# Patient Record
Sex: Female | Born: 1965 | Race: Black or African American | Hispanic: No | Smoking: Former smoker
Health system: Southern US, Community
[De-identification: ages and names within clinical notes are randomized; demographics above are authoritative.]

## PROBLEM LIST (undated history)

## (undated) DIAGNOSIS — E785 Hyperlipidemia, unspecified: Secondary | ICD-10-CM

## (undated) DIAGNOSIS — J40 Bronchitis, not specified as acute or chronic: Secondary | ICD-10-CM

## (undated) DIAGNOSIS — I10 Essential (primary) hypertension: Secondary | ICD-10-CM

## (undated) DIAGNOSIS — I639 Cerebral infarction, unspecified: Secondary | ICD-10-CM

## (undated) DIAGNOSIS — J45909 Unspecified asthma, uncomplicated: Secondary | ICD-10-CM

## (undated) HISTORY — PX: TUBAL LIGATION: SHX77

## (undated) HISTORY — PX: CHOLECYSTECTOMY: SHX55

---

## 2007-02-26 ENCOUNTER — Ambulatory Visit: Payer: Self-pay | Admitting: Obstetrics and Gynecology

## 2008-03-18 ENCOUNTER — Ambulatory Visit: Payer: Self-pay | Admitting: Family Medicine

## 2009-04-15 ENCOUNTER — Ambulatory Visit: Payer: Self-pay | Admitting: Family

## 2010-11-21 ENCOUNTER — Emergency Department: Payer: Self-pay | Admitting: Emergency Medicine

## 2010-12-11 ENCOUNTER — Emergency Department: Payer: Self-pay | Admitting: Emergency Medicine

## 2011-01-07 ENCOUNTER — Emergency Department: Payer: Self-pay | Admitting: *Deleted

## 2011-01-22 ENCOUNTER — Emergency Department: Payer: Self-pay | Admitting: Emergency Medicine

## 2012-01-06 ENCOUNTER — Emergency Department: Payer: Self-pay | Admitting: Emergency Medicine

## 2012-01-11 ENCOUNTER — Emergency Department: Payer: Self-pay | Admitting: Emergency Medicine

## 2012-03-26 ENCOUNTER — Emergency Department: Payer: Self-pay | Admitting: Emergency Medicine

## 2012-06-20 ENCOUNTER — Emergency Department: Payer: Self-pay | Admitting: Emergency Medicine

## 2012-06-20 LAB — CBC
MCHC: 34.5 g/dL (ref 32.0–36.0)
RBC: 3.78 10*6/uL — ABNORMAL LOW (ref 3.80–5.20)
RDW: 12.1 % (ref 11.5–14.5)
WBC: 8 10*3/uL (ref 3.6–11.0)

## 2012-06-20 LAB — COMPREHENSIVE METABOLIC PANEL
Albumin: 3.6 g/dL (ref 3.4–5.0)
Anion Gap: 6 — ABNORMAL LOW (ref 7–16)
BUN: 8 mg/dL (ref 7–18)
Bilirubin,Total: 0.3 mg/dL (ref 0.2–1.0)
Chloride: 106 mmol/L (ref 98–107)
Co2: 26 mmol/L (ref 21–32)
Creatinine: 0.85 mg/dL (ref 0.60–1.30)
EGFR (Non-African Amer.): >60
Potassium: 3.2 mmol/L — ABNORMAL LOW (ref 3.5–5.1)
SGPT (ALT): 10 U/L — ABNORMAL LOW (ref 12–78)
Total Protein: 7.1 g/dL (ref 6.4–8.2)

## 2012-06-20 LAB — URINALYSIS, COMPLETE
Bacteria: NONE SEEN
Bilirubin,UR: NEGATIVE
Ketone: NEGATIVE
Nitrite: NEGATIVE
Ph: 6 (ref 4.5–8.0)
RBC,UR: 5 /HPF (ref 0–5)
WBC UR: 1 /HPF (ref 0–5)

## 2012-06-20 LAB — CK TOTAL AND CKMB (NOT AT ARMC)
CK, Total: 124 U/L (ref 21–215)
CK-MB: 1.1 ng/mL (ref 0.5–3.6)

## 2012-06-20 LAB — TROPONIN I: Troponin-I: <0.02 ng/mL

## 2012-06-21 LAB — TSH: Thyroid Stimulating Horm: 1.26 u[IU]/mL

## 2012-09-18 ENCOUNTER — Emergency Department: Payer: Self-pay | Admitting: Emergency Medicine

## 2012-09-18 LAB — COMPREHENSIVE METABOLIC PANEL
Albumin: 4 g/dL (ref 3.4–5.0)
BUN: 9 mg/dL (ref 7–18)
Bilirubin,Total: 0.4 mg/dL (ref 0.2–1.0)
Co2: 33 mmol/L — ABNORMAL HIGH (ref 21–32)
EGFR (African American): >60
EGFR (Non-African Amer.): >60
Osmolality: 277 (ref 275–301)
Potassium: 2.7 mmol/L — ABNORMAL LOW (ref 3.5–5.1)
SGOT(AST): 25 U/L (ref 15–37)
Total Protein: 8 g/dL (ref 6.4–8.2)

## 2012-09-18 LAB — URINALYSIS, COMPLETE
Bilirubin,UR: NEGATIVE
Leukocyte Esterase: NEGATIVE
Nitrite: NEGATIVE
Ph: 7 (ref 4.5–8.0)
Protein: NEGATIVE
RBC,UR: 10 /HPF (ref 0–5)
Squamous Epithelial: 7
WBC UR: 4 /HPF (ref 0–5)

## 2012-09-18 LAB — CBC
HCT: 41.1 % (ref 35.0–47.0)
HGB: 14.3 g/dL (ref 12.0–16.0)
MCHC: 34.9 g/dL (ref 32.0–36.0)
MCV: 96 fL (ref 80–100)
Platelet: 248 10*3/uL (ref 150–440)
RBC: 4.29 10*6/uL (ref 3.80–5.20)

## 2013-04-20 ENCOUNTER — Emergency Department: Payer: Self-pay | Admitting: Internal Medicine

## 2013-04-20 LAB — RAPID INFLUENZA A&B ANTIGENS (ARMC ONLY)

## 2013-09-29 ENCOUNTER — Emergency Department: Payer: Self-pay | Admitting: Emergency Medicine

## 2013-10-11 ENCOUNTER — Emergency Department: Payer: Self-pay | Admitting: Emergency Medicine

## 2014-03-19 ENCOUNTER — Emergency Department: Payer: Self-pay | Admitting: Emergency Medicine

## 2014-03-19 LAB — CBC WITH DIFFERENTIAL/PLATELET
BASOS PCT: 0.5 %
Basophil #: 0 10*3/uL (ref 0.0–0.1)
EOS ABS: 0.1 10*3/uL (ref 0.0–0.7)
EOS PCT: 0.8 %
HCT: 37.8 % (ref 35.0–47.0)
HGB: 12.7 g/dL (ref 12.0–16.0)
Lymphocyte #: 2.7 10*3/uL (ref 1.0–3.6)
Lymphocyte %: 36.7 %
MCH: 33.3 pg (ref 26.0–34.0)
MCHC: 33.6 g/dL (ref 32.0–36.0)
MCV: 99 fL (ref 80–100)
MONO ABS: 0.5 x10 3/mm (ref 0.2–0.9)
Monocyte %: 7 %
NEUTROS ABS: 4.1 10*3/uL (ref 1.4–6.5)
NEUTROS PCT: 55 %
Platelet: 191 10*3/uL (ref 150–440)
RBC: 3.81 10*6/uL (ref 3.80–5.20)
RDW: 12.3 % (ref 11.5–14.5)
WBC: 7.4 10*3/uL (ref 3.6–11.0)

## 2014-03-19 LAB — URINALYSIS, COMPLETE
BILIRUBIN, UR: NEGATIVE
Bacteria: NONE SEEN
Glucose,UR: NEGATIVE mg/dL (ref 0–75)
Ketone: NEGATIVE
LEUKOCYTE ESTERASE: NEGATIVE
Nitrite: NEGATIVE
PROTEIN: NEGATIVE
Ph: 6 (ref 4.5–8.0)
RBC,UR: 15 /HPF (ref 0–5)
Specific Gravity: 1.02 (ref 1.003–1.030)
WBC UR: 1 /HPF (ref 0–5)

## 2014-03-19 LAB — BASIC METABOLIC PANEL
Anion Gap: 3 — ABNORMAL LOW (ref 7–16)
BUN: 10 mg/dL (ref 7–18)
CHLORIDE: 109 mmol/L — AB (ref 98–107)
CREATININE: 0.8 mg/dL (ref 0.60–1.30)
Calcium, Total: 8.7 mg/dL (ref 8.5–10.1)
Co2: 29 mmol/L (ref 21–32)
EGFR (African American): >60
EGFR (Non-African Amer.): >60
Glucose: 89 mg/dL (ref 65–99)
Osmolality: 280 (ref 275–301)
POTASSIUM: 3.8 mmol/L (ref 3.5–5.1)
Sodium: 141 mmol/L (ref 136–145)

## 2014-07-11 ENCOUNTER — Encounter: Payer: Self-pay | Admitting: Emergency Medicine

## 2014-07-11 ENCOUNTER — Emergency Department: Payer: No Typology Code available for payment source

## 2014-07-11 ENCOUNTER — Emergency Department
Admission: EM | Admit: 2014-07-11 | Discharge: 2014-07-11 | Disposition: A | Discharge status: Home / Self Care | Payer: No Typology Code available for payment source | Attending: Emergency Medicine | Admitting: Emergency Medicine

## 2014-07-11 DIAGNOSIS — Z79899 Other long term (current) drug therapy: Secondary | ICD-10-CM | POA: Diagnosis not present

## 2014-07-11 DIAGNOSIS — M25562 Pain in left knee: Secondary | ICD-10-CM

## 2014-07-11 DIAGNOSIS — Z87891 Personal history of nicotine dependence: Secondary | ICD-10-CM | POA: Insufficient documentation

## 2014-07-11 DIAGNOSIS — Z791 Long term (current) use of non-steroidal anti-inflammatories (NSAID): Secondary | ICD-10-CM | POA: Insufficient documentation

## 2014-07-11 MED ORDER — OXYCODONE-ACETAMINOPHEN 5-325 MG PO TABS: 1.0000 | ORAL_TABLET | Freq: Three times a day (TID) | ORAL | Status: DC | PRN

## 2014-07-11 MED ORDER — OXYCODONE-ACETAMINOPHEN 5-325 MG PO TABS
1.0000 | ORAL_TABLET | Freq: Once | ORAL | Status: AC
  Administered 2014-07-11: 1 via ORAL

## 2014-07-11 MED ORDER — IBUPROFEN 800 MG PO TABS: 800.0000 mg | ORAL_TABLET | Freq: Three times a day (TID) | ORAL | Status: DC | PRN

## 2014-07-11 MED ORDER — OXYCODONE-ACETAMINOPHEN 5-325 MG PO TABS
ORAL_TABLET | ORAL | Status: AC
  Administered 2014-07-11: 1 via ORAL
  Filled 2014-07-11: qty 1
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
## 2014-07-11 NOTE — Discharge Instructions (Signed)
Take medication as prescribed. Keep knee in an immobilizer and use crutches. Apply ice and elevate. Rest.  Follow-up orthopedic next week. See above to call to schedule.  Gen. the ER for new or worsening concerns.  Knee Pain The knee is the complex joint between your thigh and your lower leg. It is made up of bones, tendons, ligaments, and cartilage. The bones that make up the knee are:  The femur in the thigh.  The tibia and fibula in the lower leg.  The patella or kneecap riding in the groove on the lower femur. CAUSES  Knee pain is a common complaint with many causes. A few of these causes are:  Injury, such as:  A ruptured ligament or tendon injury.  Torn cartilage.  Medical conditions, such as:  Gout  Arthritis  Infections  Overuse, over training, or overdoing a physical activity. Knee pain can be minor or severe. Knee pain can accompany debilitating injury. Minor knee problems often respond well to self-care measures or get well on their own. More serious injuries may need medical intervention or even surgery. SYMPTOMS The knee is complex. Symptoms of knee problems can vary widely. Some of the problems are:  Pain with movement and weight bearing.  Swelling and tenderness.  Buckling of the knee.  Inability to straighten or extend your knee.  Your knee locks and you cannot straighten it.  Warmth and redness with pain and fever.  Deformity or dislocation of the kneecap. DIAGNOSIS  Determining what is wrong may be very straight forward such as when there is an injury. It can also be challenging because of the complexity of the knee. Tests to make a diagnosis may include:  Your caregiver taking a history and doing a physical exam.  Routine X-rays can be used to rule out other problems. X-rays will not reveal a cartilage tear. Some injuries of the knee can be diagnosed by:  Arthroscopy a surgical technique by which a small video camera is inserted through tiny  incisions on the sides of the knee. This procedure is used to examine and repair internal knee joint problems. Tiny instruments can be used during arthroscopy to repair the torn knee cartilage (meniscus).  Arthrography is a radiology technique. A contrast liquid is directly injected into the knee joint. Internal structures of the knee joint then become visible on X-ray film.  An MRI scan is a non X-ray radiology procedure in which magnetic fields and a computer produce two- or three-dimensional images of the inside of the knee. Cartilage tears are often visible using an MRI scanner. MRI scans have largely replaced arthrography in diagnosing cartilage tears of the knee.  Blood work.  Examination of the fluid that helps to lubricate the knee joint (synovial fluid). This is done by taking a sample out using a needle and a syringe. TREATMENT The treatment of knee problems depends on the cause. Some of these treatments are:  Depending on the injury, proper casting, splinting, surgery, or physical therapy care will be needed.  Give yourself adequate recovery time. Do not overuse your joints. If you begin to get sore during workout routines, back off. Slow down or do fewer repetitions.  For repetitive activities such as cycling or running, maintain your strength and nutrition.  Alternate muscle groups. For example, if you are a weight lifter, work the upper body on one day and the lower body the next.  Either tight or weak muscles do not give the proper support for your knee. Tight  or weak muscles do not absorb the stress placed on the knee joint. Keep the muscles surrounding the knee strong.  Take care of mechanical problems.  If you have flat feet, orthotics or special shoes may help. See your caregiver if you need help.  Arch supports, sometimes with wedges on the inner or outer aspect of the heel, can help. These can shift pressure away from the side of the knee most bothered by  osteoarthritis.  A brace called an "unloader" brace also may be used to help ease the pressure on the most arthritic side of the knee.  If your caregiver has prescribed crutches, braces, wraps or ice, use as directed. The acronym for this is PRICE. This means protection, rest, ice, compression, and elevation.  Nonsteroidal anti-inflammatory drugs (NSAIDs), can help relieve pain. But if taken immediately after an injury, they may actually increase swelling. Take NSAIDs with food in your stomach. Stop them if you develop stomach problems. Do not take these if you have a history of ulcers, stomach pain, or bleeding from the bowel. Do not take without your caregiver's approval if you have problems with fluid retention, heart failure, or kidney problems.  For ongoing knee problems, physical therapy may be helpful.  Glucosamine and chondroitin are over-the-counter dietary supplements. Both may help relieve the pain of osteoarthritis in the knee. These medicines are different from the usual anti-inflammatory drugs. Glucosamine may decrease the rate of cartilage destruction.  Injections of a corticosteroid drug into your knee joint may help reduce the symptoms of an arthritis flare-up. They may provide pain relief that lasts a few months. You may have to wait a few months between injections. The injections do have a small increased risk of infection, water retention, and elevated blood sugar levels.  Hyaluronic acid injected into damaged joints may ease pain and provide lubrication. These injections may work by reducing inflammation. A series of shots may give relief for as long as 6 months.  Topical painkillers. Applying certain ointments to your skin may help relieve the pain and stiffness of osteoarthritis. Ask your pharmacist for suggestions. Many over the-counter products are approved for temporary relief of arthritis pain.  In some countries, doctors often prescribe topical NSAIDs for relief of  chronic conditions such as arthritis and tendinitis. A review of treatment with NSAID creams found that they worked as well as oral medications but without the serious side effects. PREVENTION  Maintain a healthy weight. Extra pounds put more strain on your joints.  Get strong, stay limber. Weak muscles are a common cause of knee injuries. Stretching is important. Include flexibility exercises in your workouts.  Be smart about exercise. If you have osteoarthritis, chronic knee pain or recurring injuries, you may need to change the way you exercise. This does not mean you have to stop being active. If your knees ache after jogging or playing basketball, consider switching to swimming, water aerobics, or other low-impact activities, at least for a few days a week. Sometimes limiting high-impact activities will provide relief.  Make sure your shoes fit well. Choose footwear that is right for your sport.  Protect your knees. Use the proper gear for knee-sensitive activities. Use kneepads when playing volleyball or laying carpet. Buckle your seat belt every time you drive. Most shattered kneecaps occur in car accidents.  Rest when you are tired. SEEK MEDICAL CARE IF:  You have knee pain that is continual and does not seem to be getting better.  SEEK IMMEDIATE MEDICAL CARE IF:  Your knee joint feels hot to the touch and you have a high fever. MAKE SURE YOU:   Understand these instructions.  Will watch your condition.  Will get help right away if you are not doing well or get worse. Document Released: 12/12/2006 Document Revised: 05/09/2011 Document Reviewed: 12/12/2006 [facility] Patient Information 2015 [place], [place]. This information is not intended to replace advice given to you by your health care provider. Make sure you discuss any questions you have with your health care provider.

## 2014-07-11 NOTE — ED Notes (Signed)
C/o left knee pain, denies any injury, sates she has pain off and on to left knee but worse today

## 2015-04-21 ENCOUNTER — Emergency Department
Admission: EM | Admit: 2015-04-21 | Discharge: 2015-04-21 | Disposition: A | Discharge status: Home / Self Care | Payer: No Typology Code available for payment source | Attending: Emergency Medicine | Admitting: Emergency Medicine

## 2015-04-21 ENCOUNTER — Encounter: Payer: Self-pay | Admitting: Emergency Medicine

## 2015-04-21 DIAGNOSIS — Z87891 Personal history of nicotine dependence: Secondary | ICD-10-CM | POA: Insufficient documentation

## 2015-04-21 DIAGNOSIS — R112 Nausea with vomiting, unspecified: Secondary | ICD-10-CM

## 2015-04-21 DIAGNOSIS — R197 Diarrhea, unspecified: Secondary | ICD-10-CM

## 2015-04-21 DIAGNOSIS — B349 Viral infection, unspecified: Secondary | ICD-10-CM | POA: Insufficient documentation

## 2015-04-21 DIAGNOSIS — Z79899 Other long term (current) drug therapy: Secondary | ICD-10-CM | POA: Insufficient documentation

## 2015-04-21 LAB — COMPREHENSIVE METABOLIC PANEL
ALBUMIN: 4 g/dL (ref 3.5–5.0)
ALT: 9 U/L — AB (ref 14–54)
AST: 20 U/L (ref 15–41)
Alkaline Phosphatase: 97 U/L (ref 38–126)
Anion gap: 5 (ref 5–15)
BUN: 8 mg/dL (ref 6–20)
CO2: 28 mmol/L (ref 22–32)
CREATININE: 0.92 mg/dL (ref 0.44–1.00)
Calcium: 9.1 mg/dL (ref 8.9–10.3)
Chloride: 105 mmol/L (ref 101–111)
GFR calc Af Amer: >60 mL/min (ref >60)
Glucose, Bld: 108 mg/dL — ABNORMAL HIGH (ref 65–99)
Potassium: 3.1 mmol/L — ABNORMAL LOW (ref 3.5–5.1)
Sodium: 138 mmol/L (ref 135–145)
Total Bilirubin: 0.5 mg/dL (ref 0.3–1.2)
Total Protein: 7.3 g/dL (ref 6.5–8.1)

## 2015-04-21 LAB — CBC
HEMATOCRIT: 36.9 % (ref 35.0–47.0)
Hemoglobin: 13.1 g/dL (ref 12.0–16.0)
MCH: 33.8 pg (ref 26.0–34.0)
MCHC: 35.5 g/dL (ref 32.0–36.0)
MCV: 95.3 fL (ref 80.0–100.0)
PLATELETS: 196 10*3/uL (ref 150–440)
RBC: 3.87 MIL/uL (ref 3.80–5.20)
RDW: 12 % (ref 11.5–14.5)
WBC: 4.8 10*3/uL (ref 3.6–11.0)

## 2015-04-21 LAB — URINALYSIS COMPLETE WITH MICROSCOPIC (ARMC ONLY)
Bilirubin Urine: NEGATIVE
GLUCOSE, UA: NEGATIVE mg/dL
Ketones, ur: NEGATIVE mg/dL
LEUKOCYTES UA: NEGATIVE
Nitrite: NEGATIVE
Protein, ur: NEGATIVE mg/dL
Specific Gravity, Urine: 1.017 (ref 1.005–1.030)
pH: 7 (ref 5.0–8.0)

## 2015-04-21 LAB — LIPASE, BLOOD: LIPASE: 24 U/L (ref 11–51)

## 2015-04-21 MED ORDER — POTASSIUM CHLORIDE CRYS ER 20 MEQ PO TBCR
40.0000 meq | EXTENDED_RELEASE_TABLET | Freq: Once | ORAL | Status: AC
  Administered 2015-04-21: 40 meq via ORAL
  Filled 2015-04-21: qty 2

## 2015-04-21 MED ORDER — ONDANSETRON HCL 4 MG PO TABS: ORAL_TABLET | ORAL | Status: DC

## 2015-04-21 MED ORDER — POTASSIUM CHLORIDE CRYS ER 20 MEQ PO TBCR: 20.0000 meq | EXTENDED_RELEASE_TABLET | Freq: Every day | ORAL | Status: DC

## 2015-04-21 NOTE — Discharge Instructions (Signed)
You have been seen in the Emergency Department (ED) today for a likely viral illness.  Please drink plenty of clear fluids (water, Gatorade, chicken broth, etc).  You may use Tylenol and/or Motrin according to label instructions.  You can alternate between the two without any side effects.   Take anti-nausea medication as needed.    Please follow up with your doctor as listed above.  Call your doctor or return to the Emergency Department (ED) if you are unable to tolerate fluids due to vomiting, have worsening trouble breathing, become extremely tired or difficult to awaken, or if you develop any other symptoms that concern you.   Viral Infections A viral infection can be caused by different types of viruses.Most viral infections are not serious and resolve on their own. However, some infections may cause severe symptoms and may lead to further complications. SYMPTOMS Viruses can frequently cause:  Minor sore throat.  Aches and pains.  Headaches.  Runny nose.  Different types of rashes.  Watery eyes.  Tiredness.  Cough.  Loss of appetite.  Gastrointestinal infections, resulting in nausea, vomiting, and diarrhea. These symptoms do not respond to antibiotics because the infection is not caused by bacteria. However, you might catch a bacterial infection following the viral infection. This is sometimes called a "superinfection." Symptoms of such a bacterial infection may include:  Worsening sore throat with pus and difficulty swallowing.  Swollen neck glands.  Chills and a high or persistent fever.  Severe headache.  Tenderness over the sinuses.  Persistent overall ill feeling (malaise), muscle aches, and tiredness (fatigue).  Persistent cough.  Yellow, green, or brown mucus production with coughing. HOME CARE INSTRUCTIONS   Only take over-the-counter or prescription medicines for pain, discomfort, diarrhea, or fever as directed by your caregiver.  Drink enough water  and fluids to keep your urine clear or pale yellow. Sports drinks can provide valuable electrolytes, sugars, and hydration.  Get plenty of rest and maintain proper nutrition. Soups and broths with crackers or rice are fine. SEEK IMMEDIATE MEDICAL CARE IF:   You have severe headaches, shortness of breath, chest pain, neck pain, or an unusual rash.  You have uncontrolled vomiting, diarrhea, or you are unable to keep down fluids.  You or your child has an oral temperature above 102 F (38.9 C), not controlled by medicine.  Your baby is older than 3 months with a rectal temperature of 102 F (38.9 C) or higher.  Your baby is 36 months old or younger with a rectal temperature of 100.4 F (38 C) or higher. MAKE SURE YOU:   Understand these instructions.  Will watch your condition.  Will get help right away if you are not doing well or get worse.   This information is not intended to replace advice given to you by your health care provider. Make sure you discuss any questions you have with your health care provider.   Document Released: 11/24/2004 Document Revised: 05/09/2011 Document Reviewed: 07/23/2014 Elsevier Interactive Patient Education 2016 Else[employer] Inc[employer]  Nausea and Vomiting Nausea is a sick feeling that often comes before throwing up (vomiting). Vomiting is a reflex where stomach contents come out of your mouth. Vomiting can cause severe loss of body fluids (dehydration). Children and elderly adults can become dehydrated quickly, especially if they also have diarrhea. Nausea and vomiting are symptoms of a condition or disease. It is important to find the cause of your symptoms. CAUSES   Direct irritation of the stomach lining. This irritation can  result from increased acid production (gastroesophageal reflux disease), infection, food poisoning, taking certain medicines (such as nonsteroidal anti-inflammatory drugs), alcohol use, or tobacco use.  Signals from the brain.These  signals could be caused by a headache, heat exposure, an inner ear disturbance, increased pressure in the brain from injury, infection, a tumor, or a concussion, pain, emotional stimulus, or metabolic problems.  An obstruction in the gastrointestinal tract (bowel obstruction).  Illnesses such as diabetes, hepatitis, gallbladder problems, appendicitis, kidney problems, cancer, sepsis, atypical symptoms of a heart attack, or eating disorders.  Medical treatments such as chemotherapy and radiation.  Receiving medicine that makes you sleep (general anesthetic) during surgery. DIAGNOSIS Your caregiver may ask for tests to be done if the problems do not improve after a few days. Tests may also be done if symptoms are severe or if the reason for the nausea and vomiting is not clear. Tests may include:  Urine tests.  Blood tests.  Stool tests.  Cultures (to look for evidence of infection).  X-rays or other imaging studies. Test results can help your caregiver make decisions about treatment or the need for additional tests. TREATMENT You need to stay well hydrated. Drink frequently but in small amounts.You may wish to drink water, sports drinks, clear broth, or eat frozen ice pops or gelatin dessert to help stay hydrated.When you eat, eating slowly may help prevent nausea.There are also some antinausea medicines that may help prevent nausea. HOME CARE INSTRUCTIONS   Take all medicine as directed by your caregiver.  If you do not have an appetite, do not force yourself to eat. However, you must continue to drink fluids.  If you have an appetite, eat a normal diet unless your caregiver tells you differently.  Eat a variety of complex carbohydrates (rice, wheat, potatoes, bread), lean meats, yogurt, fruits, and vegetables.  Avoid high-fat foods because they are more difficult to digest.  Drink enough water and fluids to keep your urine clear or pale yellow.  If you are dehydrated, ask  your caregiver for specific rehydration instructions. Signs of dehydration may include:  Severe thirst.  Dry lips and mouth.  Dizziness.  Dark urine.  Decreasing urine frequency and amount.  Confusion.  Rapid breathing or pulse. SEEK IMMEDIATE MEDICAL CARE IF:   You have blood or brown flecks (like coffee grounds) in your vomit.  You have black or bloody stools.  You have a severe headache or stiff neck.  You are confused.  You have severe abdominal pain.  You have chest pain or trouble breathing.  You do not urinate at least once every 8 hours.  You develop cold or clammy skin.  You continue to vomit for longer than 24 to 48 hours.  You have a fever. MAKE SURE YOU:   Understand these instructions.  Will watch your condition.  Will get help right away if you are not doing well or get worse.   This information is not intended to replace advice given to you by your health care provider. Make sure you discuss any questions you have with your health care provider.   Document Released: 02/14/2005 Document Revised: 05/09/2011 Document Reviewed: 07/14/2010 Elsevier Interactive Patient Education 2016 Elsevier Inc.  Diarrhea Diarrhea is frequent loose and watery bowel movements. It can cause you to feel weak and dehydrated. Dehydration can cause you to become tired and thirsty, have a dry mouth, and have decreased urination that often is dark yellow. Diarrhea is a sign of another problem, most often an infection  that will not last long. In most cases, diarrhea typically lasts 2-3 days. However, it can last longer if it is a sign of something more serious. It is important to treat your diarrhea as directed by your caregiver to lessen or prevent future episodes of diarrhea. CAUSES  Some common causes include:  Gastrointestinal infections caused by viruses, bacteria, or parasites.  Food poisoning or food allergies.  Certain medicines, such as antibiotics, chemotherapy,  and laxatives.  Artificial sweeteners and fructose.  Digestive disorders. HOME CARE INSTRUCTIONS  Ensure adequate fluid intake (hydration): Have 1 cup (8 oz) of fluid for each diarrhea episode. Avoid fluids that contain simple sugars or sports drinks, fruit juices, whole milk products, and sodas. Your urine should be clear or pale yellow if you are drinking enough fluids. Hydrate with an oral rehydration solution that you can purchase at pharmacies, retail stores, and online. You can prepare an oral rehydration solution at home by mixing the following ingredients together:   - tsp table salt.   tsp baking soda.   tsp salt substitute containing potassium chloride.  1  tablespoons sugar.  1 L (34 oz) of water.  Certain foods and beverages may increase the speed at which food moves through the gastrointestinal (GI) tract. These foods and beverages should be avoided and include:  Caffeinated and alcoholic beverages.  High-fiber foods, such as raw fruits and vegetables, nuts, seeds, and whole grain breads and cereals.  Foods and beverages sweetened with sugar alcohols, such as xylitol, sorbitol, and mannitol.  Some foods may be well tolerated and may help thicken stool including:  Starchy foods, such as rice, toast, pasta, low-sugar cereal, oatmeal, grits, baked potatoes, crackers, and bagels.  Bananas.  Applesauce.  Add probiotic-rich foods to help increase healthy bacteria in the GI tract, such as yogurt and fermented milk products.  Wash your hands well after each diarrhea episode.  Only take over-the-counter or prescription medicines as directed by your caregiver.  Take a warm bath to relieve any burning or pain from frequent diarrhea episodes. SEEK IMMEDIATE MEDICAL CARE IF:   You are unable to keep fluids down.  You have persistent vomiting.  You have blood in your stool, or your stools are black and tarry.  You do not urinate in 6-8 hours, or there is only a small  amount of very dark urine.  You have abdominal pain that increases or localizes.  You have weakness, dizziness, confusion, or light-headedness.  You have a severe headache.  Your diarrhea gets worse or does not get better.  You have a fever or persistent symptoms for more than 2-3 days.  You have a fever and your symptoms suddenly get worse. MAKE SURE YOU:   Understand these instructions.  Will watch your condition.  Will get help right away if you are not doing well or get worse.   This information is not intended to replace advice given to you by your health care provider. Make sure you discuss any questions you have with your health care provider.   Document Released: 02/04/2002 Document Revised: 03/07/2014 Document Reviewed: 10/23/2011 Elsevier Interactive Patient Education [employer]

## 2015-04-21 NOTE — ED Notes (Signed)
Pt pt presents to ED with frequent vomiting and diarrhea X4 and generalized body aches. Pt reports onset of symptoms was around 1500 this afternoon.

## 2015-04-21 NOTE — ED Provider Notes (Signed)
[facility] Emergency Department Provider Note  ____________________________________________  Time seen: Approximately 6:47 AM  I have reviewed the triage vital signs and the nursing notes.   HISTORY  Chief Complaint Emesis    HPI Patricia [name] is a 50 y.o. female with no significant past medical history who presents with less than 24 hours of gradual onset of generalized body aches, subjective fever, and multiple episodes of vomiting and diarrhea (4).  She also states that she feels some nasal congestion and has a mild nonproductive cough, but that is not unusual for her because she is a former smoker and frequently gets bronchitis.  She has had a mild global headache with no sensitivity to light and no visual disturbances.  She has no neck pain or stiffness.  She denies chest pain, abdominal pain, dysuria.  Nothing makes her symptoms better and nothing makes them worse.Multiple people around her have been ill recently with similar symptoms.  She does not ever get the influenza vaccination per her own report.   History reviewed. No pertinent past medical history.  There are no active problems to display for this patient.   Past Surgical History  Procedure Laterality Date  . Cholecystectomy    . Tubal ligation      Current Outpatient Rx  Name  Route  Sig  Dispense  Refill  . hydrochlorothiazide (HYDRODIURIL) 12.5 MG tablet   Oral   Take 12.5 mg by mouth daily.          Marland Kitchen ibuprofen (ADVIL,MOTRIN) 800 MG tablet   Oral   Take 1 tablet (800 mg total) by mouth every 8 (eight) hours as needed for mild pain or moderate pain.   15 tablet   0   . ondansetron (ZOFRAN) 4 MG tablet      Take 1-2 tabs by mouth every 8 hours as needed for nausea/vomiting   30 tablet   0   . oxyCODONE-acetaminophen (ROXICET) 5-325 MG per tablet   Oral   Take 1 tablet by mouth every 8 (eight) hours as needed for moderate pain or severe pain (Do not drive or operate heavy  machinery while taking as can cause drowsiness.).   9 tablet   0   . potassium chloride SA (KLOR-CON M20) 20 MEQ tablet   Oral   Take 1 tablet (20 mEq total) by mouth daily.   7 tablet   0     Allergies Review of patient's allergies indicates no known allergies.  No family history on file.  Social History Social History  Substance Use Topics  . Smoking status: Former Games developer  . Smokeless tobacco: None  . Alcohol Use: Yes    Review of Systems Constitutional: Subjective fever/chills with general myalgias and malaise Eyes: No visual changes. ENT: No sore throat.  Mild congestion Cardiovascular: Denies chest pain. Respiratory: Denies shortness of breath.  Occasional mild nonproductive cough Gastrointestinal: No abdominal pain.  4 episodes of both vomiting and diarrhea since yesterday afternoon.  No constipation. Genitourinary: Negative for dysuria. Musculoskeletal: Negative for back pain. Skin: Negative for rash. Neurological: Negative for headaches, focal weakness or numbness.  General malaise and fatigue  10-point ROS otherwise negative.  ____________________________________________   PHYSICAL EXAM:  VITAL SIGNS: ED Triage Vitals  Enc Vitals Group     BP 04/21/15 0056 108/77 mmHg     Pulse Rate 04/21/15 0056 98     Resp 04/21/15 0056 20     Temp 04/21/15 0056 98.5 F (36.9 C)  Temp Source 04/21/15 0056 Oral     SpO2 04/21/15 0056 98 %     Weight 04/21/15 0056 176 lb (79.833 kg)     Height 04/21/15 0056  (1.651 m)     Head Cir --      Peak Flow --      Pain Score 04/21/15 0057 10     Pain Loc --      Pain Edu? --      Excl. in GC? --     Constitutional: Alert and oriented.  Appears uncomfortable but nontoxic and in no acute distress Eyes: Conjunctivae are normal. PERRL. EOMI. Head: Atraumatic. Nose: Mild congestion/rhinnorhea. Mouth/Throat: Mucous membranes are moist.  Oropharynx non-erythematous. Neck: No stridor.  No meningeal  signs Cardiovascular: Normal rate, regular rhythm. Grossly normal heart sounds.  Good peripheral circulation. Respiratory: Normal respiratory effort.  No retractions. Lungs CTAB. Gastrointestinal: Soft and nontender. No distention. No abdominal bruits. No CVA tenderness. Musculoskeletal: No lower extremity tenderness nor edema.  No joint effusions. Neurologic:  Normal speech and language. No gross focal neurologic deficits are appreciated.  Skin:  Skin is warm, dry and intact. No rash noted.   ____________________________________________   LABS (all labs ordered are listed, but only abnormal results are displayed)  Labs Reviewed  COMPREHENSIVE METABOLIC PANEL - Abnormal; Notable for the following:    Potassium 3.1 (*)    Glucose, Bld 108 (*)    ALT 9 (*)    All other components within normal limits  URINALYSIS COMPLETEWITH MICROSCOPIC ([facility] ONLY) - Abnormal; Notable for the following:    Color, Urine AMBER (*)    APPearance CLEAR (*)    Hgb urine dipstick 2+ (*)    Bacteria, UA RARE (*)    Squamous Epithelial / LPF 0-5 (*)    All other components within normal limits  LIPASE, BLOOD  CBC   ____________________________________________  EKG  None ____________________________________________  RADIOLOGY   No results found.  ____________________________________________   PROCEDURES  Procedure(s) performed: None  Critical Care performed: No ____________________________________________   INITIAL IMPRESSION / ASSESSMENT AND PLAN / ED COURSE  Pertinent labs & imaging results that were available during my care of the patient were reviewed by me and considered in my medical decision making (see chart for details).  Her labs are reassuring except for a low potassium which is likely due to GI losses.  I believe that she has a viral symptom, most likely a respiratory virus that is also causing gastroenteritis.  She has not had any episodes of vomiting or diarrhea during  approximately 6 hours in the emergency department.  She is able to tolerate by mouth intake.  I repleted her potassium with 40 mEq of potassium chloride and gave her prescription for a few more doses in case her symptoms continue.  I stressed the importance of conservative outpatient management with plenty of IV fluids, over-the-counter analgesia and antipyretics as needed, and close outpatient follow-up.   I gave my usual and customary return precautions.   She understands and agrees with the plan.  ____________________________________________  FINAL CLINICAL IMPRESSION(S) / ED DIAGNOSES  Final diagnoses:  Viral syndrome  Nausea and vomiting, vomiting of unspecified type  Diarrhea, unspecified type      NEW MEDICATIONS STARTED DURING THIS VISIT:  Discharge Medication List as of 04/21/2015  7:12 AM    START taking these medications   Details  ondansetron (ZOFRAN) 4 MG tablet Take 1-2 tabs by mouth every 8 hours as needed for  nausea/vomiting, Print    potassium chloride SA (KLOR-CON M20) 20 MEQ tablet Take 1 tablet (20 mEq total) by mouth daily., Starting 04/21/2015, Until Discontinued, Print         Loleta Rose, MD 04/21/15 1013

## 2015-04-23 ENCOUNTER — Encounter: Payer: Self-pay | Admitting: Emergency Medicine

## 2015-04-23 ENCOUNTER — Emergency Department
Admission: EM | Admit: 2015-04-23 | Discharge: 2015-04-23 | Disposition: A | Discharge status: Home / Self Care | Payer: Self-pay | Attending: Emergency Medicine | Admitting: Emergency Medicine

## 2015-04-23 ENCOUNTER — Emergency Department: Payer: Self-pay

## 2015-04-23 DIAGNOSIS — E876 Hypokalemia: Secondary | ICD-10-CM | POA: Insufficient documentation

## 2015-04-23 DIAGNOSIS — I10 Essential (primary) hypertension: Secondary | ICD-10-CM | POA: Insufficient documentation

## 2015-04-23 DIAGNOSIS — J45909 Unspecified asthma, uncomplicated: Secondary | ICD-10-CM | POA: Insufficient documentation

## 2015-04-23 DIAGNOSIS — J069 Acute upper respiratory infection, unspecified: Secondary | ICD-10-CM | POA: Insufficient documentation

## 2015-04-23 DIAGNOSIS — F17200 Nicotine dependence, unspecified, uncomplicated: Secondary | ICD-10-CM | POA: Insufficient documentation

## 2015-04-23 DIAGNOSIS — Z79899 Other long term (current) drug therapy: Secondary | ICD-10-CM | POA: Insufficient documentation

## 2015-04-23 HISTORY — DX: Essential (primary) hypertension: I10

## 2015-04-23 HISTORY — DX: Unspecified asthma, uncomplicated: J45.909

## 2015-04-23 LAB — BASIC METABOLIC PANEL
ANION GAP: 9 (ref 5–15)
BUN: 14 mg/dL (ref 6–20)
CO2: 26 mmol/L (ref 22–32)
Calcium: 9 mg/dL (ref 8.9–10.3)
Chloride: 101 mmol/L (ref 101–111)
Creatinine, Ser: 0.88 mg/dL (ref 0.44–1.00)
GFR calc Af Amer: >60 mL/min (ref >60)
GLUCOSE: 116 mg/dL — AB (ref 65–99)
POTASSIUM: 2.9 mmol/L — AB (ref 3.5–5.1)
Sodium: 136 mmol/L (ref 135–145)

## 2015-04-23 LAB — CBC WITH DIFFERENTIAL/PLATELET
BASOS ABS: 0 10*3/uL (ref 0–0.1)
Basophils Relative: 0 %
Eosinophils Absolute: 0 10*3/uL (ref 0–0.7)
Eosinophils Relative: 0 %
HEMATOCRIT: 37.9 % (ref 35.0–47.0)
HEMOGLOBIN: 13.4 g/dL (ref 12.0–16.0)
LYMPHS PCT: 29 %
Lymphs Abs: 2.3 10*3/uL (ref 1.0–3.6)
MCH: 34 pg (ref 26.0–34.0)
MCHC: 35.5 g/dL (ref 32.0–36.0)
MCV: 96 fL (ref 80.0–100.0)
Monocytes Absolute: 0.7 10*3/uL (ref 0.2–0.9)
Monocytes Relative: 9 %
NEUTROS ABS: 4.8 10*3/uL (ref 1.4–6.5)
NEUTROS PCT: 62 %
Platelets: 187 10*3/uL (ref 150–440)
RBC: 3.95 MIL/uL (ref 3.80–5.20)
RDW: 12.4 % (ref 11.5–14.5)
WBC: 7.9 10*3/uL (ref 3.6–11.0)

## 2015-04-23 LAB — URINALYSIS COMPLETE WITH MICROSCOPIC (ARMC ONLY)
BILIRUBIN URINE: NEGATIVE
Bacteria, UA: NONE SEEN
Glucose, UA: NEGATIVE mg/dL
KETONES UR: NEGATIVE mg/dL
Leukocytes, UA: NEGATIVE
Nitrite: NEGATIVE
Protein, ur: NEGATIVE mg/dL
Specific Gravity, Urine: 1.012 (ref 1.005–1.030)
pH: 6 (ref 5.0–8.0)

## 2015-04-23 LAB — RAPID INFLUENZA A&B ANTIGENS: Influenza A (ARMC): NOT DETECTED

## 2015-04-23 LAB — RAPID INFLUENZA A&B ANTIGENS (ARMC ONLY): INFLUENZA B (ARMC): NOT DETECTED

## 2015-04-23 LAB — POCT RAPID STREP A: STREPTOCOCCUS, GROUP A SCREEN (DIRECT): NEGATIVE

## 2015-04-23 MED ORDER — IBUPROFEN 800 MG PO TABS: 800.0000 mg | ORAL_TABLET | Freq: Three times a day (TID) | ORAL | Status: DC | PRN

## 2015-04-23 MED ORDER — GUAIFENESIN-CODEINE 100-10 MG/5ML PO SOLN: 10.0000 mL | ORAL | Status: DC | PRN

## 2015-04-23 MED ORDER — OXYCODONE-ACETAMINOPHEN 5-325 MG PO TABS
2.0000 | ORAL_TABLET | Freq: Once | ORAL | Status: AC
  Administered 2015-04-23: 2 via ORAL
  Filled 2015-04-23: qty 2

## 2015-04-23 MED ORDER — POTASSIUM CHLORIDE CRYS ER 20 MEQ PO TBCR
40.0000 meq | EXTENDED_RELEASE_TABLET | Freq: Once | ORAL | Status: AC
Start: 2015-04-23 — End: 2015-04-23
  Administered 2015-04-23: 40 meq via ORAL
  Filled 2015-04-23: qty 2

## 2015-04-23 MED ORDER — AZITHROMYCIN 250 MG PO TABS: ORAL_TABLET | ORAL | Status: DC

## 2015-04-23 NOTE — ED Provider Notes (Signed)
[facility] Emergency Department Provider Note  ____________________________________________  Time seen: Approximately 9:22 PM  I have reviewed the triage vital signs and the nursing notes.   HISTORY  Chief Complaint Sore Throat; Headache; and Generalized Body Aches    HPI Patricia [name] is a 50 y.o. female was seen here less than 48 hours ago with complaints of chills nausea vomiting nasal congestion and nonproductive cough. She was diagnosed with hypokalemia and given the potassium bolus with a prescription for potassium pills. Still complains of severe sore throat, headache, body aches. She reports she did not take flu shot. Unsure of any fever at home but denies any chills.Has not taken anything at home.    Past Medical History  Diagnosis Date  . Asthma   . Hypertension     There are no active problems to display for this patient.   Past Surgical History  Procedure Laterality Date  . Cholecystectomy    . Tubal ligation      Current Outpatient Rx  Name  Route  Sig  Dispense  Refill  . azithromycin (ZITHROMAX Z-PAK) 250 MG tablet      Take 2 tablets (500 mg) on  Day 1,  followed by 1 tablet (250 mg) once daily on Days 2 through 5.   6 each   0   . guaiFENesin-codeine 100-10 MG/5ML syrup   Oral   Take 10 mLs by mouth every 4 (four) hours as needed for cough.   180 mL   0   . hydrochlorothiazide (HYDRODIURIL) 12.5 MG tablet   Oral   Take 12.5 mg by mouth daily.          Marland Kitchen ibuprofen (ADVIL,MOTRIN) 800 MG tablet   Oral   Take 1 tablet (800 mg total) by mouth every 8 (eight) hours as needed.   30 tablet   0   . ondansetron (ZOFRAN) 4 MG tablet      Take 1-2 tabs by mouth every 8 hours as needed for nausea/vomiting   30 tablet   0   . potassium chloride SA (KLOR-CON M20) 20 MEQ tablet   Oral   Take 1 tablet (20 mEq total) by mouth daily.   7 tablet   0     Allergies Review of patient's allergies indicates no known  allergies.  No family history on file.  Social History Social History  Substance Use Topics  . Smoking status: Current Every Day Smoker  . Smokeless tobacco: None  . Alcohol Use: Yes     Comment: occassionaly    Review of Systems Constitutional: Positive fever chills Eyes: No visual changes. ENT: Positive sore throat. Cardiovascular: Denies chest pain. Respiratory: Denies shortness of breath. Positive for cough. Gastrointestinal: No abdominal pain.  No nausea, no vomiting.  No diarrhea.  No constipation. Genitourinary: Negative for dysuria. Musculoskeletal: Positive for generalized fatigue and muscle aches.. Skin: Negative for rash. Neurological: Negative for headaches, focal weakness or numbness.  10-point ROS otherwise negative.  ____________________________________________   PHYSICAL EXAM:  VITAL SIGNS: ED Triage Vitals  Enc Vitals Group     BP 04/23/15 2051 121/82 mmHg     Pulse Rate 04/23/15 2051 104     Resp 04/23/15 2051 18     Temp 04/23/15 2051 98.4 F (36.9 C)     Temp Source 04/23/15 2051 Oral     SpO2 04/23/15 2051 96 %     Weight 04/23/15 2051 170 lb (77.111 kg)     Height 04/23/15  2051  (1.676 m)     Head Cir --      Peak Flow --      Pain Score 04/23/15 2104 10     Pain Loc --      Pain Edu? --      Excl. in GC? --     Constitutional: Alert and oriented. Well appearing and in no acute distress. Patient tearful on exam. Head: Atraumatic. Nose: No congestion/rhinnorhea. Mouth/Throat: Mucous membranes are moist.  Oropharynx non-erythematous. Neck: No stridor.   Cardiovascular: Normal rate, regular rhythm. Grossly normal heart sounds.  Good peripheral circulation. Respiratory: Normal respiratory effort.  No retractions. Lungs CTAB. Gastrointestinal: Soft and nontender. No distention. No abdominal bruits. No CVA tenderness. Musculoskeletal: No lower extremity tenderness nor edema.  No joint effusions. Neurologic:  Normal speech and language.  No gross focal neurologic deficits are appreciated. No gait instability. Skin:  Skin is warm, dry and intact. No rash noted. Psychiatric: Mood and affect are normal. Speech and behavior are normal.  ____________________________________________   LABS (all labs ordered are listed, but only abnormal results are displayed)  Labs Reviewed  BASIC METABOLIC PANEL - Abnormal; Notable for the following:    Potassium 2.9 (*)    Glucose, Bld 116 (*)    All other components within normal limits  URINALYSIS COMPLETEWITH MICROSCOPIC ([facility] ONLY) - Abnormal; Notable for the following:    Color, Urine YELLOW (*)    APPearance CLEAR (*)    Hgb urine dipstick 2+ (*)    Squamous Epithelial / LPF 0-5 (*)    All other components within normal limits  RAPID INFLUENZA A&B ANTIGENS ([facility] ONLY)  CULTURE, GROUP A STREP ([facility])  CBC WITH DIFFERENTIAL/PLATELET  POCT RAPID STREP A   ____________________________________________   RADIOLOGY  Negative ____________________________________________   PROCEDURES  Procedure(s) performed: None  Critical Care performed: No  ____________________________________________   INITIAL IMPRESSION / ASSESSMENT AND PLAN / ED COURSE  Pertinent labs & imaging results that were available during my care of the patient were reviewed by me and considered in my medical decision making (see chart for details).  Acute upper respiratory infection. Hypokalemia. 60 mg by mouth given while in the ED and encouraged patient to get her prescription filled for 48 hours ago. Rx given for Zithromax, Robitussin-AC, Motrin 800 mg 3 times a day, patient to follow up PCP or return to ER with any worsening symptomology. Discussed all clinical findings with the patient voices no other emergency medical complaints and is satisfied at this time. ____________________________________________   FINAL CLINICAL IMPRESSION(S) / ED DIAGNOSES  Final diagnoses:  URI, acute     This chart was  dictated using voice recognition software/Dragon. Despite best efforts to proofread, errors can occur which can change the meaning. Any change was purely unintentional.   Evangeline Dakin, PA-C 04/23/15 2311  Phineas Semen, MD 04/24/15 [phone]

## 2015-04-23 NOTE — ED Notes (Signed)
Pt is not driving ,   She will be riding her sig. Other who is present at bedside.

## 2015-04-23 NOTE — ED Notes (Signed)
Patient with sore throat, headache and body aches that started on Monday. Patient was seen here on Monday for the same symptoms.

## 2015-04-23 NOTE — Discharge Instructions (Signed)
Upper Respiratory Infection, Adult Most upper respiratory infections (URIs) are a viral infection of the air passages leading to the lungs. A URI affects the nose, throat, and upper air passages. The most common type of URI is nasopharyngitis and is typically referred to as "the common cold." URIs run their course and usually go away on their own. Most of the time, a URI does not require medical attention, but sometimes a bacterial infection in the upper airways can follow a viral infection. This is called a secondary infection. Sinus and middle ear infections are common types of secondary upper respiratory infections. Bacterial pneumonia can also complicate a URI. A URI can worsen asthma and chronic obstructive pulmonary disease (COPD). Sometimes, these complications can require emergency medical care and may be life threatening.  CAUSES Almost all URIs are caused by viruses. A virus is a type of germ and can spread from one person to another.  RISKS FACTORS You may be at risk for a URI if:   You smoke.   You have chronic heart or lung disease.  You have a weakened defense (immune) system.   You are very young or very old.   You have nasal allergies or asthma.  You work in crowded or poorly ventilated areas.  You work in health care facilities or schools. SIGNS AND SYMPTOMS  Symptoms typically develop 2-3 days after you come in contact with a cold virus. Most viral URIs last 7-10 days. However, viral URIs from the influenza virus (flu virus) can last 14-18 days and are typically more severe. Symptoms may include:   Runny or stuffy (congested) nose.   Sneezing.   Cough.   Sore throat.   Headache.   Fatigue.   Fever.   Loss of appetite.   Pain in your forehead, behind your eyes, and over your cheekbones (sinus pain).  Muscle aches.  DIAGNOSIS  Your health care provider may diagnose a URI by:  Physical exam.  Tests to check that your symptoms are not due to  another condition such as:  Strep throat.  Sinusitis.  Pneumonia.  Asthma. TREATMENT  A URI goes away on its own with time. It cannot be cured with medicines, but medicines may be prescribed or recommended to relieve symptoms. Medicines may help:  Reduce your fever.  Reduce your cough.  Relieve nasal congestion. HOME CARE INSTRUCTIONS   Take medicines only as directed by your health care provider.   Gargle warm saltwater or take cough drops to comfort your throat as directed by your health care provider.  Use a warm mist humidifier or inhale steam from a shower to increase air moisture. This may make it easier to breathe.  Drink enough fluid to keep your urine clear or pale yellow.   Eat soups and other clear broths and maintain good nutrition.   Rest as needed.   Return to work when your temperature has returned to normal or as your health care provider advises. You may need to stay home longer to avoid infecting others. You can also use a face mask and careful hand washing to prevent spread of the virus.  Increase the usage of your inhaler if you have asthma.   Do not use any tobacco products, including cigarettes, chewing tobacco, or electronic cigarettes. If you need help quitting, ask your health care provider. PREVENTION  The best way to protect yourself from getting a cold is to practice good hygiene.   Avoid oral or hand contact with people with cold   symptoms.   Wash your hands often if contact occurs.  There is no clear evidence that vitamin C, vitamin E, echinacea, or exercise reduces the chance of developing a cold. However, it is always recommended to get plenty of rest, exercise, and practice good nutrition.  SEEK MEDICAL CARE IF:   You are getting worse rather than better.   Your symptoms are not controlled by medicine.   You have chills.  You have worsening shortness of breath.  You have brown or red mucus.  You have yellow or brown nasal  discharge.  You have pain in your face, especially when you bend forward.  You have a fever.  You have swollen neck glands.  You have pain while swallowing.  You have white areas in the back of your throat. SEEK IMMEDIATE MEDICAL CARE IF:   You have severe or persistent:  Headache.  Ear pain.  Sinus pain.  Chest pain.  You have chronic lung disease and any of the following:  Wheezing.  Prolonged cough.  Coughing up blood.  A change in your usual mucus.  You have a stiff neck.  You have changes in your:  Vision.  Hearing.  Thinking.  Mood. MAKE SURE YOU:   Understand these instructions.  Will watch your condition.  Will get help right away if you are not doing well or get worse.   This information is not intended to replace advice given to you by your health care provider. Make sure you discuss any questions you have with your health care provider.   Document Released: 08/10/2000 Document Revised: 07/01/2014 Document Reviewed: 05/22/2013 Elsevier Interactive Patient Education 2016 [employer]  

## 2015-04-25 LAB — CULTURE, GROUP A STREP (THRC)

## 2015-06-03 ENCOUNTER — Emergency Department: Payer: No Typology Code available for payment source

## 2015-06-03 ENCOUNTER — Encounter: Payer: Self-pay | Admitting: Emergency Medicine

## 2015-06-03 ENCOUNTER — Emergency Department
Admission: EM | Admit: 2015-06-03 | Discharge: 2015-06-03 | Disposition: A | Discharge status: Home / Self Care | Payer: No Typology Code available for payment source | Attending: Emergency Medicine | Admitting: Emergency Medicine

## 2015-06-03 DIAGNOSIS — M79604 Pain in right leg: Secondary | ICD-10-CM | POA: Insufficient documentation

## 2015-06-03 DIAGNOSIS — E876 Hypokalemia: Secondary | ICD-10-CM | POA: Insufficient documentation

## 2015-06-03 DIAGNOSIS — Z79899 Other long term (current) drug therapy: Secondary | ICD-10-CM | POA: Insufficient documentation

## 2015-06-03 DIAGNOSIS — F17200 Nicotine dependence, unspecified, uncomplicated: Secondary | ICD-10-CM | POA: Insufficient documentation

## 2015-06-03 DIAGNOSIS — M79605 Pain in left leg: Secondary | ICD-10-CM | POA: Insufficient documentation

## 2015-06-03 DIAGNOSIS — M791 Myalgia, unspecified site: Secondary | ICD-10-CM

## 2015-06-03 DIAGNOSIS — I10 Essential (primary) hypertension: Secondary | ICD-10-CM | POA: Insufficient documentation

## 2015-06-03 DIAGNOSIS — F1290 Cannabis use, unspecified, uncomplicated: Secondary | ICD-10-CM | POA: Insufficient documentation

## 2015-06-03 DIAGNOSIS — J45909 Unspecified asthma, uncomplicated: Secondary | ICD-10-CM | POA: Insufficient documentation

## 2015-06-03 LAB — COMPREHENSIVE METABOLIC PANEL
ALT: 8 U/L — ABNORMAL LOW (ref 14–54)
ANION GAP: 4 — AB (ref 5–15)
AST: 18 U/L (ref 15–41)
Albumin: 3.7 g/dL (ref 3.5–5.0)
Alkaline Phosphatase: 80 U/L (ref 38–126)
BILIRUBIN TOTAL: 0.3 mg/dL (ref 0.3–1.2)
BUN: 14 mg/dL (ref 6–20)
CO2: 27 mmol/L (ref 22–32)
Calcium: 8.7 mg/dL — ABNORMAL LOW (ref 8.9–10.3)
Chloride: 109 mmol/L (ref 101–111)
Creatinine, Ser: 0.83 mg/dL (ref 0.44–1.00)
Glucose, Bld: 89 mg/dL (ref 65–99)
POTASSIUM: 3.3 mmol/L — AB (ref 3.5–5.1)
Sodium: 140 mmol/L (ref 135–145)
TOTAL PROTEIN: 6.6 g/dL (ref 6.5–8.1)

## 2015-06-03 LAB — CBC WITH DIFFERENTIAL/PLATELET
Basophils Absolute: 0 10*3/uL (ref 0–0.1)
Basophils Relative: 1 %
EOS PCT: 1 %
Eosinophils Absolute: 0.1 10*3/uL (ref 0–0.7)
HEMATOCRIT: 34.3 % — AB (ref 35.0–47.0)
Hemoglobin: 12.1 g/dL (ref 12.0–16.0)
LYMPHS PCT: 41 %
Lymphs Abs: 3.4 10*3/uL (ref 1.0–3.6)
MCH: 33.9 pg (ref 26.0–34.0)
MCHC: 35.1 g/dL (ref 32.0–36.0)
MCV: 96.5 fL (ref 80.0–100.0)
MONO ABS: 0.6 10*3/uL (ref 0.2–0.9)
MONOS PCT: 7 %
NEUTROS ABS: 4.2 10*3/uL (ref 1.4–6.5)
Neutrophils Relative %: 50 %
Platelets: 217 10*3/uL (ref 150–440)
RBC: 3.56 MIL/uL — ABNORMAL LOW (ref 3.80–5.20)
RDW: 12.9 % (ref 11.5–14.5)
WBC: 8.3 10*3/uL (ref 3.6–11.0)

## 2015-06-03 LAB — CK: CK TOTAL: 142 U/L (ref 38–234)

## 2015-06-03 MED ORDER — POTASSIUM CHLORIDE CRYS ER 20 MEQ PO TBCR
40.0000 meq | EXTENDED_RELEASE_TABLET | Freq: Once | ORAL | Status: AC
  Administered 2015-06-03: 40 meq via ORAL
  Filled 2015-06-03: qty 2

## 2015-06-03 MED ORDER — TRAMADOL HCL 50 MG PO TABS: 50.0000 mg | ORAL_TABLET | Freq: Four times a day (QID) | ORAL | Status: DC | PRN

## 2015-06-03 MED ORDER — IBUPROFEN 800 MG PO TABS: 800.0000 mg | ORAL_TABLET | Freq: Three times a day (TID) | ORAL | Status: DC | PRN

## 2015-06-03 MED ORDER — KETOROLAC TROMETHAMINE 30 MG/ML IJ SOLN
30.0000 mg | INTRAMUSCULAR | Status: AC
  Administered 2015-06-03: 30 mg via INTRAVENOUS
  Filled 2015-06-03: qty 1

## 2015-06-03 NOTE — ED Notes (Signed)
Pt resting in bed in no acute distress, resp even and unlabored 

## 2015-06-03 NOTE — ED Provider Notes (Signed)
[facility] Emergency Department Provider Note  ____________________________________________  Time seen: Approximately 7:05 AM  I have reviewed the triage vital signs and the nursing notes.   HISTORY  Chief Complaint Leg Swelling    HPI Patricia [name] is a 50 y.o. female complaining of right leg swelling and pain for approximately 1 month. Patient states that she has not been seeing any physicians to have it evaluated. Patient has history of hypertension and asthma. Patient denies any recent travel, numbness or tingling to the leg. Patient does not know of any particular injuries. Patient states on a scale of 0-10 pain is about a 7. Patient states the pain increases with walking and movement of the leg. The pain is primarily in her posterior calf.   Past Medical History  Diagnosis Date  . Asthma   . Hypertension     There are no active problems to display for this patient.   Past Surgical History  Procedure Laterality Date  . Cholecystectomy    . Tubal ligation      Current Outpatient Rx  Name  Route  Sig  Dispense  Refill  . hydrochlorothiazide (HYDRODIURIL) 12.5 MG tablet   Oral   Take 12.5 mg by mouth daily.          Marland Kitchen ibuprofen (ADVIL,MOTRIN) 800 MG tablet   Oral   Take 1 tablet (800 mg total) by mouth every 8 (eight) hours as needed.   30 tablet   0   . ondansetron (ZOFRAN) 4 MG tablet      Take 1-2 tabs by mouth every 8 hours as needed for nausea/vomiting   30 tablet   0   . ibuprofen (ADVIL,MOTRIN) 800 MG tablet   Oral   Take 1 tablet (800 mg total) by mouth every 8 (eight) hours as needed.   30 tablet   0   . traMADol (ULTRAM) 50 MG tablet   Oral   Take 1 tablet (50 mg total) by mouth every 6 (six) hours as needed.   20 tablet   0     Allergies Review of patient's allergies indicates no known allergies.  No family history on file.  Social History Social History  Substance Use Topics  . Smoking status: Current  Every Day Smoker  . Smokeless tobacco: None  . Alcohol Use: Yes     Comment: occassionaly    Review of Systems Constitutional: No fever/chills Eyes: No visual changes. ENT: No sore throat. Cardiovascular: Denies chest pain. Respiratory: Denies shortness of breath. Gastrointestinal: No abdominal pain.  No nausea, no vomiting.  No diarrhea.  No constipation. Genitourinary: Negative for dysuria. Musculoskeletal: Negative for back pain. Positive for pain in her right posterior calf that increases with walking and movement times one month. Skin: Negative for rash. Neurological: Negative for headaches, focal weakness or numbness.  10-point ROS otherwise negative.  ____________________________________________   PHYSICAL EXAM:  VITAL SIGNS: ED Triage Vitals  Enc Vitals Group     BP 06/03/15 0700 102/74 mmHg     Pulse Rate 06/03/15 0700 65     Resp --      Temp --      Temp src --      SpO2 06/03/15 0700 99 %     Weight --      Height --      Head Cir --      Peak Flow --      Pain Score 06/03/15 0154 8  Pain Loc --      Pain Edu? --      Excl. in GC? --     Constitutional: Alert and oriented. Well appearing and in Mild acute distress from the pain. Eyes: Conjunctivae are normal. PERRL. EOMI. Head: Atraumatic. Nose: No congestion/rhinnorhea. Mouth/Throat: Mucous membranes are moist.  Oropharynx non-erythematous. Neck: No stridor.  Cardiovascular: Normal rate, regular rhythm. Grossly normal heart sounds.  Good peripheral circulation. Respiratory: Normal respiratory effort.  No retractions. Lungs CTAB. Gastrointestinal: Soft and nontender. No distention. No abdominal bruits. No CVA tenderness. Musculoskeletal: Positive for lower extremity tenderness in the right posterior calf with limited range of motion secondary to pain. No definite Homans sign. no edema.  No joint effusions. Neurologic:  Normal speech and language. No gross focal neurologic deficits are appreciated.  No gait instability. Skin:  Skin is warm, dry and intact. No rash noted. Psychiatric: Mood and affect are normal. Speech and behavior are normal.  ____________________________________________   LABS (all labs ordered are listed, but only abnormal results are displayed)  Labs Reviewed  CBC WITH DIFFERENTIAL/PLATELET - Abnormal; Notable for the following:    RBC 3.56 (*)    HCT 34.3 (*)    All other components within normal limits  COMPREHENSIVE METABOLIC PANEL - Abnormal; Notable for the following:    Potassium 3.3 (*)    Calcium 8.7 (*)    ALT 8 (*)    Anion gap 4 (*)    All other components within normal limits  CK   ____________________________________________  EKG none ____________________________________________  RADIOLOGY [place] Venous Img Lower Bilateral  06/03/2015  CLINICAL DATA:  Bilateral lower extremity pain and swelling EXAM: BILATERAL LOWER EXTREMITY VENOUS DOPPLER ULTRASOUND TECHNIQUE: Gray-scale sonography with graded compression, as well as color Doppler and duplex ultrasound were performed to evaluate the lower extremity deep venous systems from the level of the common femoral vein and including the common femoral, femoral, profunda femoral, popliteal and calf veins including the posterior tibial, peroneal and gastrocnemius veins when visible. The superficial great saphenous vein was also interrogated. Spectral Doppler was utilized to evaluate flow at rest and with distal augmentation maneuvers in the common femoral, femoral and popliteal veins. COMPARISON:  None. FINDINGS: RIGHT LOWER EXTREMITY Common Femoral Vein: No evidence of thrombus. Normal compressibility, respiratory phasicity and response to augmentation. Saphenofemoral Junction: No evidence of thrombus. Normal compressibility and flow on color Doppler imaging. Profunda Femoral Vein: No evidence of thrombus. Normal compressibility and flow on color Doppler imaging. Femoral Vein: No evidence of thrombus. Normal  compressibility, respiratory phasicity and response to augmentation. Popliteal Vein: No evidence of thrombus. Normal compressibility, respiratory phasicity and response to augmentation. Calf Veins: Not well seen. Superficial Great Saphenous Vein: No evidence of thrombus. Normal compressibility and flow on color Doppler imaging. Venous Reflux:  None. Other Findings:  None. LEFT LOWER EXTREMITY Common Femoral Vein: No evidence of thrombus. Normal compressibility, respiratory phasicity and response to augmentation. Saphenofemoral Junction: No evidence of thrombus. Normal compressibility and flow on color Doppler imaging. Profunda Femoral Vein: No evidence of thrombus. Normal compressibility and flow on color Doppler imaging. Femoral Vein: No evidence of thrombus. Normal compressibility, respiratory phasicity and response to augmentation. Popliteal Vein: No evidence of thrombus. Normal compressibility, respiratory phasicity and response to augmentation. Calf Veins: Not well seen. Superficial Great Saphenous Vein: No evidence of thrombus. Normal compressibility and flow on color Doppler imaging. Venous Reflux:  None. Other Findings:  None. IMPRESSION: No evidence of deep venous thrombosis. Electronically Signed  By: Ellery Plunk M.D.   On: 06/03/2015 03:14    ____________________________________________   PROCEDURES  Procedure(s) performed: none  Critical Care performed: No  ____________________________________________   INITIAL IMPRESSION / ASSESSMENT AND PLAN / ED COURSE  Pertinent labs & imaging results that were available during my care of the patient were reviewed by me and considered in my medical decision making (see chart for details).  9:43 AM Patient had bilateral lower extremity Dopplers which were both negative. Patient will get routine labs including electrolytes and CK. Patient will be placed on tramadol and ibuprofen and was encouraged to follow up with her primary care M.D. for  further workup and evaluation. Patient was told to return immediately if condition worsens.  9:43 AM Patient's labs were normal except for potassium was 3.4. Patient was given 40 mEq of potassium, and she will be discharged home with tramadol and ibuprofen to take for pain and to follow-up with her primary care M.D. for any further workup is needed. __________________________________________ __   FINAL CLINICAL IMPRESSION(S) / ED DIAGNOSES  Final diagnoses:  Bilateral leg pain  Myalgia  Hypokalemia      Leona Carry, MD 06/03/15 [phone]

## 2015-06-03 NOTE — ED Notes (Addendum)
Pt to triage via w/c with no distress noted; pt reports swelling/pain to lower legs bilat; st pain especially behind both knees; denies any accomp symptoms

## 2015-07-18 ENCOUNTER — Encounter: Payer: Self-pay | Admitting: Emergency Medicine

## 2015-07-18 ENCOUNTER — Emergency Department: Payer: Self-pay

## 2015-07-18 ENCOUNTER — Emergency Department
Admission: EM | Admit: 2015-07-18 | Discharge: 2015-07-18 | Disposition: A | Discharge status: Home / Self Care | Payer: Self-pay | Attending: Emergency Medicine | Admitting: Emergency Medicine

## 2015-07-18 DIAGNOSIS — I10 Essential (primary) hypertension: Secondary | ICD-10-CM | POA: Insufficient documentation

## 2015-07-18 DIAGNOSIS — R197 Diarrhea, unspecified: Secondary | ICD-10-CM | POA: Insufficient documentation

## 2015-07-18 DIAGNOSIS — R319 Hematuria, unspecified: Secondary | ICD-10-CM | POA: Insufficient documentation

## 2015-07-18 DIAGNOSIS — Z79899 Other long term (current) drug therapy: Secondary | ICD-10-CM | POA: Insufficient documentation

## 2015-07-18 DIAGNOSIS — J45909 Unspecified asthma, uncomplicated: Secondary | ICD-10-CM | POA: Insufficient documentation

## 2015-07-18 DIAGNOSIS — R109 Unspecified abdominal pain: Secondary | ICD-10-CM

## 2015-07-18 DIAGNOSIS — F1299 Cannabis use, unspecified with unspecified cannabis-induced disorder: Secondary | ICD-10-CM | POA: Insufficient documentation

## 2015-07-18 DIAGNOSIS — F17200 Nicotine dependence, unspecified, uncomplicated: Secondary | ICD-10-CM | POA: Insufficient documentation

## 2015-07-18 HISTORY — DX: Bronchitis, not specified as acute or chronic: J40

## 2015-07-18 LAB — URINALYSIS COMPLETE WITH MICROSCOPIC (ARMC ONLY)
BILIRUBIN URINE: NEGATIVE
Bacteria, UA: NONE SEEN
GLUCOSE, UA: NEGATIVE mg/dL
KETONES UR: NEGATIVE mg/dL
Leukocytes, UA: NEGATIVE
Nitrite: NEGATIVE
PH: 6 (ref 5.0–8.0)
Protein, ur: NEGATIVE mg/dL
Specific Gravity, Urine: 1.02 (ref 1.005–1.030)

## 2015-07-18 LAB — COMPREHENSIVE METABOLIC PANEL
ALBUMIN: 4.6 g/dL (ref 3.5–5.0)
ALK PHOS: 92 U/L (ref 38–126)
ALT: 9 U/L — ABNORMAL LOW (ref 14–54)
AST: 26 U/L (ref 15–41)
Anion gap: 7 (ref 5–15)
BUN: 16 mg/dL (ref 6–20)
CALCIUM: 9.2 mg/dL (ref 8.9–10.3)
CO2: 27 mmol/L (ref 22–32)
CREATININE: 0.9 mg/dL (ref 0.44–1.00)
Chloride: 104 mmol/L (ref 101–111)
GFR calc non Af Amer: >60 mL/min (ref >60)
Glucose, Bld: 118 mg/dL — ABNORMAL HIGH (ref 65–99)
Potassium: 2.7 mmol/L — CL (ref 3.5–5.1)
SODIUM: 138 mmol/L (ref 135–145)
Total Bilirubin: 0.6 mg/dL (ref 0.3–1.2)
Total Protein: 7.8 g/dL (ref 6.5–8.1)

## 2015-07-18 LAB — CBC
HCT: 37.3 % (ref 35.0–47.0)
Hemoglobin: 13.2 g/dL (ref 12.0–16.0)
MCH: 34 pg (ref 26.0–34.0)
MCHC: 35.4 g/dL (ref 32.0–36.0)
MCV: 96 fL (ref 80.0–100.0)
PLATELETS: 237 10*3/uL (ref 150–440)
RBC: 3.88 MIL/uL (ref 3.80–5.20)
RDW: 12.6 % (ref 11.5–14.5)
WBC: 13.5 10*3/uL — ABNORMAL HIGH (ref 3.6–11.0)

## 2015-07-18 LAB — POCT PREGNANCY, URINE: Preg Test, Ur: NEGATIVE

## 2015-07-18 LAB — LIPASE, BLOOD: LIPASE: 26 U/L (ref 11–51)

## 2015-07-18 MED ORDER — IOPAMIDOL (ISOVUE-300) INJECTION 61%
100.0000 mL | Freq: Once | INTRAVENOUS | Status: AC | PRN
  Administered 2015-07-18: 100 mL via INTRAVENOUS

## 2015-07-18 MED ORDER — DIATRIZOATE MEGLUMINE & SODIUM 66-10 % PO SOLN
15.0000 mL | Freq: Once | ORAL | Status: AC
  Administered 2015-07-18: 15 mL via ORAL

## 2015-07-18 MED ORDER — ONDANSETRON 4 MG PO TBDP: 4.0000 mg | ORAL_TABLET | Freq: Three times a day (TID) | ORAL | Status: DC | PRN

## 2015-07-18 MED ORDER — SODIUM CHLORIDE 0.9 % IV BOLUS (SEPSIS)
1000.0000 mL | Freq: Once | INTRAVENOUS | Status: AC
  Administered 2015-07-18: 1000 mL via INTRAVENOUS

## 2015-07-18 MED ORDER — POTASSIUM CHLORIDE IN NACL 20-0.9 MEQ/L-% IV SOLN
Freq: Once | INTRAVENOUS | Status: AC
  Administered 2015-07-18: 08:00:00 via INTRAVENOUS
  Filled 2015-07-18: qty 1000

## 2015-07-18 MED ORDER — ONDANSETRON 4 MG PO TBDP
4.0000 mg | ORAL_TABLET | Freq: Once | ORAL | Status: AC | PRN
  Administered 2015-07-18: 4 mg via ORAL
  Filled 2015-07-18: qty 1

## 2015-07-18 MED ORDER — POTASSIUM CHLORIDE 20 MEQ PO PACK
40.0000 meq | PACK | Freq: Once | ORAL | Status: AC
  Administered 2015-07-18: 40 meq via ORAL
  Filled 2015-07-18: qty 2

## 2015-07-18 NOTE — ED Notes (Signed)
Patient with complaint of generalized abd pain and diarrhea that started on Wednesday. Patient reports that she started vomiting tonight. Patient reports that she has had 2 episodes of vomiting.

## 2015-07-18 NOTE — ED Notes (Signed)
EDP notified of critical potassium level.  

## 2015-07-18 NOTE — ED Notes (Signed)
Charge nurse notified of potassium 2.7

## 2015-07-18 NOTE — ED Provider Notes (Signed)
[facility] Emergency Department Provider Note   ____________________________________________  Time seen: Approximately 7:28 AM  I have reviewed the triage vital signs and the nursing notes.   HISTORY  Chief Complaint Emesis; Diarrhea; and Abdominal Pain    HPI Patricia [name] is a 50 y.o. female patient reports diarrhea since Wednesday and then nausea vomiting and upper abdominal pain which was severe and crampy in nature starting yesterday. She was at work today and couldn't do any work In the bathroom to have significant emergency room. Patient had Zofran while awaiting and now resting sitting in the stretcher feels better. Patient that she might have eaten something bad.  Past Medical History  Diagnosis Date  . Asthma   . Hypertension   . Bronchitis     There are no active problems to display for this patient.   Past Surgical History  Procedure Laterality Date  . Cholecystectomy    . Tubal ligation      Current Outpatient Rx  Name  Route  Sig  Dispense  Refill  . hydrochlorothiazide (HYDRODIURIL) 12.5 MG tablet   Oral   Take 12.5 mg by mouth daily.          Marland Kitchen. ibuprofen (ADVIL,MOTRIN) 800 MG tablet   Oral   Take 1 tablet (800 mg total) by mouth every 8 (eight) hours as needed.   30 tablet   0   . ibuprofen (ADVIL,MOTRIN) 800 MG tablet   Oral   Take 1 tablet (800 mg total) by mouth every 8 (eight) hours as needed.   30 tablet   0   . ondansetron (ZOFRAN ODT) 4 MG disintegrating tablet   Oral   Take 1 tablet (4 mg total) by mouth every 8 (eight) hours as needed for nausea or vomiting.   5 tablet   0   . ondansetron (ZOFRAN) 4 MG tablet      Take 1-2 tabs by mouth every 8 hours as needed for nausea/vomiting   30 tablet   0   . traMADol (ULTRAM) 50 MG tablet   Oral   Take 1 tablet (50 mg total) by mouth every 6 (six) hours as needed.   20 tablet   0     Allergies Review of patient's allergies indicates no known  allergies.  No family history on file.  Social History Social History  Substance Use Topics  . Smoking status: Current Every Day Smoker  . Smokeless tobacco: None  . Alcohol Use: Yes     Comment: occassionaly    Review of Systems Constitutional: No fever/chills Eyes: No visual changes. ENT: No sore throat. Cardiovascular: Denies chest pain. Respiratory: Denies shortness of breath. Gastrointestinal: See history of present illness Genitourinary: Negative for dysuria. Musculoskeletal: Patient reports low back started aching today. Skin: Negative for rash. Neurological: Negative for headaches, focal weakness or numbness.  10-point ROS otherwise negative.  ____________________________________________   PHYSICAL EXAM:  VITAL SIGNS: ED Triage Vitals  Enc Vitals Group     BP 07/18/15 0343 125/86 mmHg     Pulse Rate 07/18/15 0343 87     Resp 07/18/15 0343 18     Temp 07/18/15 0343 97.7 F (36.5 C)     Temp src --      SpO2 07/18/15 0343 97 %     Weight 07/18/15 0343 165 lb (74.844 kg)     Height 07/18/15 0343 5\' 5"  (1.651 m)     Head Cir --  Peak Flow --      Pain Score 07/18/15 0344 5     Pain Loc --      Pain Edu? --      Excl. in GC? --     Constitutional: Alert and oriented. Well appearing and in no acute distress. Eyes: Conjunctivae are normal. PERRL. EOMI. Head: Atraumatic. Nose: No congestion/rhinnorhea. Mouth/Throat: Mucous membranes are moist.  Oropharynx non-erythematous. Neck: No stridor. Cardiovascular: Normal rate, regular rhythm. Grossly normal heart sounds.  Good peripheral circulation. Respiratory: Normal respiratory effort.  No retractions. Lungs CTAB. Gastrointestinal: Soft and nontenderExcept very mildly in the epigastric area.. No distention. No abdominal bruits. No CVA tenderness. Musculoskeletal: No lower extremity tenderness nor edema.  No joint effusions. Neurologic:  Normal speech and language. No gross focal neurologic deficits are  appreciated. No gait instability. Skin:  Skin is warm, dry and intact. No rash noted. Psychiatric: Mood and affect are normal. Speech and behavior are normal.  ____________________________________________   LABS (all labs ordered are listed, but only abnormal results are displayed)  Labs Reviewed  COMPREHENSIVE METABOLIC PANEL - Abnormal; Notable for the following:    Potassium 2.7 (*)    Glucose, Bld 118 (*)    ALT 9 (*)    All other components within normal limits  CBC - Abnormal; Notable for the following:    WBC 13.5 (*)    All other components within normal limits  URINALYSIS COMPLETEWITH MICROSCOPIC ([facility] ONLY) - Abnormal; Notable for the following:    Color, Urine YELLOW (*)    APPearance CLEAR (*)    Hgb urine dipstick 1+ (*)    Squamous Epithelial / LPF 0-5 (*)    All other components within normal limits  LIPASE, BLOOD  POCT PREGNANCY, URINE   ____________________________________________  EKG   ____________________________________________  RADIOLOGY   ____________________________________________   PROCEDURES   ____________________________________________   INITIAL IMPRESSION / ASSESSMENT AND PLAN / ED COURSE  Pertinent labs & imaging results that were available during my care of the patient were reviewed by me and considered in my medical decision making (see chart for details).   ____________________________________________   FINAL CLINICAL IMPRESSION(S) / ED DIAGNOSES  Final diagnoses:  Hematuria  Abdominal pain, unspecified abdominal location  Diarrhea, unspecified type      NEW MEDICATIONS STARTED DURING THIS VISIT:  Discharge Medication List as of 07/18/2015 10:29 AM    START taking these medications   Details  ondansetron (ZOFRAN ODT) 4 MG disintegrating tablet Take 1 tablet (4 mg total) by mouth every 8 (eight) hours as needed for nausea or vomiting., Starting 07/18/2015, Until Discontinued, Print         Note:  This  document was prepared using Dragon voice recognition software and may include unintentional dictation errors.    Arnaldo Natal, MD 07/18/15 [phone]

## 2015-10-27 ENCOUNTER — Emergency Department
Admission: EM | Admit: 2015-10-27 | Discharge: 2015-10-27 | Disposition: A | Discharge status: Home / Self Care | Payer: Self-pay | Attending: Emergency Medicine | Admitting: Emergency Medicine

## 2015-10-27 ENCOUNTER — Encounter: Payer: Self-pay | Admitting: Emergency Medicine

## 2015-10-27 ENCOUNTER — Emergency Department: Payer: Self-pay

## 2015-10-27 DIAGNOSIS — I10 Essential (primary) hypertension: Secondary | ICD-10-CM | POA: Insufficient documentation

## 2015-10-27 DIAGNOSIS — F17200 Nicotine dependence, unspecified, uncomplicated: Secondary | ICD-10-CM | POA: Insufficient documentation

## 2015-10-27 DIAGNOSIS — G43809 Other migraine, not intractable, without status migrainosus: Secondary | ICD-10-CM | POA: Insufficient documentation

## 2015-10-27 DIAGNOSIS — E876 Hypokalemia: Secondary | ICD-10-CM | POA: Insufficient documentation

## 2015-10-27 DIAGNOSIS — J45909 Unspecified asthma, uncomplicated: Secondary | ICD-10-CM | POA: Insufficient documentation

## 2015-10-27 LAB — BASIC METABOLIC PANEL
ANION GAP: 7 (ref 5–15)
BUN: 6 mg/dL (ref 6–20)
CALCIUM: 8.8 mg/dL — AB (ref 8.9–10.3)
CO2: 26 mmol/L (ref 22–32)
Chloride: 106 mmol/L (ref 101–111)
Creatinine, Ser: 0.76 mg/dL (ref 0.44–1.00)
GLUCOSE: 105 mg/dL — AB (ref 65–99)
POTASSIUM: 3 mmol/L — AB (ref 3.5–5.1)
SODIUM: 139 mmol/L (ref 135–145)

## 2015-10-27 LAB — CBC WITH DIFFERENTIAL/PLATELET
BASOS ABS: 0 10*3/uL (ref 0–0.1)
BASOS PCT: 1 %
EOS ABS: 0 10*3/uL (ref 0–0.7)
EOS PCT: 0 %
HCT: 36.3 % (ref 35.0–47.0)
Hemoglobin: 13.1 g/dL (ref 12.0–16.0)
Lymphocytes Relative: 23 %
Lymphs Abs: 1 10*3/uL (ref 1.0–3.6)
MCH: 34.9 pg — ABNORMAL HIGH (ref 26.0–34.0)
MCHC: 36 g/dL (ref 32.0–36.0)
MCV: 96.9 fL (ref 80.0–100.0)
MONO ABS: 0.5 10*3/uL (ref 0.2–0.9)
Monocytes Relative: 12 %
Neutro Abs: 2.8 10*3/uL (ref 1.4–6.5)
Neutrophils Relative %: 64 %
PLATELETS: 151 10*3/uL (ref 150–440)
RBC: 3.75 MIL/uL — AB (ref 3.80–5.20)
RDW: 12.1 % (ref 11.5–14.5)
WBC: 4.4 10*3/uL (ref 3.6–11.0)

## 2015-10-27 LAB — TROPONIN I

## 2015-10-27 MED ORDER — SODIUM CHLORIDE 0.9 % IV BOLUS (SEPSIS)
1000.0000 mL | Freq: Once | INTRAVENOUS | Status: AC
  Administered 2015-10-27: 1000 mL via INTRAVENOUS

## 2015-10-27 MED ORDER — DIPHENHYDRAMINE HCL 50 MG/ML IJ SOLN
12.5000 mg | Freq: Once | INTRAMUSCULAR | Status: AC
  Administered 2015-10-27: 12.5 mg via INTRAVENOUS
  Filled 2015-10-27: qty 1

## 2015-10-27 MED ORDER — POTASSIUM CHLORIDE CRYS ER 20 MEQ PO TBCR
40.0000 meq | EXTENDED_RELEASE_TABLET | Freq: Once | ORAL | Status: AC
  Administered 2015-10-27: 40 meq via ORAL
  Filled 2015-10-27: qty 2

## 2015-10-27 MED ORDER — PROCHLORPERAZINE MALEATE 10 MG PO TABS: 10.0000 mg | ORAL_TABLET | Freq: Four times a day (QID) | ORAL | 0 refills | Status: DC | PRN

## 2015-10-27 MED ORDER — PROCHLORPERAZINE EDISYLATE 5 MG/ML IJ SOLN
5.0000 mg | Freq: Once | INTRAMUSCULAR | Status: AC
  Administered 2015-10-27: 5 mg via INTRAVENOUS
  Filled 2015-10-27: qty 2

## 2015-10-27 MED ORDER — KETOROLAC TROMETHAMINE 30 MG/ML IJ SOLN
30.0000 mg | Freq: Once | INTRAMUSCULAR | Status: AC
  Administered 2015-10-27: 30 mg via INTRAVENOUS
  Filled 2015-10-27: qty 1

## 2015-10-27 NOTE — ED Triage Notes (Signed)
Pt to triage via w/c with no distress noted; Pt reports frontal HA accomp by nausea x few days; st hx HTN but not taking any meds

## 2015-10-27 NOTE — Discharge Instructions (Signed)
1. You may take Compazine (#20) as needed for headaches. 2. Return to the ER for worsening symptoms, persistent vomiting, difficulty breathing or other concerns.

## 2015-10-27 NOTE — ED Provider Notes (Signed)
[facility] Emergency Department Provider Note   ____________________________________________   First MD Initiated Contact with Patient 10/27/15 [phone]     (approximate)  I have reviewed the triage vital signs and the nursing notes.   HISTORY  Chief Complaint Headache   HPI Patricia [name] is a 50 y.o. female who presents to the ED from home with a chief complaint of headache. Reports frequent headaches but no formal diagnosis of migraines. Has been having cough and congestion consistent with bronchitis this week. Reports a 2-3 day history of frontal and right-sided headache which was gradual onset, now accompanied with nausea and vomiting. Denies associated fever, chills, neck pain, vision changes, chest pain, shortness of breath, abdominal pain, diarrhea. Denies recent travel or trauma. Nothing makes her symptoms better or worse.   Past Medical History:  Diagnosis Date  . Asthma   . Bronchitis   . Hypertension     There are no active problems to display for this patient.   Past Surgical History:  Procedure Laterality Date  . CHOLECYSTECTOMY    . TUBAL LIGATION      Prior to Admission medications   Not on File    Allergies Review of patient's allergies indicates no known allergies.  Family history Sister with migraines None for cerebral aneurysm  Social History Social History  Substance Use Topics  . Smoking status: Current Every Day Smoker  . Smokeless tobacco: Never Used  . Alcohol use Yes     Comment: occassionaly    Review of Systems  Constitutional: No fever/chills. Eyes: No visual changes. ENT: No sore throat. Cardiovascular: Denies chest pain. Respiratory: Denies shortness of breath. Gastrointestinal: No abdominal pain.  Positive for nausea and vomiting.  No diarrhea.  No constipation. Genitourinary: Negative for dysuria. Musculoskeletal: Negative for back pain. Skin: Negative for rash. Neurological: Positive for  headache. Negative for focal weakness or numbness.  10-point ROS otherwise negative.  ____________________________________________   PHYSICAL EXAM:  VITAL SIGNS: ED Triage Vitals  Enc Vitals Group     BP 10/27/15 0036 131/87     Pulse Rate 10/27/15 0036 95     Resp 10/27/15 0036 18     Temp 10/27/15 0036 98.4 F (36.9 C)     Temp Source 10/27/15 0036 Oral     SpO2 10/27/15 0036 99 %     Weight 10/27/15 0034 167 lb (75.8 kg)     Height 10/27/15 0034 5\' 5"  (1.651 m)     Head Circumference --      Peak Flow --      Pain Score 10/27/15 0035 10     Pain Loc --      Pain Edu? --      Excl. in GC? --     Constitutional: Alert and oriented. Well appearing and in no acute distress. Eyes: Conjunctivae are normal. PERRL. EOMI. Head: Atraumatic. Nose: No congestion/rhinnorhea. Mouth/Throat: Mucous membranes are moist.  Oropharynx non-erythematous. Neck: No stridor.  Supple neck without meningismus. Cardiovascular: Normal rate, regular rhythm. Grossly normal heart sounds.  Good peripheral circulation. Respiratory: Normal respiratory effort.  No retractions. Lungs CTAB. Gastrointestinal: Soft and nontender. No distention. No abdominal bruits. No CVA tenderness. Musculoskeletal: No lower extremity tenderness nor edema.  No joint effusions. Neurologic:  Normal speech and language. No gross focal neurologic deficits are appreciated. No gait instability. Skin:  Skin is warm, dry and intact. No rash noted. Psychiatric: Mood and affect are normal. Speech and behavior are normal.  ____________________________________________  LABS (all labs ordered are listed, but only abnormal results are displayed)  Labs Reviewed  CBC WITH DIFFERENTIAL/PLATELET - Abnormal; Notable for the following:       Result Value   RBC 3.75 (*)    MCH 34.9 (*)    All other components within normal limits  BASIC METABOLIC PANEL - Abnormal; Notable for the following:    Potassium 3.0 (*)    Glucose, Bld 105  (*)    Calcium 8.8 (*)    All other components within normal limits  TROPONIN I   ____________________________________________  EKG  ED ECG REPORT I, Kellyann Ordway J, the attending physician, personally viewed and interpreted this ECG.   Date: 10/27/2015  EKG Time: 0440  Rate: 77  Rhythm: normal EKG, normal sinus rhythm  Axis: Normal  Intervals:none  ST&T Change: Nonspecific  ____________________________________________  RADIOLOGY  Portable chest x-ray (viewed by me, interpreted per Dr. Clovis [name]): No active disease. ____________________________________________   PROCEDURES  Procedure(s) performed: None  Procedures  Critical Care performed: No  ____________________________________________   INITIAL IMPRESSION / ASSESSMENT AND PLAN / ED COURSE  Pertinent labs & imaging results that were available during my care of the patient were reviewed by me and considered in my medical decision making (see chart for details).  50 year old female who presents with recurrent migraine headache with nausea and vomiting. No focal neurological deficits on exam. EKG was performed for patient complaining of history of electrolyte imbalance. Will obtain screening lab work, infuse headache cocktail and reassess.  Clinical Course  Comment By Time  Headache much improved. Patient is smiling. Will prescribe Compazine for patient to take at home. Encouraged her to discuss with her PCP neurology referral. Strict return precautions given. Patient and son verbalize understanding and agree with plan of care. Irean Hong, MD 08/29 [phone]     ____________________________________________   FINAL CLINICAL IMPRESSION(S) / ED DIAGNOSES  Final diagnoses:  Other migraine without status migrainosus, not intractable  Hypokalemia      NEW MEDICATIONS STARTED DURING THIS VISIT:  New Prescriptions   No medications on file     Note:  This document was prepared using Dragon voice recognition  software and may include unintentional dictation errors.    Irean Hong, MD 10/27/15 [phone]

## 2016-04-08 ENCOUNTER — Emergency Department: Payer: Self-pay

## 2016-04-08 ENCOUNTER — Encounter: Payer: Self-pay | Admitting: Emergency Medicine

## 2016-04-08 ENCOUNTER — Emergency Department
Admission: EM | Admit: 2016-04-08 | Discharge: 2016-04-08 | Disposition: A | Discharge status: Home / Self Care | Payer: Self-pay | Attending: Emergency Medicine | Admitting: Emergency Medicine

## 2016-04-08 DIAGNOSIS — J111 Influenza due to unidentified influenza virus with other respiratory manifestations: Secondary | ICD-10-CM | POA: Insufficient documentation

## 2016-04-08 DIAGNOSIS — J45909 Unspecified asthma, uncomplicated: Secondary | ICD-10-CM | POA: Insufficient documentation

## 2016-04-08 DIAGNOSIS — I10 Essential (primary) hypertension: Secondary | ICD-10-CM | POA: Insufficient documentation

## 2016-04-08 DIAGNOSIS — F17210 Nicotine dependence, cigarettes, uncomplicated: Secondary | ICD-10-CM | POA: Insufficient documentation

## 2016-04-08 MED ORDER — BENZONATATE 100 MG PO CAPS
ORAL_CAPSULE | ORAL | Status: AC
  Administered 2016-04-08: 200 mg via ORAL
  Filled 2016-04-08: qty 2

## 2016-04-08 MED ORDER — IBUPROFEN 600 MG PO TABS
600.0000 mg | ORAL_TABLET | Freq: Once | ORAL | Status: AC
  Administered 2016-04-08: 600 mg via ORAL

## 2016-04-08 MED ORDER — IBUPROFEN 600 MG PO TABS
ORAL_TABLET | ORAL | Status: AC
  Administered 2016-04-08: 600 mg via ORAL
  Filled 2016-04-08: qty 1

## 2016-04-08 MED ORDER — BENZONATATE 100 MG PO CAPS
200.0000 mg | ORAL_CAPSULE | Freq: Once | ORAL | Status: AC
  Administered 2016-04-08: 200 mg via ORAL

## 2016-04-08 NOTE — ED Provider Notes (Signed)
[facility] Emergency Department Provider Note    First MD Initiated Contact with Patient 04/08/16 0533     (approximate)  I have reviewed the triage vital signs and the nursing notes.   HISTORY  Chief Complaint Generalized Body Aches; Headache; and Nasal Congestion    HPI Patricia [name] is a 51 y.o. female with below list of medical conditions presents with generalized bodyaches, nasal congestion, headaches. Patient states that her son was recently diagnosed with influenza on Monday.   Past Medical History:  Diagnosis Date  . Asthma   . Bronchitis   . Hypertension     There are no active problems to display for this patient.   Past Surgical History:  Procedure Laterality Date  . CHOLECYSTECTOMY    . TUBAL LIGATION      Prior to Admission medications   Medication Sig Start Date End Date Taking? Authorizing Provider  prochlorperazine (COMPAZINE) 10 MG tablet Take 1 tablet (10 mg total) by mouth every 6 (six) hours as needed for nausea. 10/27/15   Irean Hong, MD    Allergies Patient has no known allergies.  No family history on file.  Social History Social History  Substance Use Topics  . Smoking status: Current Every Day Smoker    Types: Cigarettes  . Smokeless tobacco: Never Used  . Alcohol use Yes     Comment: occassionaly    Review of Systems Constitutional: No fever/chills Eyes: No visual changes. ENT: No sore throat. Cardiovascular: Denies chest pain. Respiratory: Denies shortness of breath. Gastrointestinal: No abdominal pain.  No nausea, no vomiting.  No diarrhea.  No constipation. Genitourinary: Negative for dysuria. Musculoskeletal: Negative for back pain. Skin: Negative for rash. Neurological: Negative for headaches, focal weakness or numbness.  10-point ROS otherwise negative.  ____________________________________________   PHYSICAL EXAM:  VITAL SIGNS: ED Triage Vitals  Enc Vitals Group     BP 04/08/16  0534 129/86     Pulse Rate 04/08/16 0534 97     Resp 04/08/16 0534 (!) 23     Temp --      Temp src --      SpO2 04/08/16 0534 98 %     Weight 04/08/16 0125 160 lb (72.6 kg)     Height 04/08/16 0125 5\' 5"  (1.651 m)     Head Circumference --      Peak Flow --      Pain Score 04/08/16 0125 10     Pain Loc --      Pain Edu? --      Excl. in GC? --     Constitutional: Alert and oriented. Well appearing and in no acute distress. Eyes: Conjunctivae are normal. PERRL. EOMI. Head: Atraumatic. Mouth/Throat: Mucous membranes are moist Neck: No stridor.  No meningeal signs.  Cardiovascular: Normal rate, regular rhythm. Good peripheral circulation. Grossly normal heart sounds. Respiratory: Normal respiratory effort.  No retractions. Lungs CTAB. Gastrointestinal: Soft and nontender. No distention.  Musculoskeletal: No lower extremity tenderness nor edema. No gross deformities of extremities. Neurologic:  Normal speech and language. No gross focal neurologic deficits are appreciated.  Skin:  Skin is warm, dry and intact. No rash noted. Psychiatric: Mood and affect are normal. Speech and behavior are normal.     RADIOLOGY I, [place] N Joselynn Amoroso, personally viewed and evaluated these images (plain radiographs) as part of my medical decision making, as well as reviewing the written report by the radiologist.  Dg Chest 2 View  Result Date:  04/08/2016 CLINICAL DATA:  Body aches, congestion, and cough since yesterday. Family member with flu. EXAM: CHEST  2 VIEW COMPARISON:  10/27/2015 FINDINGS: The heart size and mediastinal contours are within normal limits. Both lungs are clear. The visualized skeletal structures are unremarkable. IMPRESSION: No active cardiopulmonary disease. Electronically Signed   By: Burman NievesWilliam  Stevens M.D.   On: 04/08/2016 06:07     Procedures      INITIAL IMPRESSION / ASSESSMENT AND PLAN / ED COURSE  Pertinent labs & imaging results that were available during my care  of the patient were reviewed by me and considered in my medical decision making (see chart for details).  History physical exam consistent with influenza. Discussed Tamiflu to patient which she refused. I informed the patient warning signs that would warn immediate return to the emergency department.      ____________________________________________  FINAL CLINICAL IMPRESSION(S) / ED DIAGNOSES  Final diagnoses:  Influenza     MEDICATIONS GIVEN DURING THIS VISIT:  Medications - No data to display   NEW OUTPATIENT MEDICATIONS STARTED DURING THIS VISIT:  New Prescriptions   No medications on file    Modified Medications   No medications on file    Discontinued Medications   No medications on file     Note:  This document was prepared using Dragon voice recognition software and may include unintentional dictation errors.    Darci Currentandolph N Swade Shonka, MD 04/08/16 [phone]

## 2016-04-08 NOTE — ED Triage Notes (Signed)
Pt presents to ED with c/o body aches, congestion, and cough. Onset yesterday. Son dx with flu Monday.

## 2016-07-08 ENCOUNTER — Emergency Department: Payer: No Typology Code available for payment source

## 2016-07-08 ENCOUNTER — Emergency Department
Admission: EM | Admit: 2016-07-08 | Discharge: 2016-07-08 | Disposition: A | Discharge status: Home / Self Care | Payer: No Typology Code available for payment source | Attending: Emergency Medicine | Admitting: Emergency Medicine

## 2016-07-08 ENCOUNTER — Encounter: Payer: Self-pay | Admitting: *Deleted

## 2016-07-08 DIAGNOSIS — J209 Acute bronchitis, unspecified: Secondary | ICD-10-CM | POA: Diagnosis not present

## 2016-07-08 DIAGNOSIS — I10 Essential (primary) hypertension: Secondary | ICD-10-CM | POA: Insufficient documentation

## 2016-07-08 DIAGNOSIS — Z79899 Other long term (current) drug therapy: Secondary | ICD-10-CM | POA: Diagnosis not present

## 2016-07-08 DIAGNOSIS — R05 Cough: Secondary | ICD-10-CM | POA: Diagnosis present

## 2016-07-08 DIAGNOSIS — J4521 Mild intermittent asthma with (acute) exacerbation: Secondary | ICD-10-CM | POA: Insufficient documentation

## 2016-07-08 DIAGNOSIS — F17210 Nicotine dependence, cigarettes, uncomplicated: Secondary | ICD-10-CM | POA: Diagnosis not present

## 2016-07-08 LAB — CBC
HEMATOCRIT: 36.2 % (ref 35.0–47.0)
HEMOGLOBIN: 13 g/dL (ref 12.0–16.0)
MCH: 34.8 pg — ABNORMAL HIGH (ref 26.0–34.0)
MCHC: 35.8 g/dL (ref 32.0–36.0)
MCV: 97.2 fL (ref 80.0–100.0)
Platelets: 203 10*3/uL (ref 150–440)
RBC: 3.73 MIL/uL — ABNORMAL LOW (ref 3.80–5.20)
RDW: 12.2 % (ref 11.5–14.5)
WBC: 5.7 10*3/uL (ref 3.6–11.0)

## 2016-07-08 LAB — BASIC METABOLIC PANEL
ANION GAP: 8 (ref 5–15)
BUN: 8 mg/dL (ref 6–20)
CHLORIDE: 108 mmol/L (ref 101–111)
CO2: 25 mmol/L (ref 22–32)
Calcium: 9.1 mg/dL (ref 8.9–10.3)
Creatinine, Ser: 0.87 mg/dL (ref 0.44–1.00)
GFR calc Af Amer: >60 mL/min (ref >60)
GLUCOSE: 108 mg/dL — AB (ref 65–99)
POTASSIUM: 3.4 mmol/L — AB (ref 3.5–5.1)
Sodium: 141 mmol/L (ref 135–145)

## 2016-07-08 LAB — TROPONIN I: Troponin I: <0.03 ng/mL (ref <0.03)

## 2016-07-08 MED ORDER — AZITHROMYCIN 500 MG PO TABS
500.0000 mg | ORAL_TABLET | Freq: Once | ORAL | Status: AC
  Administered 2016-07-08: 500 mg via ORAL
  Filled 2016-07-08: qty 1

## 2016-07-08 MED ORDER — AZITHROMYCIN 250 MG PO TABS: ORAL_TABLET | ORAL | 0 refills | Status: DC

## 2016-07-08 MED ORDER — PREDNISONE 20 MG PO TABS
40.0000 mg | ORAL_TABLET | Freq: Once | ORAL | Status: AC
  Administered 2016-07-08: 40 mg via ORAL
  Filled 2016-07-08: qty 2

## 2016-07-08 MED ORDER — IPRATROPIUM-ALBUTEROL 0.5-2.5 (3) MG/3ML IN SOLN
3.0000 mL | Freq: Once | RESPIRATORY_TRACT | Status: AC
  Administered 2016-07-08: 3 mL via RESPIRATORY_TRACT
  Filled 2016-07-08: qty 3

## 2016-07-08 MED ORDER — ALBUTEROL SULFATE HFA 108 (90 BASE) MCG/ACT IN AERS: 2.0000 | INHALATION_SPRAY | Freq: Four times a day (QID) | RESPIRATORY_TRACT | 2 refills | Status: AC | PRN

## 2016-07-08 MED ORDER — PREDNISONE 20 MG PO TABS: 40.0000 mg | ORAL_TABLET | Freq: Every day | ORAL | 0 refills | Status: DC

## 2016-07-08 NOTE — Discharge Instructions (Signed)
We believe that your symptoms are caused today by an exacerbation of your asthma and bronchitis.  Please take the prescribed medications and any medications that you have at home.  Follow up with your doctor as recommended.  If you develop any new or worsening symptoms, including but not limited to fever, persistent vomiting, worsening shortness of breath, or other symptoms that concern you, please return to the Emergency Department immediately.

## 2016-07-08 NOTE — ED Provider Notes (Signed)
[facility] Emergency Department Provider Note ____________________________________________   First MD Initiated Contact with Patient 07/08/16 1204     (approximate)  I have reviewed the triage vital signs and the nursing notes.   HISTORY  Chief Complaint Chest Pain  HPI Patricia [name] is a 51 y.o. female here for evaluation of cough and shortness of breath. Patient reports for about a month she's been experiencing "asthma" type symptoms off and on. She is been feeling tightness across her chest, a dry nonproductive cough and has occasionally used albuterol sometimes once a day for wheezing. She reports that she just can't shake this, and she is concerned that she is continuing to have some asthma attack.  She denies significant shoulder breathing, but reports that she notices when she is at work or at home that coughing and wheezing, bout making her feel congested and tight in the chest. Right now she denies having any chest pain but feels just a slight feeling of shortness of breath and tightness across her chest.  Patient reports she has experienced the same symptoms many times in the past. She is a current smoker. She denies any leg swelling. No recent trips or travel. Does not take any estrogen. She has never had any blood clots. No recent hospitalizations. Denies any bloody sputum.   Past Medical History:  Diagnosis Date  . Asthma   . Bronchitis   . Hypertension     There are no active problems to display for this patient.   Past Surgical History:  Procedure Laterality Date  . CHOLECYSTECTOMY    . TUBAL LIGATION      Prior to Admission medications   Medication Sig Start Date End Date Taking? Authorizing Provider  prochlorperazine (COMPAZINE) 10 MG tablet Take 1 tablet (10 mg total) by mouth every 6 (six) hours as needed for nausea. 10/27/15   Irean Hong, MD    Allergies Patient has no known allergies.  History reviewed. No pertinent  family history.  Social History Social History  Substance Use Topics  . Smoking status: Current Every Day Smoker    Types: Cigarettes  . Smokeless tobacco: Never Used  . Alcohol use Yes     Comment: occassionaly    Review of Systems Constitutional: No fever/chills Eyes: No visual changes. ENT: No sore throat. Cardiovascular: Denies chest pain. Feels "tight" especially when having coughing episodes  Respiratory: D See history of present illness Gastrointestinal: No abdominal pain.  No nausea, no vomiting.  No diarrhea.  No constipation. Genitourinary: Negative for dysuria. Musculoskeletal: Negative for back pain. Skin: Negative for rash. Neurological: Negative for headaches, focal weakness or numbness.  Denies pregnancy  10-point ROS otherwise negative.  ____________________________________________   PHYSICAL EXAM:  VITAL SIGNS: ED Triage Vitals  Enc Vitals Group     BP 07/08/16 1024 (!) 148/100     Pulse Rate 07/08/16 1024 73     Resp 07/08/16 1024 16     Temp 07/08/16 1024 98.1 F (36.7 C)     Temp Source 07/08/16 1024 Oral     SpO2 07/08/16 1024 99 %     Weight 07/08/16 1022 165 lb (74.8 kg)     Height 07/08/16 1022 5\' 5"  (1.651 m)     Head Circumference --      Peak Flow --      Pain Score 07/08/16 1022 10     Pain Loc --      Pain Edu? --  Excl. in GC? --     Constitutional: Alert and oriented. Well appearing and in no acute distress. Eyes: Conjunctivae are normal. PERRL. EOMI. Head: Atraumatic. Nose: No congestion/rhinnorhea. Mouth/Throat: Mucous membranes are moist.  Oropharynx non-erythematous. Neck: No stridor.   Cardiovascular: Normal rate, regular rhythm. Grossly normal heart sounds.  Good peripheral circulation. Respiratory: Normal respiratory effort.  No retractions. Lungs CTAB except for a slight increased and expiratory phase with minimal wheezing noted. Gastrointestinal: Soft and nontender. No distention. No abdominal bruits. No CVA  tenderness. Musculoskeletal: No lower extremity tenderness nor edema.  No joint effusions. Neurologic:  Normal speech and language. No gross focal neurologic deficits are appreciated.Skin:  Skin is warm, dry and intact. No rash noted. Psychiatric: Mood and affect are normal. Speech and behavior are normal.  ____________________________________________   LABS (all labs ordered are listed, but only abnormal results are displayed)  Labs Reviewed  BASIC METABOLIC PANEL - Abnormal; Notable for the following:       Result Value   Potassium 3.4 (*)    Glucose, Bld 108 (*)    All other components within normal limits  CBC - Abnormal; Notable for the following:    RBC 3.73 (*)    MCH 34.8 (*)    All other components within normal limits  TROPONIN I   ____________________________________________  EKG  ED ECG REPORT I, Meliana Canner, the attending physician, personally viewed and interpreted this ECG.  Date: 07/08/2016 EKG Time: 10:30 Rate: 70 Rhythm: normal sinus rhythm QRS Axis: normal Intervals: normal ST/T Wave abnormalities: normal Conduction Disturbances: none Narrative Interpretation: unremarkable, some very slight baseline wander, no evidence of ischemia or heart strain  ____________________________________________  RADIOLOGY  Dg Chest 2 View  Result Date: 07/08/2016 CLINICAL DATA:  SOB X 1 month. Smoker. Hx of asthma. ? Mold issue in house. No fever. c/o wheezing and cough. EXAM: CHEST  2 VIEW COMPARISON:  04/08/2016 FINDINGS: Cardiac silhouette is normal in size and configuration. Normal mediastinal and hilar contours. Lungs are mildly hyperexpanded. There is mild peripheral lower lung interstitial thickening similar to the prior study. There is no lung consolidation to suggest pneumonia. No pulmonary edema. There is mild stable apical pleuroparenchymal scarring. No pleural effusion or pneumothorax. Skeletal structures are unremarkable. IMPRESSION: 1. No acute  cardiopulmonary disease. 2. Lungs are hyperexpanded with mild interstitial thickening, stable. Electronically Signed   By: Amie Portlandavid  Ormond M.D.   On: 07/08/2016 11:19    ____________________________________________   PROCEDURES  Procedure(s) performed: None  Procedures  Critical Care performed: No  ____________________________________________   INITIAL IMPRESSION / ASSESSMENT AND PLAN / ED COURSE  Pertinent labs & imaging results that were available during my care of the patient were reviewed by me and considered in my medical decision making (see chart for details).  Patient is for evaluation of dry nonproductive cough, occasional wheezing improved by home use of albuterol. This is been ongoing for about a month now, and given the chronicity and persisted smoking history and a history of asthma suspect she has mild ongoing bronchospasm and likely rhonchi to us whether it is acute on chronic is somewhat questionable. She does not demonstrate any evidence of acute coronary syndrome. In addition, she has no risk factors for pulmonary embolism, no pleuritic chest pain.  Heart score < 3.    Pulmonary Embolism Rule-out Criteria (PERC rule)                        If YES to  ANY of the following, the PERC rule is not satisfied and cannot be used to rule out PE in this patient (consider d-dimer or imaging depending on pre-test probability).                      If NO to ALL of the following, AND the clinician's pre-test probability is <15%, the [facility] rule is satisfied and there is no need for further workup (including no need to obtain a d-dimer) as the post-test probability of pulmonary embolism is <2%.                      Mnemonic is HAD CLOTS   H - hormone use (exogenous estrogen)      No. A - age > 50                                                 No. D - DVT/PE history                                      No.   C - coughing blood (hemoptysis)                 No. L - leg swelling,  unilateral                             No. O - O2 Sat on Room Air < 95%                  No. T - tachycardia (HR ? 100)                         No. S - surgery or trauma, recent                      No.   Based on my evaluation of the patient, including application of this decision instrument, further testing to evaluate for pulmonary embolism is not indicated at this time.   ----------------------------------------- 12:54 PM on 07/08/2016 -----------------------------------------  Patient feels improved, resting comfortably. Clear lungs and no distress with normal oxygen saturation. He appears stable for outpatient treatment.  Return precautions and treatment recommendations and follow-up discussed with the patient who is agreeable with the plan.       ____________________________________________   FINAL CLINICAL IMPRESSION(S) / ED DIAGNOSES  Final diagnoses:  Mild intermittent asthma with exacerbation  Acute bronchitis, unspecified organism      NEW MEDICATIONS STARTED DURING THIS VISIT:  New Prescriptions   No medications on file     Note:  This document was prepared using Dragon voice recognition software and may include unintentional dictation errors.     Sharyn Creamer, MD 07/08/16 1256

## 2016-07-08 NOTE — ED Triage Notes (Signed)
States chest pain and SOB, denies any cough, states hx of asthma and bronchitis, states symptoms for 1 month, pt awake and alert in no acute distress

## 2016-07-22 ENCOUNTER — Emergency Department
Admission: EM | Admit: 2016-07-22 | Discharge: 2016-07-22 | Disposition: A | Discharge status: Home / Self Care | Payer: No Typology Code available for payment source | Attending: Student in an Organized Health Care Education/Training Program | Admitting: Student in an Organized Health Care Education/Training Program

## 2016-07-22 ENCOUNTER — Emergency Department: Payer: No Typology Code available for payment source

## 2016-07-22 ENCOUNTER — Encounter: Payer: Self-pay | Admitting: Emergency Medicine

## 2016-07-22 DIAGNOSIS — Z79899 Other long term (current) drug therapy: Secondary | ICD-10-CM | POA: Diagnosis not present

## 2016-07-22 DIAGNOSIS — F17210 Nicotine dependence, cigarettes, uncomplicated: Secondary | ICD-10-CM | POA: Insufficient documentation

## 2016-07-22 DIAGNOSIS — J45909 Unspecified asthma, uncomplicated: Secondary | ICD-10-CM | POA: Diagnosis not present

## 2016-07-22 DIAGNOSIS — R519 Headache, unspecified: Secondary | ICD-10-CM

## 2016-07-22 DIAGNOSIS — Z9049 Acquired absence of other specified parts of digestive tract: Secondary | ICD-10-CM | POA: Diagnosis not present

## 2016-07-22 DIAGNOSIS — I159 Secondary hypertension, unspecified: Secondary | ICD-10-CM | POA: Diagnosis not present

## 2016-07-22 DIAGNOSIS — R51 Headache: Secondary | ICD-10-CM | POA: Diagnosis present

## 2016-07-22 LAB — COMPREHENSIVE METABOLIC PANEL
ALBUMIN: 4.4 g/dL (ref 3.5–5.0)
ALK PHOS: 83 U/L (ref 38–126)
ALT: 9 U/L — ABNORMAL LOW (ref 14–54)
ANION GAP: 2 — AB (ref 5–15)
AST: 22 U/L (ref 15–41)
BILIRUBIN TOTAL: 0.5 mg/dL (ref 0.3–1.2)
BUN: 13 mg/dL (ref 6–20)
CALCIUM: 9.4 mg/dL (ref 8.9–10.3)
CO2: 27 mmol/L (ref 22–32)
Chloride: 110 mmol/L (ref 101–111)
Creatinine, Ser: 0.95 mg/dL (ref 0.44–1.00)
GFR calc Af Amer: >60 mL/min (ref >60)
GLUCOSE: 122 mg/dL — AB (ref 65–99)
Potassium: 3.2 mmol/L — ABNORMAL LOW (ref 3.5–5.1)
Sodium: 139 mmol/L (ref 135–145)
TOTAL PROTEIN: 7.7 g/dL (ref 6.5–8.1)

## 2016-07-22 LAB — CBC
HEMATOCRIT: 40.1 % (ref 35.0–47.0)
HEMOGLOBIN: 14 g/dL (ref 12.0–16.0)
MCH: 34.7 pg — ABNORMAL HIGH (ref 26.0–34.0)
MCHC: 34.9 g/dL (ref 32.0–36.0)
MCV: 99.4 fL (ref 80.0–100.0)
Platelets: 227 10*3/uL (ref 150–440)
RBC: 4.04 MIL/uL (ref 3.80–5.20)
RDW: 12.4 % (ref 11.5–14.5)
WBC: 10.1 10*3/uL (ref 3.6–11.0)

## 2016-07-22 LAB — TROPONIN I

## 2016-07-22 MED ORDER — AMLODIPINE BESYLATE 5 MG PO TABS: 10.0000 mg | ORAL_TABLET | Freq: Once | ORAL | Status: DC

## 2016-07-22 MED ORDER — ACETAMINOPHEN 500 MG PO TABS
1000.0000 mg | ORAL_TABLET | Freq: Once | ORAL | Status: AC
  Administered 2016-07-22: 1000 mg via ORAL
  Filled 2016-07-22: qty 2

## 2016-07-22 MED ORDER — PROCHLORPERAZINE EDISYLATE 5 MG/ML IJ SOLN
10.0000 mg | Freq: Once | INTRAMUSCULAR | Status: AC
  Administered 2016-07-22: 10 mg via INTRAMUSCULAR
  Filled 2016-07-22: qty 2

## 2016-07-22 MED ORDER — AMLODIPINE BESYLATE 5 MG PO TABS
5.0000 mg | ORAL_TABLET | Freq: Once | ORAL | Status: AC
  Administered 2016-07-22: 5 mg via ORAL
  Filled 2016-07-22: qty 1

## 2016-07-22 MED ORDER — AMLODIPINE BESYLATE 5 MG PO TABS: 5.0000 mg | ORAL_TABLET | Freq: Once | ORAL | Status: DC

## 2016-07-22 MED ORDER — HYDROCHLOROTHIAZIDE 12.5 MG PO CAPS: 12.5000 mg | ORAL_CAPSULE | Freq: Every day | ORAL | 0 refills | Status: DC

## 2016-07-22 NOTE — Discharge Instructions (Signed)

## 2016-07-22 NOTE — ED Provider Notes (Signed)
[facility] Emergency Department Provider Note    First MD Initiated Contact with Patient 07/22/16 [phone]     (approximate)  I have reviewed the triage vital signs and the nursing notes.   HISTORY  Chief Complaint Hypertension    HPI Patricia [name] is a 51 y.o. female presents with chief complaint and concern over elevated blood pressure. States she hasn't been taking her blood pressure medications for over a year now. States that she's been having several weeks of headache associated with elevated blood pressure and last night while at work had shooting pain in her left leg and left arm. Denies any chest pain or shortness of breath. No leg swelling. Denies any numbness or tingling at this time. Patient states that she did feel anxious and that that might have precipitated the worsening of her leg pain. Denies any visual disturbance. Does not I blood thinners.   Past Medical History:  Diagnosis Date  . Asthma   . Bronchitis   . Hypertension    No family history on file. Past Surgical History:  Procedure Laterality Date  . CHOLECYSTECTOMY    . TUBAL LIGATION     There are no active problems to display for this patient.     Prior to Admission medications   Medication Sig Start Date End Date Taking? Authorizing Provider  albuterol (PROVENTIL HFA;VENTOLIN HFA) 108 (90 Base) MCG/ACT inhaler Inhale 2 puffs into the lungs every 6 (six) hours as needed for wheezing or shortness of breath. 07/08/16   Sharyn Creamer, MD  azithromycin (ZITHROMAX) 250 MG tablet Take one tablet daily starting 07/09/2016 07/08/16   Sharyn Creamer, MD  predniSONE (DELTASONE) 20 MG tablet Take 2 tablets (40 mg total) by mouth daily. 07/08/16   Sharyn Creamer, MD  prochlorperazine (COMPAZINE) 10 MG tablet Take 1 tablet (10 mg total) by mouth every 6 (six) hours as needed for nausea. 10/27/15   Irean Hong, MD    Allergies Patient has no known allergies.    Social History Social History    Substance Use Topics  . Smoking status: Current Every Day Smoker    Types: Cigarettes  . Smokeless tobacco: Never Used  . Alcohol use Yes     Comment: occassionaly    Review of Systems Patient denies headaches, rhinorrhea, blurry vision, numbness, shortness of breath, chest pain, edema, cough, abdominal pain, nausea, vomiting, diarrhea, dysuria, fevers, rashes or hallucinations unless otherwise stated above in HPI. ____________________________________________   PHYSICAL EXAM:  VITAL SIGNS: Vitals:   07/22/16 0257 07/22/16 0553  BP: (!) 141/100 (!) 138/104  Pulse: 84 68  Resp: 20 18  Temp: 98.3 F (36.8 C) 98.5 F (36.9 C)    Constitutional: Alert and oriented. Well appearing and in no acute distress. Eyes: Conjunctivae are normal. PERRL. EOMI. Head: Atraumatic. Nose: No congestion/rhinnorhea. Mouth/Throat: Mucous membranes are moist.  Oropharynx non-erythematous. Neck: No stridor. Painless ROM. No cervical spine tenderness to palpation Hematological/Lymphatic/Immunilogical: No cervical lymphadenopathy. Cardiovascular: Normal rate, regular rhythm. Grossly normal heart sounds.  Good peripheral circulation. Respiratory: Normal respiratory effort.  No retractions. Lungs CTAB. Gastrointestinal: Soft and nontender. No distention. No abdominal bruits. No CVA tenderness. Musculoskeletal: No lower extremity tenderness nor edema.  No joint effusions. Neurologic:  Normal speech and language. No gross focal neurologic deficits are appreciated. No gait instability. Skin:  Skin is warm, dry and intact. No rash noted. Psychiatric: Mood and affect are normal. Speech and behavior are normal.  ____________________________________________   LABS (all labs  ordered are listed, but only abnormal results are displayed)  Results for orders placed or performed during the hospital encounter of 07/22/16 (from the past 24 hour(s))  CBC     Status: Abnormal   Collection Time: 07/22/16  3:02 AM   Result Value Ref Range   WBC 10.1 3.6 - 11.0 K/uL   RBC 4.04 3.80 - 5.20 MIL/uL   Hemoglobin 14.0 12.0 - 16.0 g/dL   HCT 16.140.1 09.635.0 - 04.547.0 %   MCV 99.4 80.0 - 100.0 fL   MCH 34.7 (H) 26.0 - 34.0 pg   MCHC 34.9 32.0 - 36.0 g/dL   RDW 40.912.4 81.111.5 - 91.414.5 %   Platelets 227 150 - 440 K/uL  Comprehensive metabolic panel     Status: Abnormal   Collection Time: 07/22/16  3:02 AM  Result Value Ref Range   Sodium 139 135 - 145 mmol/L   Potassium 3.2 (L) 3.5 - 5.1 mmol/L   Chloride 110 101 - 111 mmol/L   CO2 27 22 - 32 mmol/L   Glucose, Bld 122 (H) 65 - 99 mg/dL   BUN 13 6 - 20 mg/dL   Creatinine, Ser 7.820.95 0.44 - 1.00 mg/dL   Calcium 9.4 8.9 - 95.610.3 mg/dL   Total Protein 7.7 6.5 - 8.1 g/dL   Albumin 4.4 3.5 - 5.0 g/dL   AST 22 15 - 41 U/L   ALT 9 (L) 14 - 54 U/L   Alkaline Phosphatase 83 38 - 126 U/L   Total Bilirubin 0.5 0.3 - 1.2 mg/dL   GFR calc non Af Amer >60 >60 mL/min   GFR calc Af Amer >60 >60 mL/min   Anion gap 2 (L) 5 - 15  Troponin I     Status: None   Collection Time: 07/22/16  3:02 AM  Result Value Ref Range   Troponin I <0.03 <0.03 ng/mL   ____________________________________________  EKG My review and personal interpretation at Time: 3:00   Indication: htn  Rate: 75  Rhythm: sinus Axis: normal Other: normal intervals, no STEMI or ST depressions ____________________________________________  RADIOLOGY  I personally reviewed all radiographic images ordered to evaluate for the above acute complaints and reviewed radiology reports and findings.  These findings were personally discussed with the patient.  Please see medical record for radiology report.  ____________________________________________   PROCEDURES  Procedure(s) performed:  Procedures    Critical Care performed: no ____________________________________________   INITIAL IMPRESSION / ASSESSMENT AND PLAN / ED COURSE  Pertinent labs & imaging results that were available during my care of the patient  were reviewed by me and considered in my medical decision making (see chart for details).  DDX: htn, acs, tension, dissection, stress, med noncompliance  Patricia [name] is a 51 y.o. who presents to the ED with elevated blood pressure and headache and left leg pain as described above. Patient's afebrile and hypertensive but otherwise well appearing. She has no evidence of meningeal irritation or focal neuro deficits to suggest insidious process such as subarachnoid hemorrhage, stroke or meningitis. Blood work is reassuring. Chest x-ray shows no evidence of mediastinal widening. She has good distal pulses. This is not clinically consistent with dissection. Patient was given a blood pressure medication as well as treatment for her headache with improvement in her blood pressure and headache.   Clinical Course as of Jul 23 706  Fri Jul 22, 2016  [id number] Patient reassessed.  Headache improved. Patient resting comfortably. Chest x-ray shows no acute abnormality or mediastinal widening.  If feel patient is stable for follow-up with PCP.  [PR]    Clinical Course User Index [PR] Willy Eddy, MD     ____________________________________________   FINAL CLINICAL IMPRESSION(S) / ED DIAGNOSES  Final diagnoses:  Secondary hypertension  Acute nonintractable headache, unspecified headache type      NEW MEDICATIONS STARTED DURING THIS VISIT:  New Prescriptions   No medications on file     Note:  This document was prepared using Dragon voice recognition software and may include unintentional dictation errors.    Willy Eddy, MD 07/22/16 [phone]

## 2016-07-22 NOTE — ED Triage Notes (Signed)
Pt in with co hypertension states has not taken blood meds for a year, today co pain from left lower leg up to left upper back.

## 2017-07-15 ENCOUNTER — Other Ambulatory Visit: Payer: Self-pay

## 2017-07-15 ENCOUNTER — Encounter: Payer: Self-pay | Admitting: Emergency Medicine

## 2017-07-15 ENCOUNTER — Emergency Department: Payer: No Typology Code available for payment source

## 2017-07-15 ENCOUNTER — Emergency Department
Admission: EM | Admit: 2017-07-15 | Discharge: 2017-07-15 | Disposition: A | Discharge status: Home / Self Care | Payer: No Typology Code available for payment source | Attending: Emergency Medicine | Admitting: Emergency Medicine

## 2017-07-15 DIAGNOSIS — R4702 Dysphasia: Secondary | ICD-10-CM | POA: Insufficient documentation

## 2017-07-15 DIAGNOSIS — J45909 Unspecified asthma, uncomplicated: Secondary | ICD-10-CM | POA: Insufficient documentation

## 2017-07-15 DIAGNOSIS — E041 Nontoxic single thyroid nodule: Secondary | ICD-10-CM | POA: Insufficient documentation

## 2017-07-15 DIAGNOSIS — Z79899 Other long term (current) drug therapy: Secondary | ICD-10-CM | POA: Insufficient documentation

## 2017-07-15 DIAGNOSIS — F1210 Cannabis abuse, uncomplicated: Secondary | ICD-10-CM | POA: Insufficient documentation

## 2017-07-15 DIAGNOSIS — R2243 Localized swelling, mass and lump, lower limb, bilateral: Secondary | ICD-10-CM | POA: Insufficient documentation

## 2017-07-15 DIAGNOSIS — H9201 Otalgia, right ear: Secondary | ICD-10-CM | POA: Insufficient documentation

## 2017-07-15 DIAGNOSIS — R0602 Shortness of breath: Secondary | ICD-10-CM | POA: Insufficient documentation

## 2017-07-15 DIAGNOSIS — I10 Essential (primary) hypertension: Secondary | ICD-10-CM | POA: Insufficient documentation

## 2017-07-15 DIAGNOSIS — F17210 Nicotine dependence, cigarettes, uncomplicated: Secondary | ICD-10-CM | POA: Insufficient documentation

## 2017-07-15 LAB — COMPREHENSIVE METABOLIC PANEL
ALBUMIN: 4.3 g/dL (ref 3.5–5.0)
ALK PHOS: 86 U/L (ref 38–126)
ALT: 9 U/L — AB (ref 14–54)
ANION GAP: 7 (ref 5–15)
AST: 22 U/L (ref 15–41)
BILIRUBIN TOTAL: 0.9 mg/dL (ref 0.3–1.2)
BUN: 13 mg/dL (ref 6–20)
CHLORIDE: 105 mmol/L (ref 101–111)
CO2: 25 mmol/L (ref 22–32)
Calcium: 9.1 mg/dL (ref 8.9–10.3)
Creatinine, Ser: 0.89 mg/dL (ref 0.44–1.00)
GFR calc non Af Amer: >60 mL/min (ref >60)
Glucose, Bld: 124 mg/dL — ABNORMAL HIGH (ref 65–99)
Potassium: 4 mmol/L (ref 3.5–5.1)
SODIUM: 137 mmol/L (ref 135–145)
Total Protein: 7.5 g/dL (ref 6.5–8.1)

## 2017-07-15 LAB — CBC WITH DIFFERENTIAL/PLATELET
BASOS PCT: 0 %
Basophils Absolute: 0 10*3/uL (ref 0–0.1)
Eosinophils Absolute: 0 10*3/uL (ref 0–0.7)
Eosinophils Relative: 0 %
HEMATOCRIT: 39.4 % (ref 35.0–47.0)
HEMOGLOBIN: 13.7 g/dL (ref 12.0–16.0)
Lymphocytes Relative: 27 %
Lymphs Abs: 2.4 10*3/uL (ref 1.0–3.6)
MCH: 34.9 pg — ABNORMAL HIGH (ref 26.0–34.0)
MCHC: 34.8 g/dL (ref 32.0–36.0)
MCV: 100.2 fL — ABNORMAL HIGH (ref 80.0–100.0)
Monocytes Absolute: 0.6 10*3/uL (ref 0.2–0.9)
Monocytes Relative: 7 %
NEUTROS ABS: 5.7 10*3/uL (ref 1.4–6.5)
NEUTROS PCT: 66 %
Platelets: 226 10*3/uL (ref 150–440)
RBC: 3.94 MIL/uL (ref 3.80–5.20)
RDW: 12.6 % (ref 11.5–14.5)
WBC: 8.7 10*3/uL (ref 3.6–11.0)

## 2017-07-15 LAB — T4, FREE: FREE T4: 0.9 ng/dL (ref 0.82–1.77)

## 2017-07-15 LAB — TSH: TSH: 0.936 u[IU]/mL (ref 0.350–4.500)

## 2017-07-15 MED ORDER — SODIUM CHLORIDE 0.9 % IV BOLUS
1000.0000 mL | Freq: Once | INTRAVENOUS | Status: AC
  Administered 2017-07-15: 1000 mL via INTRAVENOUS

## 2017-07-15 MED ORDER — DIAZEPAM 5 MG PO TABS: 5.0000 mg | ORAL_TABLET | Freq: Three times a day (TID) | ORAL | 0 refills | Status: AC | PRN

## 2017-07-15 MED ORDER — IOPAMIDOL (ISOVUE-300) INJECTION 61%
75.0000 mL | Freq: Once | INTRAVENOUS | Status: AC | PRN
  Administered 2017-07-15: 75 mL via INTRAVENOUS

## 2017-07-15 MED ORDER — DIAZEPAM 5 MG PO TABS
5.0000 mg | ORAL_TABLET | Freq: Once | ORAL | Status: AC
  Administered 2017-07-15: 5 mg via ORAL
  Filled 2017-07-15: qty 1

## 2017-07-15 MED ORDER — KETOROLAC TROMETHAMINE 30 MG/ML IJ SOLN
15.0000 mg | Freq: Once | INTRAMUSCULAR | Status: AC
  Administered 2017-07-15: 15 mg via INTRAVENOUS
  Filled 2017-07-15: qty 1

## 2017-07-15 MED ORDER — IBUPROFEN 600 MG PO TABS: 600.0000 mg | ORAL_TABLET | Freq: Three times a day (TID) | ORAL | 0 refills | Status: DC | PRN

## 2017-07-15 NOTE — ED Notes (Signed)
Discussed with dr Derrill Kay, no TSH at this time, will be seen by provider

## 2017-07-15 NOTE — ED Triage Notes (Addendum)
"  I would like my thyroid checked". Pt reports hx of hyperactive thyroid and is not longer on medicine but now thinks it is slow.  C/o pain to neck/throat and difficulty swallowing but reports no problems getting food to pass. Also c/o generalized spasms that will come and go.  Difficult to see back of throat as pt reports cannot open mouth far r/t pain.  No fevers.  Reports has gained some weight over last month but not sure how much.  Unlabored.

## 2017-07-15 NOTE — Discharge Instructions (Signed)
It was a pleasure to take care of you today, and thank you for coming to our emergency department.  If you have any questions or concerns before leaving please ask the nurse to grab me and I'm more than happy to go through your aftercare instructions again.  If you were prescribed any opioid pain medication today such as Norco, Vicodin, Percocet, morphine, hydrocodone, or oxycodone please make sure you do not drive when you are taking this medication as it can alter your ability to drive safely.  If you have any concerns once you are home that you are not improving or are in fact getting worse before you can make it to your follow-up appointment, please do not hesitate to call [phone] and come back for further evaluation.  Merrily Brittle, MD  Results for orders placed or performed during the hospital encounter of 07/15/17  CBC with Differential  Result Value Ref Range   WBC 8.7 3.6 - 11.0 K/uL   RBC 3.94 3.80 - 5.20 MIL/uL   Hemoglobin 13.7 12.0 - 16.0 g/dL   HCT 16.1 09.6 - 04.5 %   MCV 100.2 (H) 80.0 - 100.0 fL   MCH 34.9 (H) 26.0 - 34.0 pg   MCHC 34.8 32.0 - 36.0 g/dL   RDW 40.9 81.1 - 91.4 %   Platelets 226 150 - 440 K/uL   Neutrophils Relative % 66 %   Neutro Abs 5.7 1.4 - 6.5 K/uL   Lymphocytes Relative 27 %   Lymphs Abs 2.4 1.0 - 3.6 K/uL   Monocytes Relative 7 %   Monocytes Absolute 0.6 0.2 - 0.9 K/uL   Eosinophils Relative 0 %   Eosinophils Absolute 0.0 0 - 0.7 K/uL   Basophils Relative 0 %   Basophils Absolute 0.0 0 - 0.1 K/uL  Comprehensive metabolic panel  Result Value Ref Range   Sodium 137 135 - 145 mmol/L   Potassium 4.0 3.5 - 5.1 mmol/L   Chloride 105 101 - 111 mmol/L   CO2 25 22 - 32 mmol/L   Glucose, Bld 124 (H) 65 - 99 mg/dL   BUN 13 6 - 20 mg/dL   Creatinine, Ser 7.82 0.44 - 1.00 mg/dL   Calcium 9.1 8.9 - 95.6 mg/dL   Total Protein 7.5 6.5 - 8.1 g/dL   Albumin 4.3 3.5 - 5.0 g/dL   AST 22 15 - 41 U/L   ALT 9 (L) 14 - 54 U/L   Alkaline Phosphatase 86 38 - 126  U/L   Total Bilirubin 0.9 0.3 - 1.2 mg/dL   GFR calc non Af Amer >60 >60 mL/min   GFR calc Af Amer >60 >60 mL/min   Anion gap 7 5 - 15  TSH  Result Value Ref Range   TSH 0.936 0.350 - 4.500 uIU/mL  T4, free  Result Value Ref Range   Free T4 0.90 0.82 - 1.77 ng/dL   Dg Chest 2 View  Result Date: 07/15/2017 CLINICAL DATA:  52 year old female with a history of thyroid problems EXAM: CHEST - 2 VIEW COMPARISON:  07/22/2016 FINDINGS: The heart size and mediastinal contours are within normal limits. Both lungs are clear. The visualized skeletal structures are unremarkable. IMPRESSION: Negative for acute cardiopulmonary disease Electronically Signed   By: Gilmer Mor D.O.   On: 07/15/2017 12:31   Ct Soft Tissue Neck W Contrast  Result Date: 07/15/2017 CLINICAL DATA:  Palpable nodule or thyroid enlargement. Right-sided neck pain with trouble swallowing. EXAM: CT NECK WITH CONTRAST TECHNIQUE: Multidetector CT  imaging of the neck was performed using the standard protocol following the bolus administration of intravenous contrast. CONTRAST:  75mL ISOVUE-300 IOPAMIDOL (ISOVUE-300) INJECTION 61% COMPARISON:  None. FINDINGS: Pharynx and larynx: Negative.  No swelling or masslike finding Salivary glands: No inflammation, mass, or stone. Two or three small foci of fat density within the left parotid that is considered incidental. Thyroid: Left thyroid nodule that is primarily cystic with septations, up to 3 cm. Negative right lobe. Lymph nodes: None enlarged or abnormal density. Vascular: Negative. Limited intracranial: Negative. Visualized orbits: Negative Mastoids and visualized paranasal sinuses: Small retention cysts on the floors of the maxillary sinuses. No active sinusitis. Skeleton: Negative Upper chest: Negative IMPRESSION: 1. No acute finding. 2. 3 cm left thyroid nodule, recommend outpatient thyroid ultrasound if not previously obtained. Electronically Signed   By: Marnee Spring M.D.   On: 07/15/2017  12:26   While here in the ER today you received very powerful medicine that makes it unsafe for you to drive for the rest of the day.  Do not drive until tomorrow.

## 2017-07-15 NOTE — ED Provider Notes (Signed)
[facility] Emergency Department Provider Note  ____________________________________________   First MD Initiated Contact with Patient 07/15/17 1117     (approximate)  I have reviewed the triage vital signs and the nursing notes.   HISTORY  Chief Complaint Dysphagia   HPI Patricia [name] is a 52 y.o. female self presents to the emergency department with 2 days of neck pain and stiffness.  Associated with difficulty swallowing.  She is able to swallow food although it hurts.  No fevers or chills.  She reports "fullness" under her right jaw.  Some discomfort in her right ear greater than her left ear.  No chest pain.  Mild shortness of breath.  She has a remote history of an unknown issue with her thyroid although has not had her TSH or free T4 checked in "years".  She has not taken any thyroid medication in several years.  She does report some bilateral lower extremity swelling for the past several months.  No drooling.  No recent dental work.  Her symptoms came on gradually and have been constant.  Worse with movement improved with rest.  Past Medical History:  Diagnosis Date  . Asthma   . Bronchitis   . Hypertension     There are no active problems to display for this patient.   Past Surgical History:  Procedure Laterality Date  . CHOLECYSTECTOMY    . TUBAL LIGATION      Prior to Admission medications   Medication Sig Start Date End Date Taking? Authorizing Provider  albuterol (PROVENTIL HFA;VENTOLIN HFA) 108 (90 Base) MCG/ACT inhaler Inhale 2 puffs into the lungs every 6 (six) hours as needed for wheezing or shortness of breath. 07/08/16   Sharyn Creamer, MD  azithromycin (ZITHROMAX) 250 MG tablet Take one tablet daily starting 07/09/2016 07/08/16   Sharyn Creamer, MD  diazepam (VALIUM) 5 MG tablet Take 1 tablet (5 mg total) by mouth every 8 (eight) hours as needed for muscle spasms. 07/15/17 07/15/18  Merrily Brittle, MD  hydrochlorothiazide (MICROZIDE) 12.5  MG capsule Take 1 capsule (12.5 mg total) by mouth daily. 07/22/16   Willy Eddy, MD  ibuprofen (ADVIL,MOTRIN) 600 MG tablet Take 1 tablet (600 mg total) by mouth every 8 (eight) hours as needed. 07/15/17   Merrily Brittle, MD  predniSONE (DELTASONE) 20 MG tablet Take 2 tablets (40 mg total) by mouth daily. 07/08/16   Sharyn Creamer, MD  prochlorperazine (COMPAZINE) 10 MG tablet Take 1 tablet (10 mg total) by mouth every 6 (six) hours as needed for nausea. 10/27/15   Irean Hong, MD    Allergies Patient has no known allergies.  History reviewed. No pertinent family history.  Social History Social History   Tobacco Use  . Smoking status: Current Every Day Smoker    Types: Cigarettes  . Smokeless tobacco: Never Used  Substance Use Topics  . Alcohol use: Yes    Comment: occassionaly  . Drug use: Yes    Types: Marijuana    Review of Systems Constitutional: No fever/chills Eyes: No visual changes. ENT: Positive for sore throat. Cardiovascular: Denies chest pain. Respiratory: Positive for shortness of breath. Gastrointestinal: No abdominal pain.  No nausea, no vomiting.  No diarrhea.  No constipation. Genitourinary: Negative for dysuria. Musculoskeletal: Negative for back pain. Skin: Negative for rash. Neurological: Negative for headaches, focal weakness or numbness.   ____________________________________________   PHYSICAL EXAM:  VITAL SIGNS: ED Triage Vitals [07/15/17 0947]  Enc Vitals Group     BP 120/85  Pulse Rate 90     Resp 16     Temp 98.4 F (36.9 C)     Temp Source Oral     SpO2 96 %     Weight 160 lb (72.6 kg)     Height  (1.651 m)     Head Circumference      Peak Flow      Pain Score 10     Pain Loc      Pain Edu?      Excl. in GC?     Constitutional: Alert and oriented x4 appears quite uncomfortable sitting upright in bed with difficulty ranging her neck Eyes: PERRL EOMI. Head: Atraumatic. Nose: No congestion/rhinnorhea. Mouth/Throat:  Some trismus.  Poor dentition although no focal tenderness.  No uvula deviation.  No pharyngeal erythema or exudate Neck: Positive for meningismus.  Tender anteriorly Cardiovascular: Normal rate, regular rhythm. Grossly normal heart sounds.  Good peripheral circulation. Respiratory: Normal respiratory effort.  No retractions. Lungs CTAB and moving good air Gastrointestinal: Soft nontender Musculoskeletal: No lower extremity edema   Neurologic:  Normal speech and language. No gross focal neurologic deficits are appreciated. Skin:  Skin is warm, dry and intact. No rash noted. Psychiatric: Mood and affect are normal. Speech and behavior are normal.    ____________________________________________   DIFFERENTIAL includes but not limited to  Retropharyngeal abscess, peritonsillar abscess, thyrotoxicosis, hypothyroidism, dental infection ____________________________________________   LABS (all labs ordered are listed, but only abnormal results are displayed)  Labs Reviewed  CBC WITH DIFFERENTIAL/PLATELET - Abnormal; Notable for the following components:      Result Value   MCV 100.2 (*)    MCH 34.9 (*)    All other components within normal limits  COMPREHENSIVE METABOLIC PANEL - Abnormal; Notable for the following components:   Glucose, Bld 124 (*)    ALT 9 (*)    All other components within normal limits  TSH  T4, FREE    Lab work reviewed by me with normal thyroid function __________________________________________  EKG   ____________________________________________  RADIOLOGY  CT soft tissue neck reviewed by me shows large left-sided thyroid nodule ____________________________________________   PROCEDURES  Procedure(s) performed: no  Procedures  Critical Care performed: no  Observation: no ____________________________________________   INITIAL IMPRESSION / ASSESSMENT AND PLAN / ED COURSE  Pertinent labs & imaging results that were available during my care  of the patient were reviewed by me and considered in my medical decision making (see chart for details).  Patient arrives quite uncomfortable appearing with a stiff neck and some trismus.  Unclear etiology of her symptoms we will treat her symptomatically with fluids, Toradol, Valium, and a CT scan of her neck are pending while TSH and free T4 are in the lab.     The patient's thyroid functions are within normal limits.  Following Valium and Toradol the patient now is able to range her neck feels significantly improved.  Her CT scan shows no abscess or infectious etiology of her symptoms but does show a 2.4 cm left-sided thyroid nodule.  At this point I will treat her symptomatically with a short course of Valium for home and refer her to ENT for further evaluation and possible biopsy.  Strict return precautions have been given and the patient verbalizes understanding and agreement with the plan. ____________________________________________   FINAL CLINICAL IMPRESSION(S) / ED DIAGNOSES  Final diagnoses:  Thyroid nodule      NEW MEDICATIONS STARTED DURING THIS VISIT:  Discharge Medication List  as of 07/15/2017  2:43 PM    START taking these medications   Details  diazepam (VALIUM) 5 MG tablet Take 1 tablet (5 mg total) by mouth every 8 (eight) hours as needed for muscle spasms., Starting Sat 07/15/2017, Until Sun 07/15/2018, Print    ibuprofen (ADVIL,MOTRIN) 600 MG tablet Take 1 tablet (600 mg total) by mouth every 8 (eight) hours as needed., Starting Sat 07/15/2017, Print         Note:  This document was prepared using Dragon voice recognition software and may include unintentional dictation errors.     Merrily Brittle, MD 07/16/17 2322

## 2017-07-15 NOTE — ED Notes (Signed)
Patient transported to CT 

## 2017-07-20 ENCOUNTER — Other Ambulatory Visit: Payer: Self-pay | Admitting: Unknown Physician Specialty

## 2017-07-20 DIAGNOSIS — E041 Nontoxic single thyroid nodule: Secondary | ICD-10-CM

## 2017-07-26 ENCOUNTER — Ambulatory Visit
Admission: RE | Admit: 2017-07-26 | Discharge: 2017-07-26 | Disposition: A | Discharge status: Home / Self Care | Payer: Self-pay | Source: Ambulatory Visit | Attending: Unknown Physician Specialty | Admitting: Unknown Physician Specialty

## 2017-07-26 DIAGNOSIS — E041 Nontoxic single thyroid nodule: Secondary | ICD-10-CM | POA: Insufficient documentation

## 2017-07-31 ENCOUNTER — Other Ambulatory Visit: Payer: Self-pay | Admitting: Unknown Physician Specialty

## 2017-07-31 DIAGNOSIS — E041 Nontoxic single thyroid nodule: Secondary | ICD-10-CM

## 2018-02-15 ENCOUNTER — Ambulatory Visit: Payer: Self-pay

## 2018-04-04 ENCOUNTER — Other Ambulatory Visit: Payer: Self-pay

## 2018-04-04 ENCOUNTER — Emergency Department
Admission: EM | Admit: 2018-04-04 | Discharge: 2018-04-04 | Disposition: A | Discharge status: Home / Self Care | Payer: Self-pay | Attending: Emergency Medicine | Admitting: Emergency Medicine

## 2018-04-04 ENCOUNTER — Emergency Department: Payer: Self-pay

## 2018-04-04 ENCOUNTER — Encounter: Payer: Self-pay | Admitting: Emergency Medicine

## 2018-04-04 DIAGNOSIS — J111 Influenza due to unidentified influenza virus with other respiratory manifestations: Secondary | ICD-10-CM | POA: Insufficient documentation

## 2018-04-04 DIAGNOSIS — I10 Essential (primary) hypertension: Secondary | ICD-10-CM | POA: Insufficient documentation

## 2018-04-04 DIAGNOSIS — F17210 Nicotine dependence, cigarettes, uncomplicated: Secondary | ICD-10-CM | POA: Insufficient documentation

## 2018-04-04 DIAGNOSIS — J45909 Unspecified asthma, uncomplicated: Secondary | ICD-10-CM | POA: Insufficient documentation

## 2018-04-04 LAB — CBC
HEMATOCRIT: 37.7 % (ref 36.0–46.0)
HEMOGLOBIN: 12.5 g/dL (ref 12.0–15.0)
MCH: 32.8 pg (ref 26.0–34.0)
MCHC: 33.2 g/dL (ref 30.0–36.0)
MCV: 99 fL (ref 80.0–100.0)
Platelets: 233 10*3/uL (ref 150–400)
RBC: 3.81 MIL/uL — ABNORMAL LOW (ref 3.87–5.11)
RDW: 11.6 % (ref 11.5–15.5)
WBC: 7.9 10*3/uL (ref 4.0–10.5)
nRBC: 0 % (ref 0.0–0.2)

## 2018-04-04 LAB — COMPREHENSIVE METABOLIC PANEL
ALT: 9 U/L (ref 0–44)
AST: 21 U/L (ref 15–41)
Albumin: 3.9 g/dL (ref 3.5–5.0)
Alkaline Phosphatase: 78 U/L (ref 38–126)
Anion gap: 5 (ref 5–15)
BUN: 8 mg/dL (ref 6–20)
CHLORIDE: 108 mmol/L (ref 98–111)
CO2: 27 mmol/L (ref 22–32)
Calcium: 8.9 mg/dL (ref 8.9–10.3)
Creatinine, Ser: 0.78 mg/dL (ref 0.44–1.00)
GFR calc Af Amer: >60 mL/min (ref >60)
GLUCOSE: 92 mg/dL (ref 70–99)
POTASSIUM: 3.5 mmol/L (ref 3.5–5.1)
Sodium: 140 mmol/L (ref 135–145)
Total Bilirubin: 0.6 mg/dL (ref 0.3–1.2)
Total Protein: 7.1 g/dL (ref 6.5–8.1)

## 2018-04-04 LAB — URINALYSIS, COMPLETE (UACMP) WITH MICROSCOPIC
BACTERIA UA: NONE SEEN
Bilirubin Urine: NEGATIVE
Glucose, UA: NEGATIVE mg/dL
Ketones, ur: NEGATIVE mg/dL
Leukocytes, UA: NEGATIVE
Nitrite: NEGATIVE
Protein, ur: NEGATIVE mg/dL
SPECIFIC GRAVITY, URINE: 1.019 (ref 1.005–1.030)
pH: 6 (ref 5.0–8.0)

## 2018-04-04 LAB — INFLUENZA PANEL BY PCR (TYPE A & B)
INFLAPCR: NEGATIVE
INFLBPCR: NEGATIVE

## 2018-04-04 LAB — LIPASE, BLOOD: LIPASE: 30 U/L (ref 11–51)

## 2018-04-04 MED ORDER — HYDROCOD POLST-CPM POLST ER 10-8 MG/5ML PO SUER: 5.0000 mL | Freq: Two times a day (BID) | ORAL | 0 refills | Status: DC

## 2018-04-04 MED ORDER — HYDROCOD POLST-CPM POLST ER 10-8 MG/5ML PO SUER
5.0000 mL | Freq: Once | ORAL | Status: AC
  Administered 2018-04-04: 5 mL via ORAL
  Filled 2018-04-04: qty 5

## 2018-04-04 MED ORDER — KETOROLAC TROMETHAMINE 30 MG/ML IJ SOLN
30.0000 mg | Freq: Once | INTRAMUSCULAR | Status: AC
  Administered 2018-04-04: 30 mg via INTRAVENOUS
  Filled 2018-04-04: qty 1

## 2018-04-04 MED ORDER — IBUPROFEN 800 MG PO TABS: 800.0000 mg | ORAL_TABLET | Freq: Three times a day (TID) | ORAL | 0 refills | Status: DC | PRN

## 2018-04-04 MED ORDER — SODIUM CHLORIDE 0.9 % IV SOLN
Freq: Once | INTRAVENOUS | Status: AC
  Administered 2018-04-04: 08:00:00 via INTRAVENOUS

## 2018-04-04 NOTE — ED Provider Notes (Signed)
[facility] Emergency Department Provider Note       Time seen: ----------------------------------------- 7:37 AM on 04/04/2018 -----------------------------------------   I have reviewed the triage vital signs and the nursing notes.  HISTORY   Chief Complaint Flu-like symptoms    HPI Patricia [name] is a 53 y.o. female with a history of asthma, bronchitis and hypertension who presents to the ED for flulike symptoms since Saturday.  She has had fevers, chills, body aches, malaise, cough with diarrhea and vomiting.  She is also had nasal drainage and headache.  Nothing has made her symptoms better or worse.  Patient states she try to go to work yesterday with the symptoms.  Past Medical History:  Diagnosis Date  . Asthma   . Bronchitis   . Hypertension     There are no active problems to display for this patient.   Past Surgical History:  Procedure Laterality Date  . CHOLECYSTECTOMY    . TUBAL LIGATION      Allergies Patient has no known allergies.  Social History Social History   Tobacco Use  . Smoking status: Current Every Day Smoker    Types: Cigarettes  . Smokeless tobacco: Never Used  Substance Use Topics  . Alcohol use: Yes    Comment: occassionaly  . Drug use: Yes    Types: Marijuana   Review of Systems Constitutional: Positive for fevers, chills, body aches Cardiovascular: Negative for chest pain. Respiratory: Negative for shortness of breath.  Positive for cough Gastrointestinal: Negative for abdominal pain, positive for vomiting and diarrhea Musculoskeletal: Positive for myalgias Skin: Negative for rash. Neurological: Negative for headaches, focal weakness or numbness.  All systems negative/normal/unremarkable except as stated in the HPI ____________________________________________   PHYSICAL EXAM:  VITAL SIGNS: ED Triage Vitals  Enc Vitals Group     BP 04/04/18 0625 (!) 143/90     Pulse Rate 04/04/18 0625 87    Resp 04/04/18 0625 20     Temp 04/04/18 0625 98.7 F (37.1 C)     Temp Source 04/04/18 0625 Oral     SpO2 04/04/18 0625 97 %     Weight 04/04/18 0626 162 lb (73.5 kg)     Height 04/04/18 0626 5\' 5"  (1.651 m)     Head Circumference --      Peak Flow --      Pain Score 04/04/18 0626 10     Pain Loc --      Pain Edu? --      Excl. in GC? --    Constitutional: Alert and oriented. Well appearing and in no distress. Eyes: Conjunctivae are normal. Normal extraocular movements. ENT      Head: Normocephalic and atraumatic.      Nose: No congestion/rhinnorhea.      Mouth/Throat: Mucous membranes are moist.      Neck: No stridor. Cardiovascular: Normal rate, regular rhythm. No murmurs, rubs, or gallops. Respiratory: Normal respiratory effort without tachypnea nor retractions. Breath sounds are clear and equal bilaterally. No wheezes/rales/rhonchi. Gastrointestinal: Soft and nontender. Normal bowel sounds Musculoskeletal: Nontender with normal range of motion in extremities. No lower extremity tenderness nor edema. Neurologic:  Normal speech and language. No gross focal neurologic deficits are appreciated.  Skin:  Skin is warm, dry and intact. No rash noted. Psychiatric: Mood and affect are normal. Speech and behavior are normal.  ____________________________________________  ED COURSE:  As part of my medical decision making, I reviewed the following data within the electronic MEDICAL RECORD NUMBER History  obtained from family if available, nursing notes, old chart and ekg, as well as notes from prior ED visits. Patient presented for flulike symptoms, we will assess with labs and imaging as indicated at this time.   Procedures ____________________________________________   LABS (pertinent positives/negatives)  Labs Reviewed  CBC - Abnormal; Notable for the following components:      Result Value   RBC 3.81 (*)    All other components within normal limits  URINALYSIS, COMPLETE (UACMP)  WITH MICROSCOPIC - Abnormal; Notable for the following components:   Color, Urine YELLOW (*)    APPearance CLEAR (*)    Hgb urine dipstick MODERATE (*)    All other components within normal limits  LIPASE, BLOOD  COMPREHENSIVE METABOLIC PANEL  INFLUENZA PANEL BY PCR (TYPE A & B)    RADIOLOGY Images were viewed by me  Chest x-ray  ____________________________________________   DIFFERENTIAL DIAGNOSIS   Influenza, viral syndrome, dehydration, electrolyte abnormality, pneumonia  FINAL ASSESSMENT AND PLAN  Influenza-like illness   Plan: The patient had presented for flulike symptoms. Patient's labs are reassuring. Patient's imaging is also reassuring.  She was given fluids, Toradol Intestinex here.  Clinically she does have influenza and will be treated symptomatically now 5 days into the illness.  She is cleared for outpatient follow-up.   Ulice DashJohnathan E Maikayla Beggs, MD    Note: This note was generated in part or whole with voice recognition software. Voice recognition is usually quite accurate but there are transcription errors that can and very often do occur. I apologize for any typographical errors that were not detected and corrected.     Emily FilbertWilliams, Cormac Wint E, MD 04/04/18 [id number]

## 2018-04-04 NOTE — ED Notes (Signed)
Patient transported to X-ray 

## 2018-04-04 NOTE — ED Triage Notes (Signed)
Patient to ER for c/o flu-like symptoms since Saturday: body aches, general malaise, +cough, believes she had fever, + diarrhea (unknown number of episodes), +vomiting (5 episodes total). Patient with +nasal drainage as well. +HA.

## 2018-05-24 IMAGING — CR DG CHEST 2V
1 series · 2 of 2 positions shown · non-contrast
Comparison: 04/08/2016

CLINICAL DATA: SOB X 1 month. Smoker. Hx of asthma. ? Mold issue in
house. No fever. c/o wheezing and cough.

EXAM:
CHEST  2 VIEW

[Series 1: dg chest 2 view · 0.14mm/px · 2 of 2 slices shown]
[im 1/2]
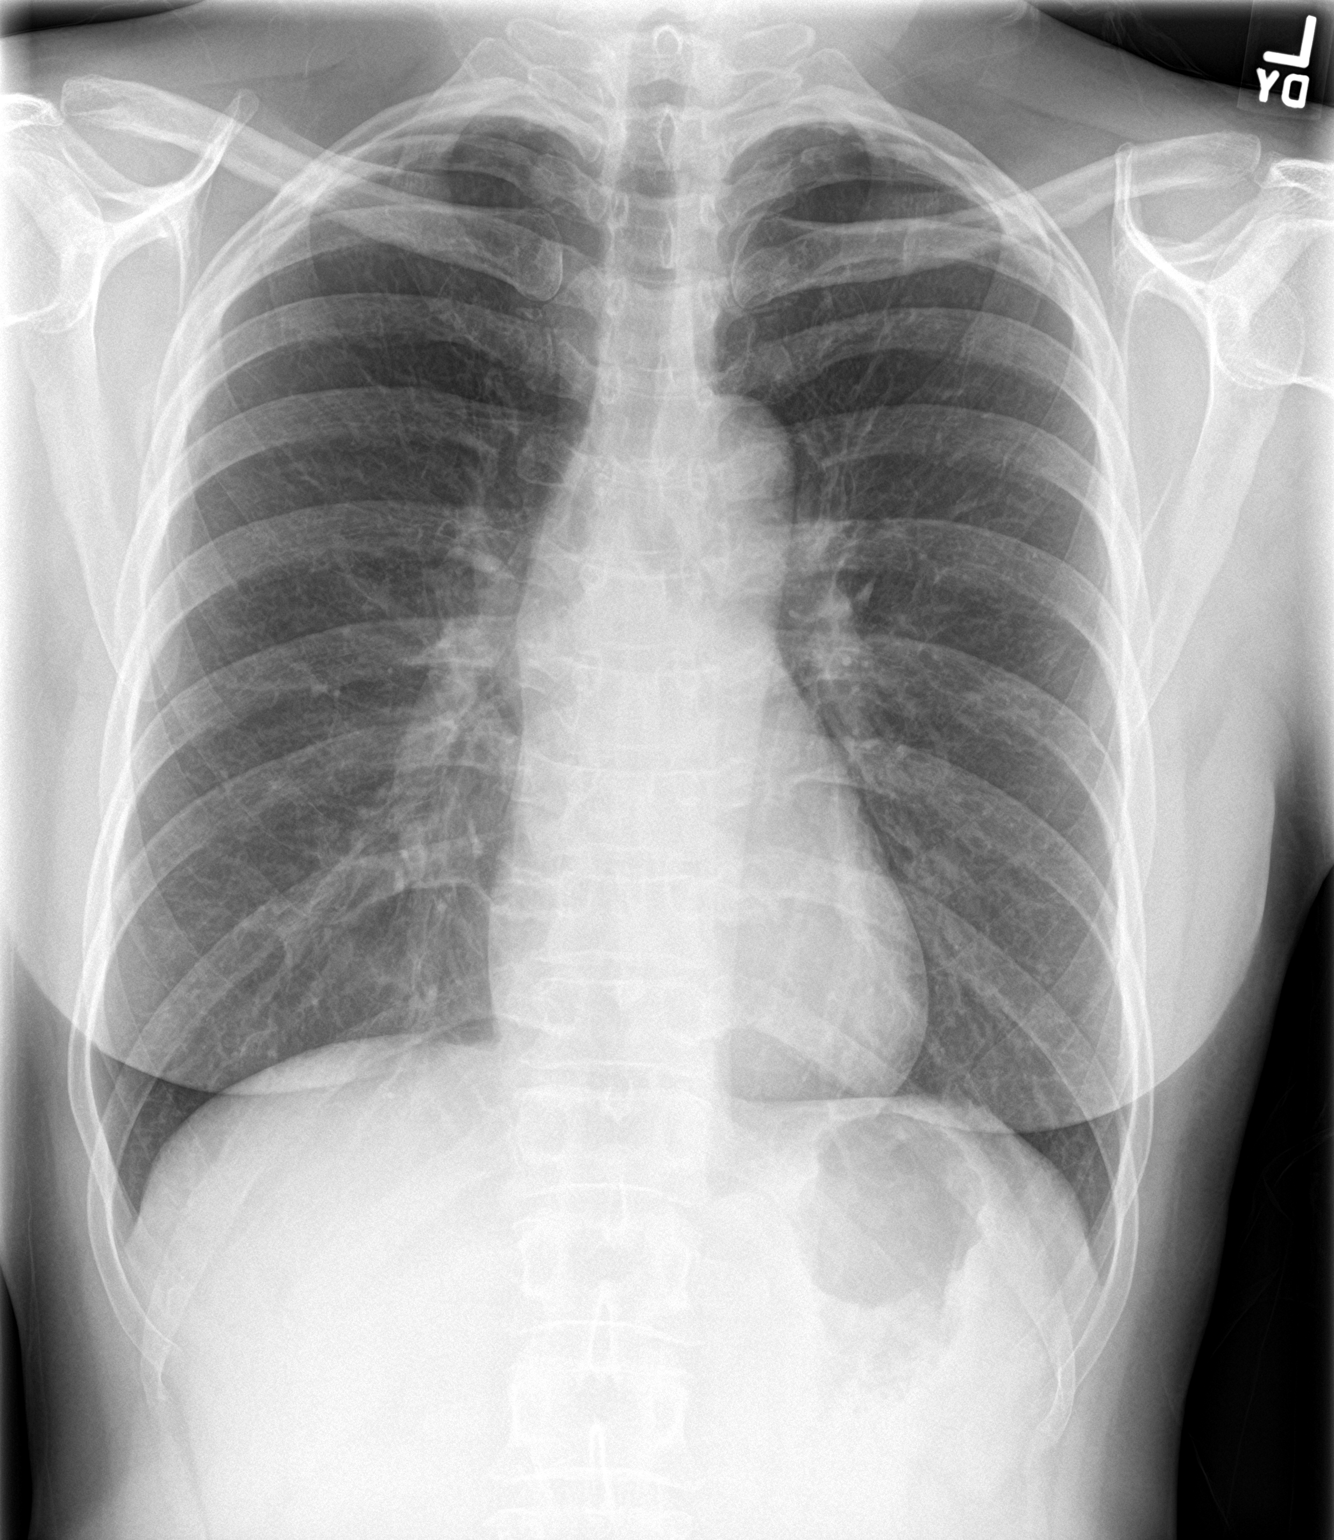
[im 2/2]
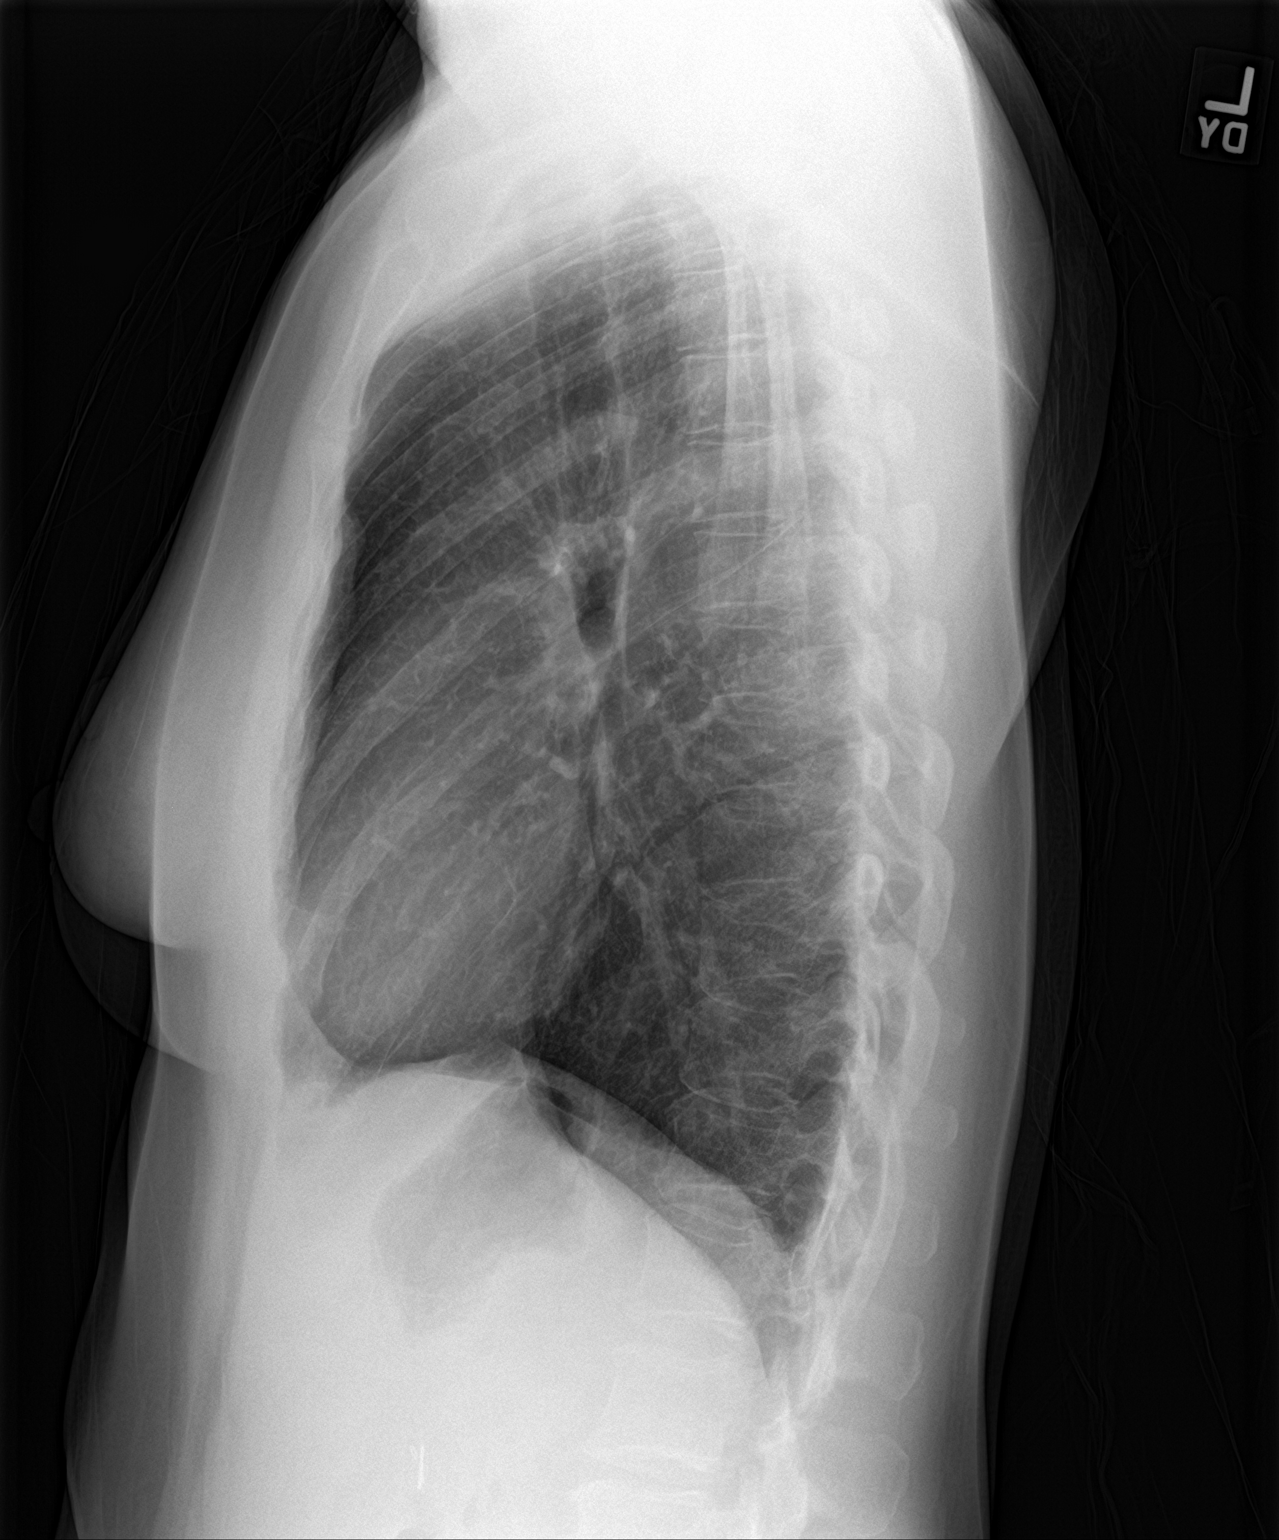

[2 of 2 positions shown; findings below may reference images not displayed]

FINDINGS: Cardiac silhouette is normal in size and configuration. Normal
mediastinal and hilar contours.

Lungs are mildly hyperexpanded. There is mild peripheral lower lung
interstitial thickening similar to the prior study. There is no lung
consolidation to suggest pneumonia. No pulmonary edema. There is
mild stable apical pleuroparenchymal scarring.

No pleural effusion or pneumothorax.

Skeletal structures are unremarkable.
IMPRESSION: 1. No acute cardiopulmonary disease.
2. Lungs are hyperexpanded with mild interstitial thickening,
stable.

## 2018-06-07 IMAGING — DX DG CHEST 1V PORT
1 series · 1 of 1 positions shown · non-contrast
Comparison: 07/08/2016

CLINICAL DATA: Left arm pain. Left upper back pain. History of
hypertension. Smoker.

EXAM:
PORTABLE CHEST 1 VIEW

[chest ap]
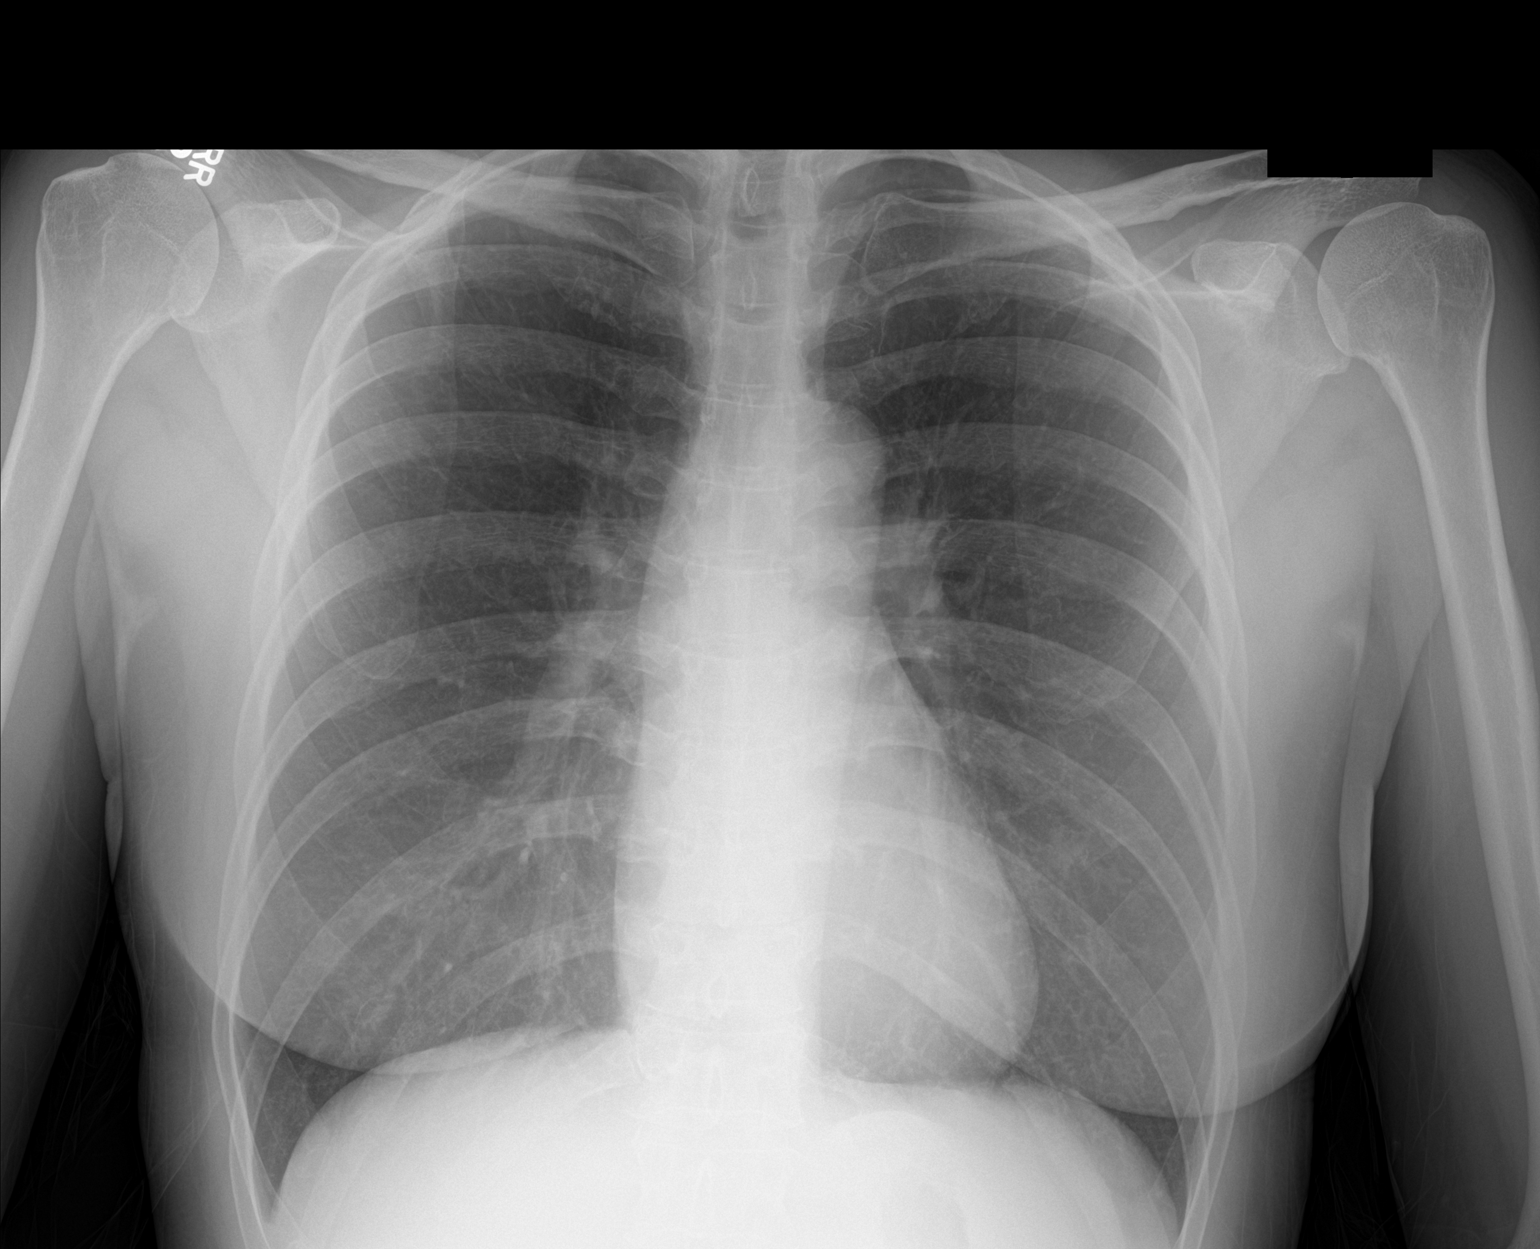

[1 of 1 positions shown; findings below may reference images not displayed]

FINDINGS: Normal heart size and pulmonary vascularity. No focal airspace
disease or consolidation in the lungs. No blunting of costophrenic
angles. No pneumothorax. Mediastinal contours appear intact.
IMPRESSION: No active disease.

## 2018-09-26 ENCOUNTER — Other Ambulatory Visit: Payer: Self-pay

## 2018-09-26 ENCOUNTER — Emergency Department
Admission: EM | Admit: 2018-09-26 | Discharge: 2018-09-26 | Disposition: A | Discharge status: Home / Self Care | Payer: Self-pay | Attending: Emergency Medicine | Admitting: Emergency Medicine

## 2018-09-26 ENCOUNTER — Encounter: Payer: Self-pay | Admitting: Emergency Medicine

## 2018-09-26 ENCOUNTER — Emergency Department: Payer: Self-pay

## 2018-09-26 DIAGNOSIS — S338XXA Sprain of other parts of lumbar spine and pelvis, initial encounter: Secondary | ICD-10-CM

## 2018-09-26 DIAGNOSIS — I10 Essential (primary) hypertension: Secondary | ICD-10-CM | POA: Insufficient documentation

## 2018-09-26 DIAGNOSIS — Y9289 Other specified places as the place of occurrence of the external cause: Secondary | ICD-10-CM | POA: Insufficient documentation

## 2018-09-26 DIAGNOSIS — F1210 Cannabis abuse, uncomplicated: Secondary | ICD-10-CM | POA: Insufficient documentation

## 2018-09-26 DIAGNOSIS — J45909 Unspecified asthma, uncomplicated: Secondary | ICD-10-CM | POA: Insufficient documentation

## 2018-09-26 DIAGNOSIS — X509XXA Other and unspecified overexertion or strenuous movements or postures, initial encounter: Secondary | ICD-10-CM | POA: Insufficient documentation

## 2018-09-26 DIAGNOSIS — F17210 Nicotine dependence, cigarettes, uncomplicated: Secondary | ICD-10-CM | POA: Insufficient documentation

## 2018-09-26 DIAGNOSIS — Y990 Civilian activity done for income or pay: Secondary | ICD-10-CM | POA: Insufficient documentation

## 2018-09-26 DIAGNOSIS — Y9389 Activity, other specified: Secondary | ICD-10-CM | POA: Insufficient documentation

## 2018-09-26 MED ORDER — DIAZEPAM 5 MG PO TABS: 5.0000 mg | ORAL_TABLET | Freq: Four times a day (QID) | ORAL | 0 refills | Status: AC | PRN

## 2018-09-26 MED ORDER — TRAMADOL HCL 50 MG PO TABS: 50.0000 mg | ORAL_TABLET | Freq: Two times a day (BID) | ORAL | 0 refills | Status: AC | PRN

## 2018-09-26 MED ORDER — KETOROLAC TROMETHAMINE 60 MG/2ML IM SOLN
60.0000 mg | Freq: Once | INTRAMUSCULAR | Status: AC
  Administered 2018-09-26: 10:00:00 60 mg via INTRAMUSCULAR
  Filled 2018-09-26: qty 2

## 2018-09-26 MED ORDER — HYDROMORPHONE HCL 1 MG/ML IJ SOLN
1.0000 mg | Freq: Once | INTRAMUSCULAR | Status: AC
  Administered 2018-09-26: 10:00:00 1 mg via INTRAMUSCULAR
  Filled 2018-09-26: qty 1

## 2018-09-26 NOTE — ED Provider Notes (Signed)
[facility] Emergency Department Provider Note   ____________________________________________   First MD Initiated Contact with Patient 09/26/18 [phone]     (approximate)  I have reviewed the triage vital signs and the nursing notes.   HISTORY  Chief Complaint Tailbone Pain    HPI Patricia [name] is a 53 y.o. female patient complains of tailbone pain for 2 days.  Patient that she was bending at work when she felt pain in her low back and tailbone.  Patient denies radicular component to her back pain.  Patient denies bowel/ bladder dysfunction.  Patient rates pain as a 10/10.  Patient got up pain is "achy".  No palliative measure for complaint.      Past Medical History:  Diagnosis Date  . Asthma   . Bronchitis   . Hypertension     There are no active problems to display for this patient.   Past Surgical History:  Procedure Laterality Date  . CHOLECYSTECTOMY    . TUBAL LIGATION      Prior to Admission medications   Medication Sig Start Date End Date Taking? Authorizing Provider  albuterol (PROVENTIL HFA;VENTOLIN HFA) 108 (90 Base) MCG/ACT inhaler Inhale 2 puffs into the lungs every 6 (six) hours as needed for wheezing or shortness of breath. 07/08/16   Sharyn CreamerQuale, Mark, MD  diazepam (VALIUM) 5 MG tablet Take 1 tablet (5 mg total) by mouth every 6 (six) hours as needed for anxiety. 09/26/18 09/26/19  Joni ReiningSmith, Telissa Palmisano K, PA-C  hydrochlorothiazide (MICROZIDE) 12.5 MG capsule Take 1 capsule (12.5 mg total) by mouth daily. 07/22/16   Willy Eddyobinson, Patrick, MD  traMADol (ULTRAM) 50 MG tablet Take 1 tablet (50 mg total) by mouth every 12 (twelve) hours as needed for up to 3 days. 09/26/18 09/29/18  Joni ReiningSmith, Greg Eckrich K, PA-C    Allergies Patient has no known allergies.  No family history on file.  Social History Social History   Tobacco Use  . Smoking status: Current Every Day Smoker    Types: Cigarettes  . Smokeless tobacco: Never Used  Substance Use Topics  .  Alcohol use: Yes    Comment: occassionaly  . Drug use: Yes    Types: Marijuana    Review of Systems  Constitutional: No fever/chills Eyes: No visual changes. ENT: No sore throat. Cardiovascular: Denies chest pain. Respiratory: Denies shortness of breath. Gastrointestinal: No abdominal pain.  No nausea, no vomiting.  No diarrhea.  No constipation. Genitourinary: Negative for dysuria. Musculoskeletal: Positive for back pain. Skin: Negative for rash. Neurological: Negative for headaches, focal weakness or numbness. Endocrine:  Hypertension ____________________________________________   PHYSICAL EXAM:  VITAL SIGNS: ED Triage Vitals  Enc Vitals Group     BP 09/26/18 0940 (!) 149/92     Pulse Rate 09/26/18 0940 77     Resp 09/26/18 0940 16     Temp 09/26/18 0940 98.8 F (37.1 C)     Temp Source 09/26/18 0940 Oral     SpO2 09/26/18 0940 98 %     Weight 09/26/18 0941 170 lb (77.1 kg)     Height 09/26/18 0941 5\' 7"  (1.702 m)     Head Circumference --      Peak Flow --      Pain Score 09/26/18 0941 10     Pain Loc --      Pain Edu? --      Excl. in GC? --     Constitutional: Alert and oriented. Well appearing and in no acute distress.  Cardiovascular: Normal rate, regular rhythm. Grossly normal heart sounds.  Good peripheral circulation. Respiratory: Normal respiratory effort.  No retractions. Lungs CTAB. Genitourinary: Deferred Musculoskeletal: No obvious spinal deformity.  Patient moderate guarding palpation L4-S1.  In supine position patient negative bilateral straight leg test. Neurologic:  Normal speech and language. No gross focal neurologic deficits are appreciated. No gait instability. Skin:  Skin is warm, dry and intact. No rash noted. Psychiatric: Mood and affect are normal. Speech and behavior are normal.  ____________________________________________   LABS (all labs ordered are listed, but only abnormal results are displayed)  Labs Reviewed - No data to  display ____________________________________________  EKG   ____________________________________________  RADIOLOGY  ED MD interpretation:    Official radiology report(s): Dg Sacrum/coccyx  Result Date: 09/26/2018 CLINICAL DATA:  Felt pain and low back/tailbone region after bending over at work yesterday. EXAM: SACRUM AND COCCYX - 2+ VIEW COMPARISON:  01/11/2012 and CT 03/19/2014 FINDINGS: Sacrum and coccyx are normal. Remaining bony structures are unremarkable. Stable 4 mm calcification between the right transverse processes of L4 and L5 likely venous calcification as shown on CT. IMPRESSION: No acute findings. Electronically Signed   By: Marin Olp M.D.   On: 09/26/2018 10:45    ____________________________________________   PROCEDURES  Procedure(s) performed (including Critical Care):  Procedures   ____________________________________________   INITIAL IMPRESSION / ASSESSMENT AND PLAN / ED COURSE  As part of my medical decision making, I reviewed the following data within the [place] was evaluated in Emergency Department on 09/26/2018 for the symptoms described in the history of present illness. She was evaluated in the context of the global COVID-19 pandemic, which necessitated consideration that the patient might be at risk for infection with the SARS-CoV-2 virus that causes COVID-19. Institutional protocols and algorithms that pertain to the evaluation of patients at risk for COVID-19 are in a state of rapid change based on information released by regulatory bodies including the CDC and federal and state organizations. These policies and algorithms were followed during the patient's care in the ED.   Patient presents with acute coccyx pain secondary to a flexion incident.  Patient physical exam was unremarkable except for decreased range of motion with flexion.  Discussed x-ray findings with patient.  Patient given discharge  care instructions and advised take medication as directed.  Patient advised follow-up PCP if there is no improvement in 3 to 5 days.      ____________________________________________   FINAL CLINICAL IMPRESSION(S) / ED DIAGNOSES  Final diagnoses:  Sacrum sprain, initial encounter     ED Discharge Orders         Ordered    traMADol (ULTRAM) 50 MG tablet  Every 12 hours PRN     09/26/18 1052    diazepam (VALIUM) 5 MG tablet  Every 6 hours PRN     09/26/18 1052           Note:  This document was prepared using Dragon voice recognition software and may include unintentional dictation errors.    Sable Feil, PA-C 09/26/18 1056    Harvest Dark, MD 09/26/18 1300

## 2018-09-26 NOTE — ED Triage Notes (Signed)
Pt states she bending over at work yesterday and felt a pain in her lower back/tailbone, today is in a lot of pain, NAD.

## 2018-09-26 NOTE — ED Notes (Signed)
See triage note  Presents with mod/lower back pain  Area is tender on palpation at tailbone area Denies any fall

## 2019-05-31 IMAGING — CR DG CHEST 2V
1 series · 2 of 2 positions shown · non-contrast
Comparison: 07/22/2016

CLINICAL DATA: 52-year-old female with a history of thyroid
problems

EXAM:
CHEST - 2 VIEW

[Series 1: dg chest 2 view · 0.14mm/px · 2 of 2 slices shown]
[im 1/2]
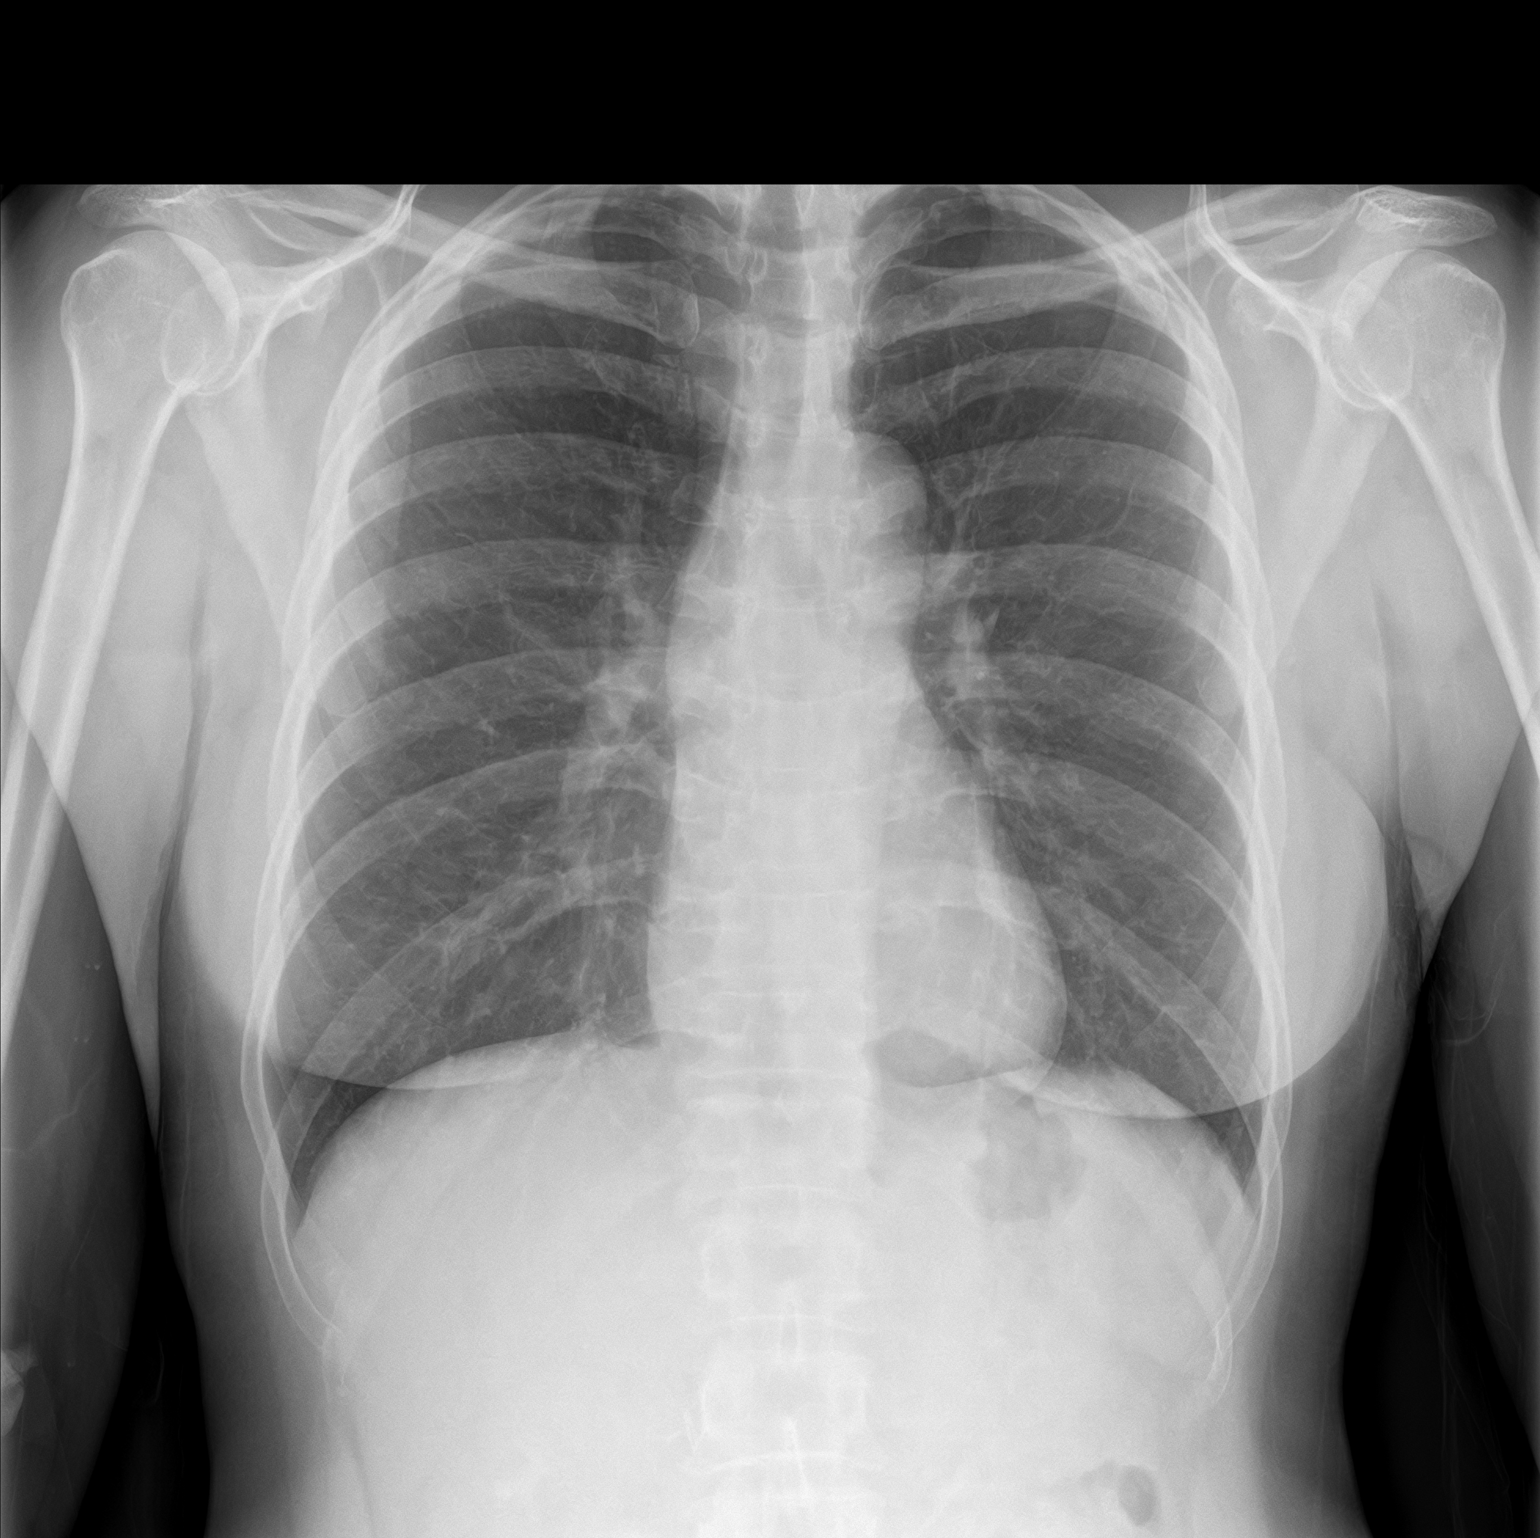
[im 2/2]
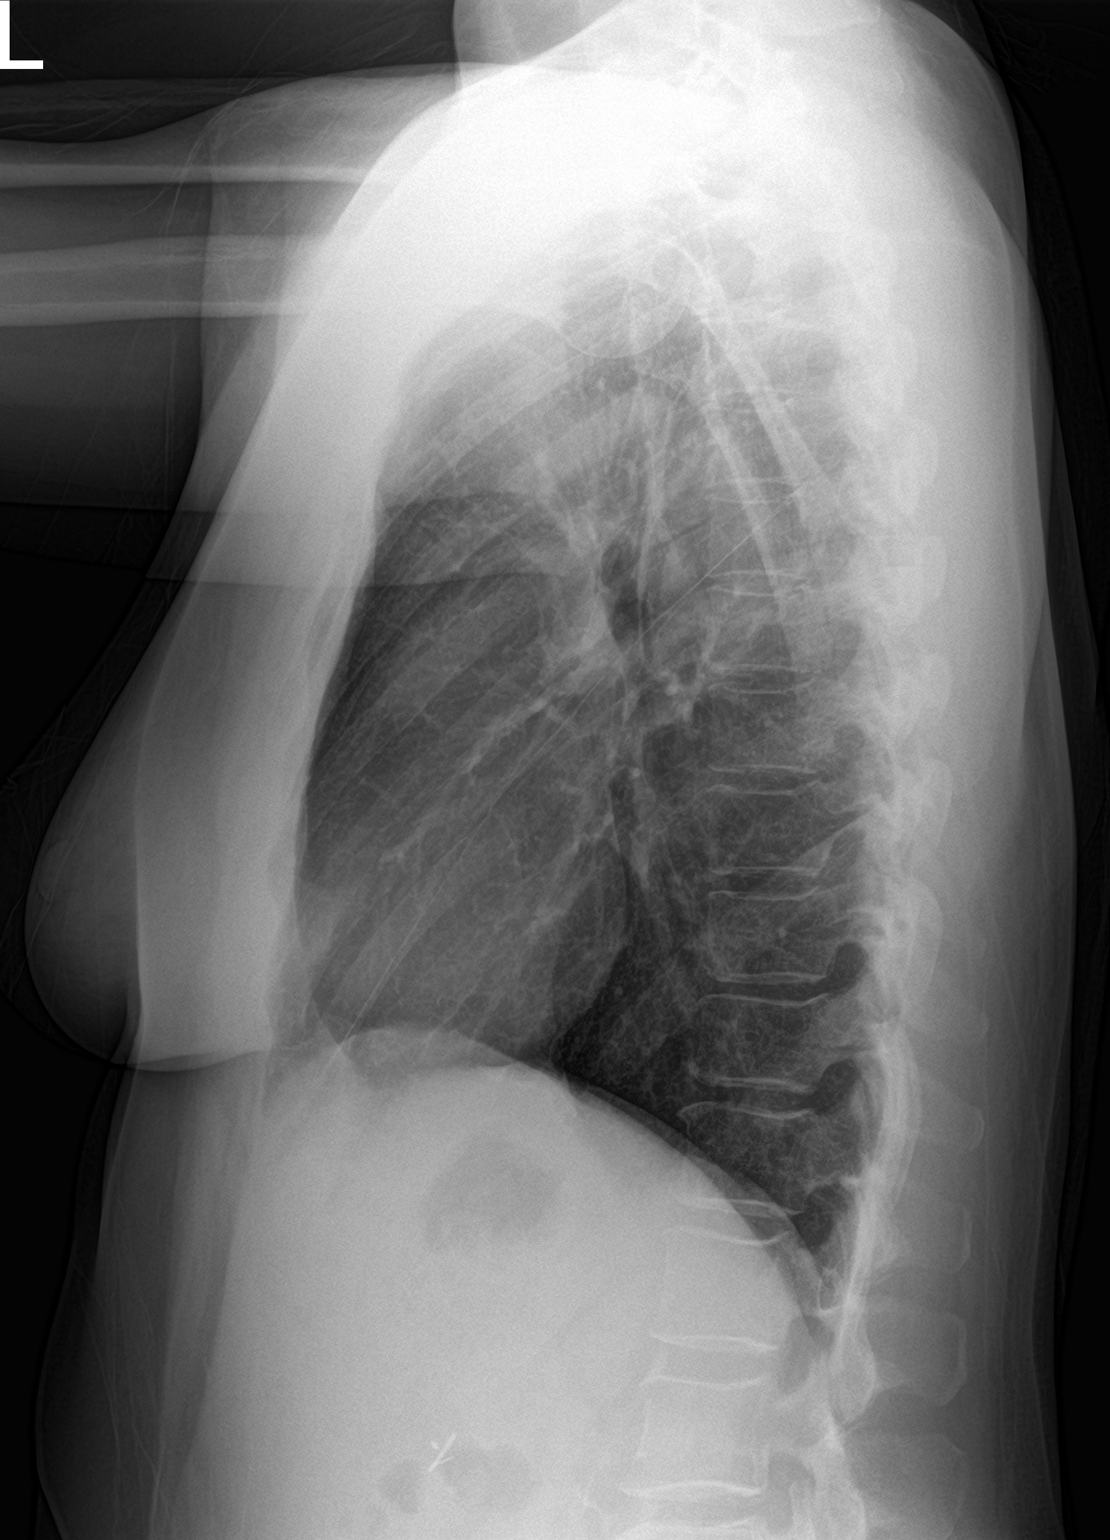

[2 of 2 positions shown; findings below may reference images not displayed]

FINDINGS: The heart size and mediastinal contours are within normal limits.
Both lungs are clear. The visualized skeletal structures are
unremarkable.
IMPRESSION: Negative for acute cardiopulmonary disease

## 2019-05-31 IMAGING — CT CT NECK W/ CM
4 of 5 series · 15 of 33 positions shown, 17 images · IV contrast (iopamidol)
Comparison: None.

CLINICAL DATA: Palpable nodule or thyroid enlargement. Right-sided
neck pain with trouble swallowing.

EXAM:
CT NECK WITH CONTRAST
TECHNIQUE: Multidetector CT imaging of the neck was performed using the
standard protocol following the bolus administration of intravenous
contrast.
CONTRAST:  75mL [id number] IOPAMIDOL ([id number]-LII) INJECTION 61%

[Series 2: axial neck · axial · 0.52mm/px · z∈[-253,-133]mm · 3 of 122 slices shown]
[im 31/122  bone]
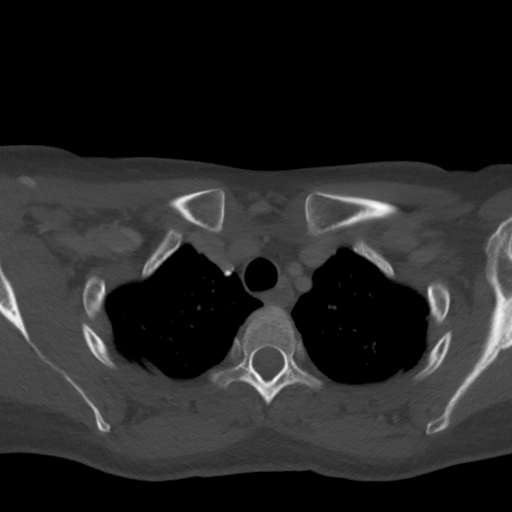
[im 61/122  bone]
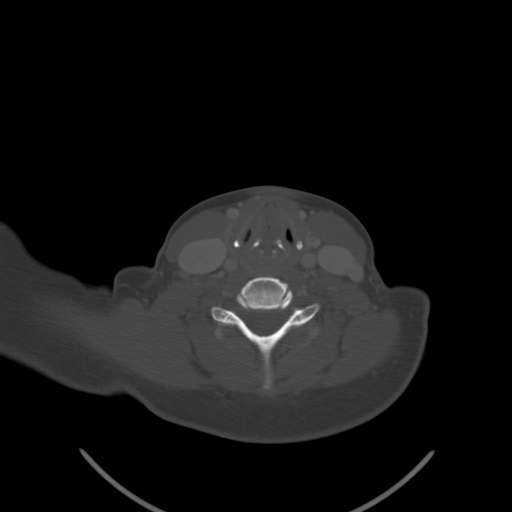
[im 91/122  bone]
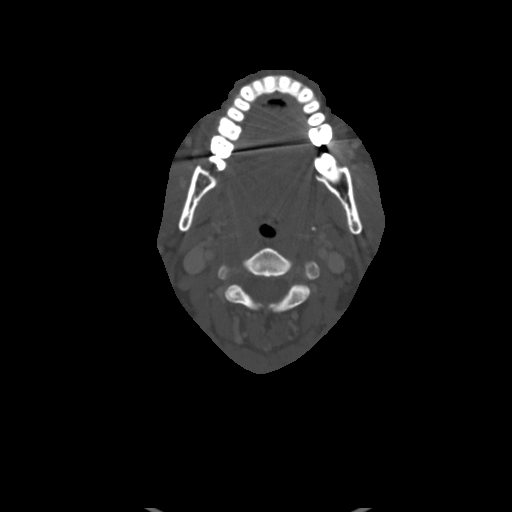

[Series 6: sag neck · sagittal · 0.46mm/px · 5 of 126 slices shown, 6 images]
[im 42/126  bone]
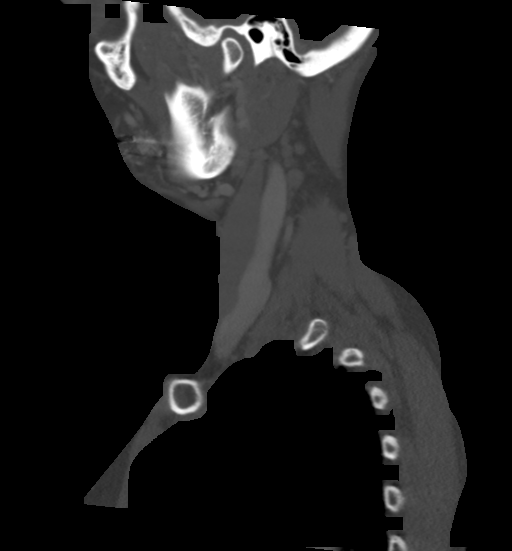
[im 53/126  bone]
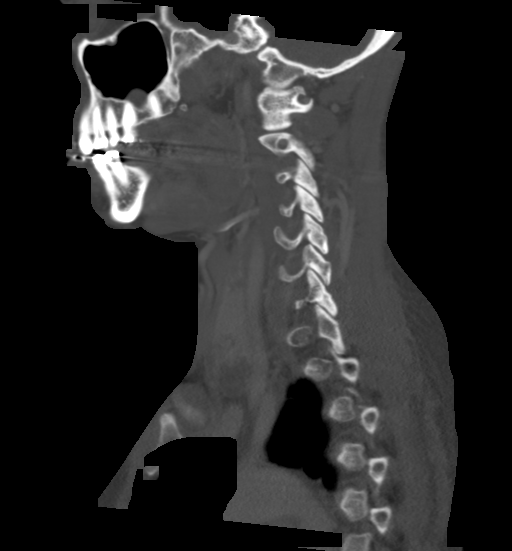
[im 63/126  soft-tissue]
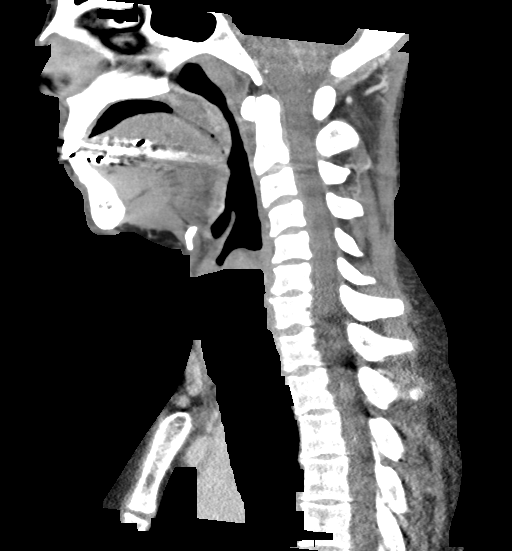
[im 63/126  bone]
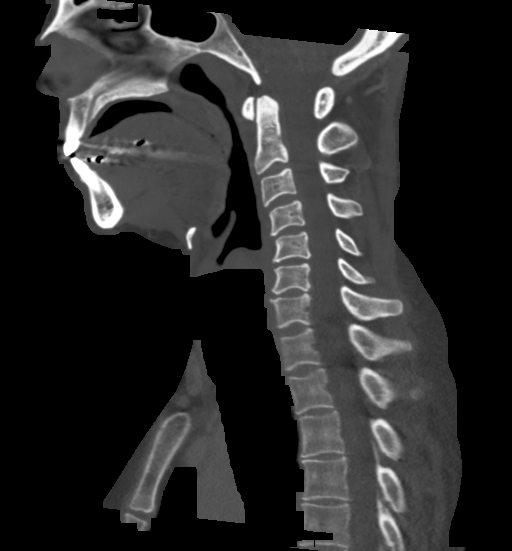
[im 73/126  bone]
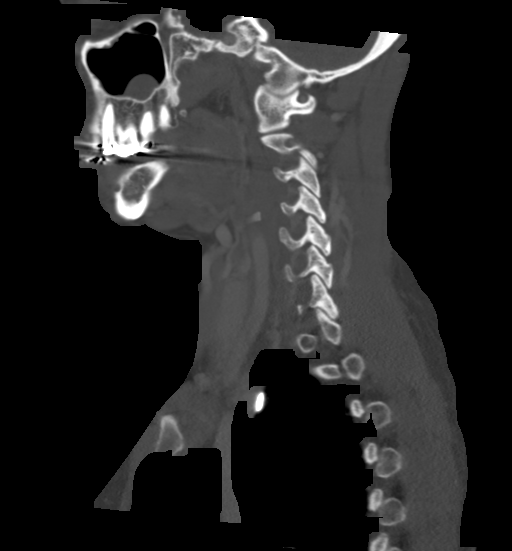
[im 84/126  bone]
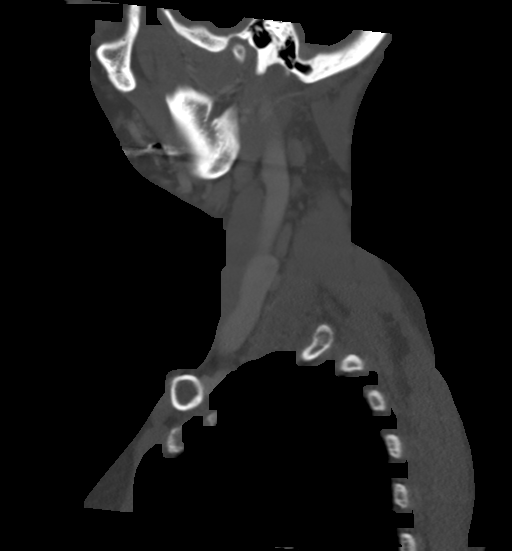

[Series 7: cor neck · coronal · 0.54mm/px · 3 of 101 slices shown]
[im 21/101  bone]
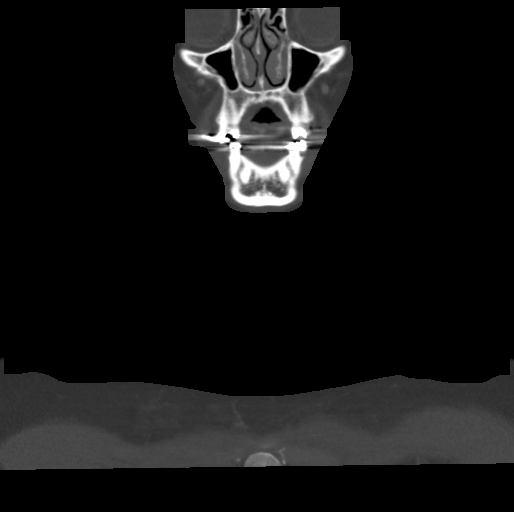
[im 41/101  bone]
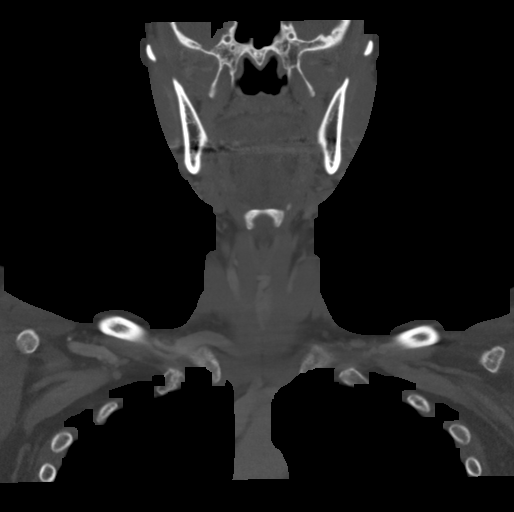
[im 61/101  bone]
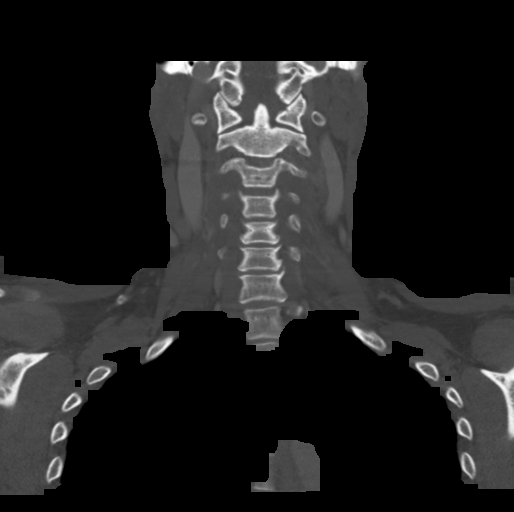

[Series 8: orthogonal ax · axial · 0.42mm/px · z∈[-290,-130]mm · 4 of 135 slices shown, 5 images]
[im 27/135  soft-tissue]
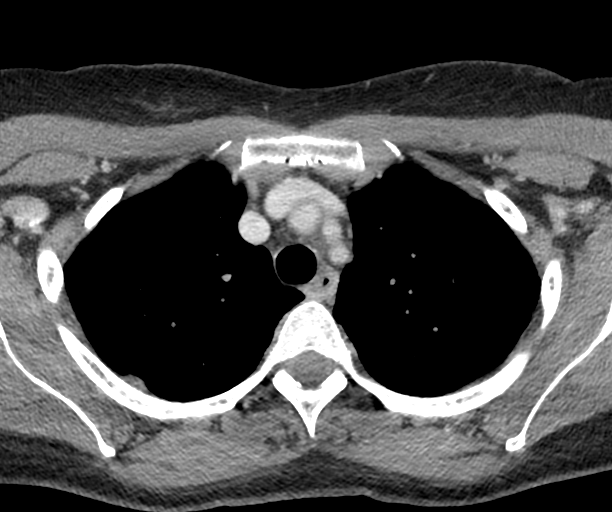
[im 27/135  bone]
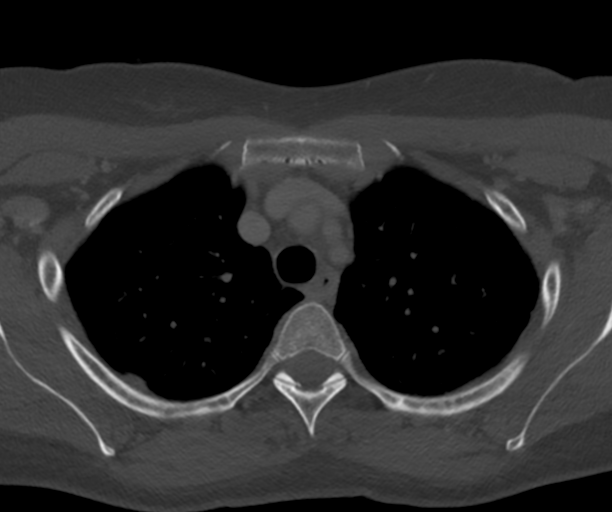
[im 54/135  bone]
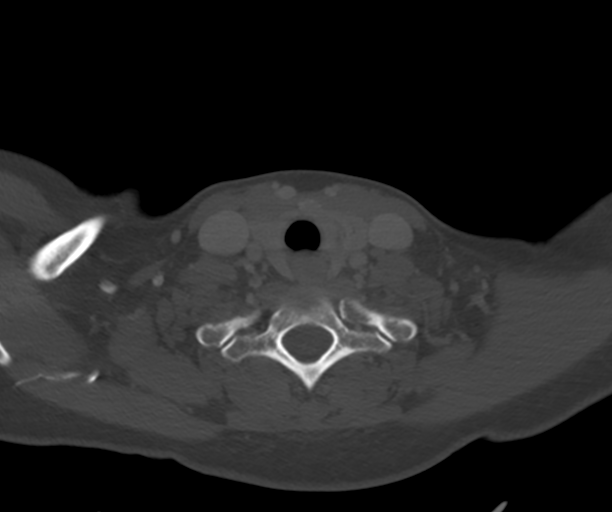
[im 81/135  bone]
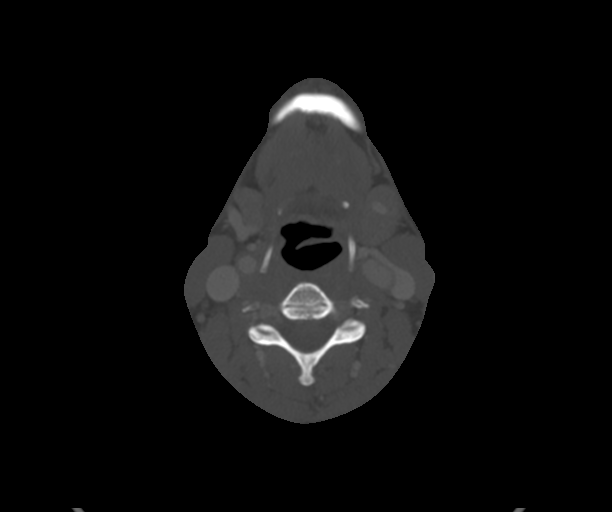
[im 108/135  bone]
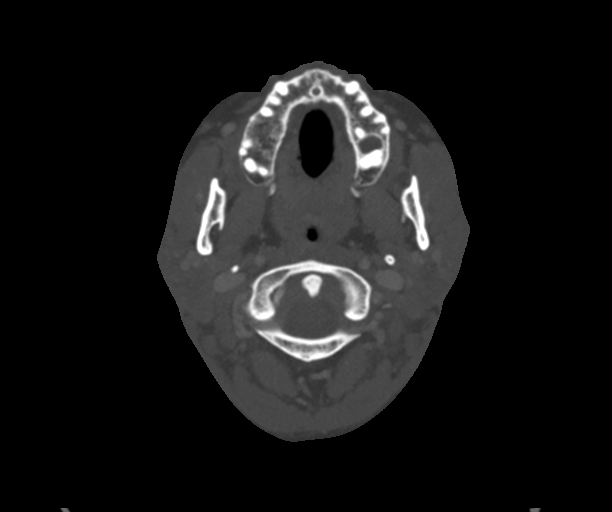

[15 of 33 positions shown; findings below may reference images not displayed]

FINDINGS: Pharynx and larynx: Negative.  No swelling or masslike finding

Salivary glands: No inflammation, mass, or stone. Two or three small
foci of fat density within the left parotid that is considered
incidental.

Thyroid: Left thyroid nodule that is primarily cystic with
septations, up to 3 cm. Negative right lobe.

Lymph nodes: None enlarged or abnormal density.

Vascular: Negative.

Limited intracranial: Negative.

Visualized orbits: Negative

Mastoids and visualized paranasal sinuses: Small retention cysts on
the floors of the maxillary sinuses. No active sinusitis.

Skeleton: Negative

Upper chest: Negative
IMPRESSION: 1. No acute finding.
2. 3 cm left thyroid nodule, recommend outpatient thyroid ultrasound
if not previously obtained.

## 2019-10-11 ENCOUNTER — Other Ambulatory Visit: Payer: Self-pay

## 2019-10-11 ENCOUNTER — Emergency Department
Admission: EM | Admit: 2019-10-11 | Discharge: 2019-10-11 | Disposition: A | Discharge status: Home / Self Care | Payer: Self-pay | Attending: Student in an Organized Health Care Education/Training Program | Admitting: Student in an Organized Health Care Education/Training Program

## 2019-10-11 DIAGNOSIS — F17210 Nicotine dependence, cigarettes, uncomplicated: Secondary | ICD-10-CM | POA: Insufficient documentation

## 2019-10-11 DIAGNOSIS — J45909 Unspecified asthma, uncomplicated: Secondary | ICD-10-CM | POA: Insufficient documentation

## 2019-10-11 DIAGNOSIS — Y999 Unspecified external cause status: Secondary | ICD-10-CM | POA: Insufficient documentation

## 2019-10-11 DIAGNOSIS — F1210 Cannabis abuse, uncomplicated: Secondary | ICD-10-CM | POA: Insufficient documentation

## 2019-10-11 DIAGNOSIS — Y939 Activity, unspecified: Secondary | ICD-10-CM | POA: Insufficient documentation

## 2019-10-11 DIAGNOSIS — I10 Essential (primary) hypertension: Secondary | ICD-10-CM | POA: Insufficient documentation

## 2019-10-11 DIAGNOSIS — S39012A Strain of muscle, fascia and tendon of lower back, initial encounter: Secondary | ICD-10-CM | POA: Insufficient documentation

## 2019-10-11 DIAGNOSIS — Y929 Unspecified place or not applicable: Secondary | ICD-10-CM | POA: Insufficient documentation

## 2019-10-11 DIAGNOSIS — Z79899 Other long term (current) drug therapy: Secondary | ICD-10-CM | POA: Insufficient documentation

## 2019-10-11 DIAGNOSIS — X58XXXA Exposure to other specified factors, initial encounter: Secondary | ICD-10-CM | POA: Insufficient documentation

## 2019-10-11 MED ORDER — TRAMADOL HCL 50 MG PO TABS: 50.0000 mg | ORAL_TABLET | Freq: Four times a day (QID) | ORAL | 0 refills | Status: AC | PRN

## 2019-10-11 MED ORDER — HYDROMORPHONE HCL 1 MG/ML IJ SOLN
1.0000 mg | Freq: Once | INTRAMUSCULAR | Status: AC
  Administered 2019-10-11: 1 mg via INTRAMUSCULAR
  Filled 2019-10-11: qty 1

## 2019-10-11 MED ORDER — CYCLOBENZAPRINE HCL 10 MG PO TABS: 10.0000 mg | ORAL_TABLET | Freq: Three times a day (TID) | ORAL | 0 refills | Status: DC | PRN

## 2019-10-11 MED ORDER — KETOROLAC TROMETHAMINE 10 MG PO TABS
10.0000 mg | ORAL_TABLET | Freq: Four times a day (QID) | ORAL | 0 refills | Status: DC | PRN
Start: 2019-10-11 — End: 2021-09-29

## 2019-10-11 MED ORDER — ORPHENADRINE CITRATE 30 MG/ML IJ SOLN
60.0000 mg | Freq: Two times a day (BID) | INTRAMUSCULAR | Status: DC
  Administered 2019-10-11: 60 mg via INTRAMUSCULAR
  Filled 2019-10-11: qty 2

## 2019-10-11 MED ORDER — KETOROLAC TROMETHAMINE 60 MG/2ML IM SOLN
60.0000 mg | Freq: Once | INTRAMUSCULAR | Status: AC
  Administered 2019-10-11: 60 mg via INTRAMUSCULAR
  Filled 2019-10-11: qty 2

## 2019-10-11 NOTE — ED Provider Notes (Signed)
[facility] Emergency Department Provider Note   ____________________________________________   First MD Initiated Contact with Patient 10/11/19 1145     (approximate)  I have reviewed the triage vital signs and the nursing notes.   HISTORY  Chief Complaint Back Pain    HPI Patricia [name] is a 54 y.o. female patient complain increasing back pain for 4 days.  Patient relays a history of strenuous activities 4 days ago.  Patient denies radicular component to her back pain.  Patient denies bladder bowel dysfunction.  Patient says she has issues of L5 lumbar process.  Patient rates the pain as 10/10.  Patient described pain as "sharp".  No palliative measure for complaint.         Past Medical History:  Diagnosis Date  . Asthma   . Bronchitis   . Hypertension     There are no problems to display for this patient.   Past Surgical History:  Procedure Laterality Date  . CHOLECYSTECTOMY    . TUBAL LIGATION      Prior to Admission medications   Medication Sig Start Date End Date Taking? Authorizing Provider  albuterol (PROVENTIL HFA;VENTOLIN HFA) 108 (90 Base) MCG/ACT inhaler Inhale 2 puffs into the lungs every 6 (six) hours as needed for wheezing or shortness of breath. 07/08/16   Sharyn Creamer, MD  cyclobenzaprine (FLEXERIL) 10 MG tablet Take 1 tablet (10 mg total) by mouth 3 (three) times daily as needed. 10/11/19   Joni Reining, PA-C  hydrochlorothiazide (MICROZIDE) 12.5 MG capsule Take 1 capsule (12.5 mg total) by mouth daily. 07/22/16   Willy Eddy, MD  ketorolac (TORADOL) 10 MG tablet Take 1 tablet (10 mg total) by mouth every 6 (six) hours as needed. 10/11/19   Joni Reining, PA-C  traMADol (ULTRAM) 50 MG tablet Take 1 tablet (50 mg total) by mouth every 6 (six) hours as needed. 10/11/19 10/10/20  Joni Reining, PA-C    Allergies Patient has no known allergies.  No family history on file.  Social History Social History   Tobacco  Use  . Smoking status: Current Every Day Smoker    Types: Cigarettes  . Smokeless tobacco: Never Used  Substance Use Topics  . Alcohol use: Yes    Comment: occassionaly  . Drug use: Yes    Types: Marijuana    Review of Systems Constitutional: No fever/chills Eyes: No visual changes. ENT: No sore throat. Cardiovascular: Denies chest pain. Respiratory: Denies shortness of breath. Gastrointestinal: No abdominal pain.  No nausea, no vomiting.  No diarrhea.  No constipation. Genitourinary: Negative for dysuria. Musculoskeletal: Negative for back pain. Skin: Negative for rash. Neurological: Negative for headaches, focal weakness or numbness. Endocrine:  Hypertension. ____________________________________________   PHYSICAL EXAM:  VITAL SIGNS: ED Triage Vitals  Enc Vitals Group     BP 10/11/19 1132 (!) 152/95     Pulse Rate 10/11/19 1132 91     Resp 10/11/19 1132 18     Temp 10/11/19 1132 99 F (37.2 C)     Temp Source 10/11/19 1132 Oral     SpO2 10/11/19 1132 100 %     Weight 10/11/19 1133 170 lb (77.1 kg)     Height 10/11/19 1133 5\' 5"  (1.651 m)     Head Circumference --      Peak Flow --      Pain Score 10/11/19 1133 10     Pain Loc --      Pain Edu? --  Excl. in GC? --     Constitutional: Alert and oriented.  Moderate distress.   Cardiovascular: Normal rate, regular rhythm. Grossly normal heart sounds.  Good peripheral circulation. Respiratory: Normal respiratory effort.  No retractions. Lungs CTAB. Genitourinary: Deferred Musculoskeletal: Patient sits and stands with heavy reliance on upper extremity.  No obvious spinal deformity.  Patient is moderate guarding palpation of L4-S1.  Patient decreased range of motion is all fields.  Patient negative straight leg test in supine position. Neurologic:  Normal speech and language. No gross focal neurologic deficits are appreciated. No gait instability. Skin:  Skin is warm, dry and intact. No rash noted. Psychiatric:  Mood and affect are normal. Speech and behavior are normal.  ____________________________________________   LABS (all labs ordered are listed, but only abnormal results are displayed)  Labs Reviewed - No data to display ____________________________________________  EKG   ____________________________________________  RADIOLOGY  ED MD interpretation:    Official radiology report(s): No results found.  ____________________________________________   PROCEDURES  Procedure(s) performed (including Critical Care):  Procedures   ____________________________________________   INITIAL IMPRESSION / ASSESSMENT AND PLAN / ED COURSE  As part of my medical decision making, I reviewed the following data within the electronic MEDICAL RECORD NUMBER     Patient presents with low back pain for 4 days.  Patient complaint physical exam consistent with lumbar strain.  Patient given discharge care instruction work note.  Patient advised take medication as directed.    Patricia [name] was evaluated in Emergency Department on 10/11/2019 for the symptoms described in the history of present illness. She was evaluated in the context of the global COVID-19 pandemic, which necessitated consideration that the patient might be at risk for infection with the SARS-CoV-2 virus that causes COVID-19. Institutional protocols and algorithms that pertain to the evaluation of patients at risk for COVID-19 are in a state of rapid change based on information released by regulatory bodies including the CDC and federal and state organizations. These policies and algorithms were followed during the patient's care in the ED.       ____________________________________________   FINAL CLINICAL IMPRESSION(S) / ED DIAGNOSES  Final diagnoses:  Strain of lumbar region, initial encounter     ED Discharge Orders         Ordered    cyclobenzaprine (FLEXERIL) 10 MG tablet  3 times daily PRN     Discontinue  Reprint      10/11/19 1215    traMADol (ULTRAM) 50 MG tablet  Every 6 hours PRN     Discontinue  Reprint     10/11/19 1215    ketorolac (TORADOL) 10 MG tablet  Every 6 hours PRN     Discontinue  Reprint     10/11/19 1215           Note:  This document was prepared using Dragon voice recognition software and may include unintentional dictation errors.    Joni Reining, PA-C 10/11/19 1215    Willy Eddy, MD 10/11/19 1346

## 2019-10-11 NOTE — ED Triage Notes (Signed)
Pt states that she has a known issue with L5 andL6, states did strenuous work on Monday and has been hurting ever since

## 2019-10-11 NOTE — Discharge Instructions (Signed)
Follow discharge care instruction take medication as directed. °

## 2019-10-30 ENCOUNTER — Emergency Department
Admission: EM | Admit: 2019-10-30 | Discharge: 2019-10-30 | Disposition: A | Discharge status: Home / Self Care | Payer: Self-pay | Attending: Emergency Medicine | Admitting: Emergency Medicine

## 2019-10-30 ENCOUNTER — Ambulatory Visit: Payer: Self-pay

## 2019-10-30 ENCOUNTER — Emergency Department: Payer: Self-pay

## 2019-10-30 ENCOUNTER — Other Ambulatory Visit: Payer: Self-pay

## 2019-10-30 DIAGNOSIS — R599 Enlarged lymph nodes, unspecified: Secondary | ICD-10-CM | POA: Insufficient documentation

## 2019-10-30 DIAGNOSIS — I10 Essential (primary) hypertension: Secondary | ICD-10-CM | POA: Insufficient documentation

## 2019-10-30 DIAGNOSIS — Z79899 Other long term (current) drug therapy: Secondary | ICD-10-CM | POA: Insufficient documentation

## 2019-10-30 DIAGNOSIS — J45909 Unspecified asthma, uncomplicated: Secondary | ICD-10-CM | POA: Insufficient documentation

## 2019-10-30 DIAGNOSIS — R591 Generalized enlarged lymph nodes: Secondary | ICD-10-CM

## 2019-10-30 DIAGNOSIS — R0789 Other chest pain: Secondary | ICD-10-CM | POA: Insufficient documentation

## 2019-10-30 DIAGNOSIS — F17210 Nicotine dependence, cigarettes, uncomplicated: Secondary | ICD-10-CM | POA: Insufficient documentation

## 2019-10-30 LAB — BASIC METABOLIC PANEL
Anion gap: 8 (ref 5–15)
BUN: 8 mg/dL (ref 6–20)
CO2: 26 mmol/L (ref 22–32)
Calcium: 8.7 mg/dL — ABNORMAL LOW (ref 8.9–10.3)
Chloride: 103 mmol/L (ref 98–111)
Creatinine, Ser: 0.65 mg/dL (ref 0.44–1.00)
GFR calc Af Amer: >60 mL/min (ref >60)
GFR calc non Af Amer: >60 mL/min (ref >60)
Glucose, Bld: 112 mg/dL — ABNORMAL HIGH (ref 70–99)
Potassium: 3.3 mmol/L — ABNORMAL LOW (ref 3.5–5.1)
Sodium: 137 mmol/L (ref 135–145)

## 2019-10-30 LAB — CBC
HCT: 38.1 % (ref 36.0–46.0)
Hemoglobin: 13.4 g/dL (ref 12.0–15.0)
MCH: 34.3 pg — ABNORMAL HIGH (ref 26.0–34.0)
MCHC: 35.2 g/dL (ref 30.0–36.0)
MCV: 97.4 fL (ref 80.0–100.0)
Platelets: 197 10*3/uL (ref 150–400)
RBC: 3.91 MIL/uL (ref 3.87–5.11)
RDW: 11.5 % (ref 11.5–15.5)
WBC: 5.7 10*3/uL (ref 4.0–10.5)
nRBC: 0 % (ref 0.0–0.2)

## 2019-10-30 LAB — TROPONIN I (HIGH SENSITIVITY): Troponin I (High Sensitivity): 4 ng/L (ref <18)

## 2019-10-30 MED ORDER — IBUPROFEN 600 MG PO TABS
600.0000 mg | ORAL_TABLET | Freq: Once | ORAL | Status: AC
  Administered 2019-10-30: 600 mg via ORAL
  Filled 2019-10-30: qty 1

## 2019-10-30 NOTE — ED Provider Notes (Signed)
[facility] Emergency Department Provider Note   ____________________________________________    I have reviewed the triage vital signs and the nursing notes.   HISTORY  Chief Complaint Chest Pain     HPI Patricia [name] is a 54 y.o. female with history of asthma discomfort in her axilla bilaterally as well as some chest discomfort.  Patient is concerned because she received her second dose Covid vaccine 2 days ago.  She felt fatigued yesterday and developed discomfort in her axilla overnight.  Denies shortness of breath.  No cough.  No fevers or chills.  Is not take anything for this.  No sick contacts.  Past Medical History:  Diagnosis Date  . Asthma   . Bronchitis   . Hypertension     There are no problems to display for this patient.   Past Surgical History:  Procedure Laterality Date  . CHOLECYSTECTOMY    . TUBAL LIGATION      Prior to Admission medications   Medication Sig Start Date End Date Taking? Authorizing Provider  albuterol (PROVENTIL HFA;VENTOLIN HFA) 108 (90 Base) MCG/ACT inhaler Inhale 2 puffs into the lungs every 6 (six) hours as needed for wheezing or shortness of breath. 07/08/16   Sharyn Creamer, MD  cyclobenzaprine (FLEXERIL) 10 MG tablet Take 1 tablet (10 mg total) by mouth 3 (three) times daily as needed. 10/11/19   Joni Reining, PA-C  hydrochlorothiazide (MICROZIDE) 12.5 MG capsule Take 1 capsule (12.5 mg total) by mouth daily. 07/22/16   Willy Eddy, MD  ketorolac (TORADOL) 10 MG tablet Take 1 tablet (10 mg total) by mouth every 6 (six) hours as needed. 10/11/19   Joni Reining, PA-C  traMADol (ULTRAM) 50 MG tablet Take 1 tablet (50 mg total) by mouth every 6 (six) hours as needed. 10/11/19 10/10/20  Joni Reining, PA-C     Allergies Patient has no known allergies.  History reviewed. No pertinent family history.  Social History Social History   Tobacco Use  . Smoking status: Current Every Day Smoker     Types: Cigarettes  . Smokeless tobacco: Never Used  Substance Use Topics  . Alcohol use: Yes    Comment: occassionaly  . Drug use: Yes    Types: Marijuana    Review of Systems  Constitutional: No fever/chills Eyes: No visual changes.  ENT: No sore throat. Cardiovascular: As above Respiratory: Denies shortness of breath. Gastrointestinal: No abdominal pain.  No nausea, no vomiting.   Genitourinary: Negative for dysuria. Musculoskeletal: Negative for back pain. Skin: Negative for rash. Neurological: Negative for headaches    ____________________________________________   PHYSICAL EXAM:  VITAL SIGNS: ED Triage Vitals [10/30/19 1509]  Enc Vitals Group     BP (!) 165/114     Pulse Rate 97     Resp 18     Temp 98.3 F (36.8 C)     Temp Source Oral     SpO2 99 %     Weight 77 kg (169 lb 12.1 oz)     Height 1.651 m (5\' 5" )     Head Circumference      Peak Flow      Pain Score 8     Pain Loc      Pain Edu?      Excl. in GC?     Constitutional: Alert and oriented. No acute distress.  Nose: No congestion/rhinnorhea. Mouth/Throat: Mucous membranes are moist.    Cardiovascular: Normal rate, regular rhythm. Grossly normal heart  sounds.  Good peripheral circulation.  Tenderness palpation along the sternal border bilaterally Respiratory: Normal respiratory effort.  No retractions. Lungs CTAB. Gastrointestinal: Soft and nontender. No distention.  No CVA tenderness. Genitourinary: deferred Musculoskeletal: .  Warm and well perfused Neurologic:  Normal speech and language. No gross focal neurologic deficits are appreciated.  Skin:  Skin is warm, dry and intact. No rash noted. Psychiatric: Mood and affect are normal. Speech and behavior are normal.  ____________________________________________   LABS (all labs ordered are listed, but only abnormal results are displayed)  Labs Reviewed  BASIC METABOLIC PANEL - Abnormal; Notable for the following components:      Result  Value   Potassium 3.3 (*)    Glucose, Bld 112 (*)    Calcium 8.7 (*)    All other components within normal limits  CBC - Abnormal; Notable for the following components:   MCH 34.3 (*)    All other components within normal limits  POC URINE PREG, ED  TROPONIN I (HIGH SENSITIVITY)   ____________________________________________  EKG  ED ECG REPORT I, Jene Every, the attending physician, personally viewed and interpreted this ECG.  Date: 10/30/2019  Rhythm: normal sinus rhythm QRS Axis: normal Intervals: normal ST/T Wave abnormalities: normal Narrative Interpretation: no evidence of acute ischemia  ____________________________________________  RADIOLOGY  Chest x-ray reviewed by me, no infiltrate effusion or pneumothorax ____________________________________________   PROCEDURES  Procedure(s) performed: No  Procedures   Critical Care performed: No ____________________________________________   INITIAL IMPRESSION / ASSESSMENT AND PLAN / ED COURSE  Pertinent labs & imaging results that were available during my care of the patient were reviewed by me and considered in my medical decision making (see chart for details).  Patient presents with tenderness in the bilateral axilla with some chest discomfort since taking vaccine second dose several days ago.  Strongly suspicious for lymphadenopathy which is frequent after the vaccine.  Chest chest wall tenderness to palpation which is unusual, not consistent with myocarditis or pericarditis and her troponin is reassuring as is her EKG.  Lab work is otherwise unremarkable.  I suspect she has a general inflammatory reaction which we will manage with NSAIDs for now, outpatient follow-up recommended    ____________________________________________   FINAL CLINICAL IMPRESSION(S) / ED DIAGNOSES  Final diagnoses:  Acute chest wall pain  Lymphadenopathy        Note:  This document was prepared using Dragon voice  recognition software and may include unintentional dictation errors.   Jene Every, MD 10/30/19 2045

## 2019-10-30 NOTE — ED Triage Notes (Signed)
Pt comes POV with chest pain starting this morning after second covid shot Monday. Generalized pain across chest described as aching and tight.

## 2019-10-30 NOTE — Discharge Instructions (Addendum)
The vaccine appears to have generated a strong immune response which is why you have pain in your armpits, this is almost certainly due to swollen lymph nodes.  I suspect your tenderness along her chest will resolve over the next 1 to 2 days, please alternate Motrin and Tylenol as needed, as we discussed

## 2019-10-30 NOTE — Telephone Encounter (Addendum)
Pt. Reports she had her second COVID 19 vaccine Monday. This morning she started having chest pain "allover my chest and it hurts when I take a deep breath." Pain is 10/10.States she was able to take a nap this morning. No other symptoms. Instructed to go to ED now. Instructed to call 911 if symptoms worsen. Reason for Disposition . [1] Chest pain (or "angina") comes and goes AND [2] is happening more often (increasing in frequency) or getting worse (increasing in severity) (Exception: chest pains that last only a few seconds)  Answer Assessment - Initial Assessment Questions 1. LOCATION: "Where does it hurt?"       All over chest 2. RADIATION: "Does the pain go anywhere else?" (e.g., into neck, jaw, arms, back)     No 3. ONSET: "When did the chest pain begin?" (Minutes, hours or days)      This morning 4. PATTERN "Does the pain come and go, or has it been constant since it started?"  "Does it get worse with exertion?"      Constant 5. DURATION: "How long does it last" (e.g., seconds, minutes, hours)     Hours 6. SEVERITY: "How bad is the pain?"  (e.g., Scale 1-10; mild, moderate, or severe)    - MILD (1-3): doesn't interfere with normal activities     - MODERATE (4-7): interferes with normal activities or awakens from sleep    - SEVERE (8-10): excruciating pain, unable to do any normal activities        10 7. CARDIAC RISK FACTORS: "Do you have any history of heart problems or risk factors for heart disease?" (e.g., angina, prior heart attack; diabetes, high blood pressure, high cholesterol, smoker, or strong family history of heart disease)     No 8. PULMONARY RISK FACTORS: "Do you have any history of lung disease?"  (e.g., blood clots in lung, asthma, emphysema, birth control pills)     Asthma 9. CAUSE: "What do you think is causing the chest pain?"     Unsure 10. OTHER SYMPTOMS: "Do you have any other symptoms?" (e.g., dizziness, nausea, vomiting, sweating, fever, difficulty breathing,  cough)       No 11. PREGNANCY: "Is there any chance you are pregnant?" "When was your last menstrual period?"       No  Protocols used: CHEST PAIN-A-AH

## 2019-10-30 NOTE — Telephone Encounter (Signed)
Patient called to report chest pain after receiving COVID vaccine, the call was dropped before patient was transferred to NT. I called the patient back, left VM to return the call to speak to a nurse.

## 2019-11-08 ENCOUNTER — Emergency Department
Admission: EM | Admit: 2019-11-08 | Discharge: 2019-11-08 | Disposition: A | Discharge status: Home / Self Care | Payer: Self-pay | Attending: Emergency Medicine | Admitting: Emergency Medicine

## 2019-11-08 ENCOUNTER — Other Ambulatory Visit: Payer: Self-pay

## 2019-11-08 ENCOUNTER — Emergency Department: Payer: Self-pay

## 2019-11-08 DIAGNOSIS — M7918 Myalgia, other site: Secondary | ICD-10-CM | POA: Insufficient documentation

## 2019-11-08 DIAGNOSIS — Z79899 Other long term (current) drug therapy: Secondary | ICD-10-CM | POA: Insufficient documentation

## 2019-11-08 DIAGNOSIS — J45909 Unspecified asthma, uncomplicated: Secondary | ICD-10-CM | POA: Insufficient documentation

## 2019-11-08 DIAGNOSIS — F17210 Nicotine dependence, cigarettes, uncomplicated: Secondary | ICD-10-CM | POA: Insufficient documentation

## 2019-11-08 DIAGNOSIS — M25561 Pain in right knee: Secondary | ICD-10-CM | POA: Insufficient documentation

## 2019-11-08 DIAGNOSIS — I10 Essential (primary) hypertension: Secondary | ICD-10-CM | POA: Insufficient documentation

## 2019-11-08 MED ORDER — NAPROXEN 500 MG PO TABS
500.0000 mg | ORAL_TABLET | Freq: Once | ORAL | Status: AC
  Administered 2019-11-08: 500 mg via ORAL
  Filled 2019-11-08: qty 1

## 2019-11-08 NOTE — ED Triage Notes (Signed)
First nurse note- pt twisted knee getting out of car.

## 2019-11-08 NOTE — ED Triage Notes (Signed)
Pt comes via POV from home with c/o right knee pain. Pt states she twisted it getting out of the car. Pt states 7/10 pain.

## 2019-11-08 NOTE — ED Provider Notes (Signed)
[facility] Emergency Department Provider Note  ____________________________________________   First MD Initiated Contact with Patient 11/08/19 [phone]     (approximate)  I have reviewed the triage vital signs and the nursing notes.   HISTORY  Chief Complaint Knee Pain   HPI Patricia [name] is a 54 y.o. female with past medical history of asthma and hypertension who presents for assessment of right knee pain that began yesterday when she twisted it while stepping out of a car.  Patient states the pain is in the front of her knee and rating up her leg.  She denies any other acute pain including her right hip or ankle.  Denies any other recent injuries and has otherwise been in her usual state health without any recent fevers, vomiting, diarrhea, dysuria, chest pain, or other acute sick symptoms.  No prior similar episodes or injuries to the right knee.  Patient took some Tylenol but this is not significantly helped.  No other clear alleviating or aggravating factors aside from bearing weight which aggravates the pain.         Past Medical History:  Diagnosis Date  . Asthma   . Bronchitis   . Hypertension     There are no problems to display for this patient.   Past Surgical History:  Procedure Laterality Date  . CHOLECYSTECTOMY    . TUBAL LIGATION      Prior to Admission medications   Medication Sig Start Date End Date Taking? Authorizing Provider  albuterol (PROVENTIL HFA;VENTOLIN HFA) 108 (90 Base) MCG/ACT inhaler Inhale 2 puffs into the lungs every 6 (six) hours as needed for wheezing or shortness of breath. 07/08/16   Sharyn Creamer, MD  cyclobenzaprine (FLEXERIL) 10 MG tablet Take 1 tablet (10 mg total) by mouth 3 (three) times daily as needed. 10/11/19   Joni Reining, PA-C  hydrochlorothiazide (MICROZIDE) 12.5 MG capsule Take 1 capsule (12.5 mg total) by mouth daily. 07/22/16   Willy Eddy, MD  ketorolac (TORADOL) 10 MG tablet Take 1 tablet  (10 mg total) by mouth every 6 (six) hours as needed. 10/11/19   Joni Reining, PA-C  traMADol (ULTRAM) 50 MG tablet Take 1 tablet (50 mg total) by mouth every 6 (six) hours as needed. 10/11/19 10/10/20  Joni Reining, PA-C    Allergies Patient has no known allergies.  No family history on file.  Social History Social History   Tobacco Use  . Smoking status: Current Every Day Smoker    Types: Cigarettes  . Smokeless tobacco: Never Used  Substance Use Topics  . Alcohol use: Yes    Comment: occassionaly  . Drug use: Yes    Types: Marijuana    Review of Systems  Review of Systems  Constitutional: Negative for chills and fever.  HENT: Negative for sore throat.   Eyes: Negative for pain.  Respiratory: Negative for cough and stridor.   Cardiovascular: Negative for chest pain.  Gastrointestinal: Negative for vomiting.  Musculoskeletal: Positive for joint pain ( R knee) and myalgias ( R knee).  Skin: Negative for rash.  Neurological: Negative for seizures, loss of consciousness and headaches.  Psychiatric/Behavioral: Negative for suicidal ideas.  All other systems reviewed and are negative.     ____________________________________________   PHYSICAL EXAM:  VITAL SIGNS: ED Triage Vitals  Enc Vitals Group     BP 11/08/19 0834 (!) 156/101     Pulse Rate 11/08/19 0834 93     Resp 11/08/19 0834 18  Temp 11/08/19 0834 98 F (36.7 C)     Temp src --      SpO2 11/08/19 0834 99 %     Weight 11/08/19 0833 169 lb 12.1 oz (77 kg)     Height 11/08/19 0833 5\' 5"  (1.651 m)     Head Circumference --      Peak Flow --      Pain Score 11/08/19 0833 7     Pain Loc --      Pain Edu? --      Excl. in GC? --    Vitals:   11/08/19 0834  BP: (!) 156/101  Pulse: 93  Resp: 18  Temp: 98 F (36.7 C)  SpO2: 99%   Physical Exam Vitals and nursing note reviewed.  Constitutional:      General: She is not in acute distress.    Appearance: She is well-developed.  HENT:      Head: Normocephalic and atraumatic.     Right Ear: External ear normal.     Left Ear: External ear normal.     Nose: Nose normal.  Eyes:     Conjunctiva/sclera: Conjunctivae normal.  Cardiovascular:     Rate and Rhythm: Normal rate and regular rhythm.     Pulses: Normal pulses.     Heart sounds: No murmur heard.   Pulmonary:     Effort: Pulmonary effort is normal. No respiratory distress.  Abdominal:     Palpations: Abdomen is soft.  Musculoskeletal:     Cervical back: Neck supple.  Skin:    General: Skin is warm and dry.     Capillary Refill: Capillary refill takes less than 2 seconds.  Neurological:     Mental Status: She is alert and oriented to person, place, and time.  Psychiatric:        Mood and Affect: Mood normal.     Patient has decreased strength on flexion extension of the right knee full strength of the right hip and ankle.  2+ DP pulse.  Sensation intact throughout the right lower extremity to light touch.  There is no significant effusion of the right knee or significant laxity on anterior posterior or medial or lateral stress testing. ____________________________________________  _______________________________________  RADIOLOGY  Official radiology report(s): DG Knee Complete 4 Views Right  Result Date: 11/08/2019 CLINICAL DATA:  Pain following twisting injury EXAM: RIGHT KNEE - COMPLETE 4+ VIEW COMPARISON:  None. FINDINGS: Frontal, lateral, and bilateral oblique views were obtained. No fracture or dislocation. No joint effusion. Joint spaces appear normal. No erosive change. IMPRESSION: No fracture, dislocation, or joint effusion. No evident arthropathy. Electronically Signed   By: 01/08/2020 III M.D.   On: 11/08/2019 08:59    ____________________________________________   PROCEDURES  Procedure(s) performed (including Critical Care):  Procedures   ____________________________________________   INITIAL IMPRESSION / ASSESSMENT AND PLAN / ED  COURSE        Patient presents with 01/08/2020 to history exam for assessment of acute right knee pain that began after he stepped out of car as described above.  Patient is afebrile hemodynamically stable on arrival.  Exam as above while any evidence of distal neurovascular deficit.  The knee itself does not appear dislocated and there is no significant effusion.  X-ray shows no evidence of dislocation or fracture.  Concern for possible meniscus versus other soft tissue injury.  Patient placed in knee immobilizer and given crutches as well as below noted analgesia.  Instructed to follow-up with orthopedic service  that she is not feeling better in 5 to 7 days.  Discharged stable condition.  Strict return precautions advised and discussed.          ____________________________________________   FINAL CLINICAL IMPRESSION(S) / ED DIAGNOSES  Final diagnoses:  Acute pain of right knee    Medications  naproxen (NAPROSYN) tablet 500 mg (has no administration in time range)     ED Discharge Orders    None       Note:  This document was prepared using Dragon voice recognition software and may include unintentional dictation errors.   Gilles Chiquito, MD 11/08/19 [phone]

## 2020-10-14 ENCOUNTER — Emergency Department: Payer: Self-pay

## 2020-10-14 ENCOUNTER — Encounter: Payer: Self-pay | Admitting: Emergency Medicine

## 2020-10-14 ENCOUNTER — Other Ambulatory Visit: Payer: Self-pay

## 2020-10-14 ENCOUNTER — Emergency Department
Admission: EM | Admit: 2020-10-14 | Discharge: 2020-10-14 | Disposition: A | Discharge status: Home / Self Care | Payer: Self-pay | Attending: Emergency Medicine | Admitting: Emergency Medicine

## 2020-10-14 DIAGNOSIS — R531 Weakness: Secondary | ICD-10-CM | POA: Insufficient documentation

## 2020-10-14 DIAGNOSIS — J45909 Unspecified asthma, uncomplicated: Secondary | ICD-10-CM | POA: Insufficient documentation

## 2020-10-14 DIAGNOSIS — Z79899 Other long term (current) drug therapy: Secondary | ICD-10-CM | POA: Insufficient documentation

## 2020-10-14 DIAGNOSIS — F17210 Nicotine dependence, cigarettes, uncomplicated: Secondary | ICD-10-CM | POA: Insufficient documentation

## 2020-10-14 DIAGNOSIS — I10 Essential (primary) hypertension: Secondary | ICD-10-CM | POA: Insufficient documentation

## 2020-10-14 DIAGNOSIS — M79605 Pain in left leg: Secondary | ICD-10-CM | POA: Insufficient documentation

## 2020-10-14 LAB — COMPREHENSIVE METABOLIC PANEL
ALT: 8 U/L (ref 0–44)
AST: 17 U/L (ref 15–41)
Albumin: 4.8 g/dL (ref 3.5–5.0)
Alkaline Phosphatase: 92 U/L (ref 38–126)
Anion gap: 7 (ref 5–15)
BUN: 9 mg/dL (ref 6–20)
CO2: 25 mmol/L (ref 22–32)
Calcium: 9.2 mg/dL (ref 8.9–10.3)
Chloride: 105 mmol/L (ref 98–111)
Creatinine, Ser: 0.81 mg/dL (ref 0.44–1.00)
GFR, Estimated: >60 mL/min (ref >60)
Glucose, Bld: 102 mg/dL — ABNORMAL HIGH (ref 70–99)
Potassium: 3.8 mmol/L (ref 3.5–5.1)
Sodium: 137 mmol/L (ref 135–145)
Total Bilirubin: 0.6 mg/dL (ref 0.3–1.2)
Total Protein: 7.9 g/dL (ref 6.5–8.1)

## 2020-10-14 LAB — CBC WITH DIFFERENTIAL/PLATELET
Abs Immature Granulocytes: 0.02 10*3/uL (ref 0.00–0.07)
Basophils Absolute: 0 10*3/uL (ref 0.0–0.1)
Basophils Relative: 0 %
Eosinophils Absolute: 0.1 10*3/uL (ref 0.0–0.5)
Eosinophils Relative: 1 %
HCT: 43.1 % (ref 36.0–46.0)
Hemoglobin: 15.5 g/dL — ABNORMAL HIGH (ref 12.0–15.0)
Immature Granulocytes: 0 %
Lymphocytes Relative: 40 %
Lymphs Abs: 3.4 10*3/uL (ref 0.7–4.0)
MCH: 35.4 pg — ABNORMAL HIGH (ref 26.0–34.0)
MCHC: 36 g/dL (ref 30.0–36.0)
MCV: 98.4 fL (ref 80.0–100.0)
Monocytes Absolute: 0.5 10*3/uL (ref 0.1–1.0)
Monocytes Relative: 6 %
Neutro Abs: 4.5 10*3/uL (ref 1.7–7.7)
Neutrophils Relative %: 53 %
Platelets: 263 10*3/uL (ref 150–400)
RBC: 4.38 MIL/uL (ref 3.87–5.11)
RDW: 11.8 % (ref 11.5–15.5)
WBC: 8.4 10*3/uL (ref 4.0–10.5)
nRBC: 0 % (ref 0.0–0.2)

## 2020-10-14 LAB — CK: Total CK: 110 U/L (ref 38–234)

## 2020-10-14 MED ORDER — PREDNISONE 10 MG (21) PO TBPK: ORAL_TABLET | ORAL | 0 refills | Status: DC

## 2020-10-14 NOTE — ED Provider Notes (Signed)
[facility]-EMERGENCY DEPARTMENT  ____________________________________________  Time seen: Approximately 6:53 PM  I have reviewed the triage vital signs and the nursing notes.   HISTORY  Chief Complaint Leg Pain   Historian Patient    HPI Patricia [name] is a 55 y.o. female presents to the emergency department with complaints of weakness.  Patient states that she has intense pain in her left leg along the lateral aspect of the leg and states that she feels off balance.  Patient is tearful and states that she has never had sensation of lower extremity weakness in the past.  She denies bowel or bladder incontinence or saddle anesthesia.  Patient states that she initially thought that she needed to rest and was hopeful that her symptoms resolved over the weekend but have not.  Patient states that she has had a 30 pound weight loss over the past year and was diagnosed with hyperthyroidism.  She states that she has not been taking any medications for her hyperthyroidism recently.    Past Medical History:  Diagnosis Date   Asthma    Bronchitis    Hypertension      Immunizations up to date:  Yes.     Past Medical History:  Diagnosis Date   Asthma    Bronchitis    Hypertension     There are no problems to display for this patient.   Past Surgical History:  Procedure Laterality Date   CHOLECYSTECTOMY     TUBAL LIGATION      Prior to Admission medications   Medication Sig Start Date End Date Taking? Authorizing Provider  predniSONE (STERAPRED UNI-PAK 21 TAB) 10 MG (21) TBPK tablet Take 6 tablets the first day, take 5 tablets the second day, take 4 tablets the third day, take 3 tablets the fourth day, take 2 tablets the fifth day, take 1 tablet the sixth day. 10/14/20  Yes Pia Mau M, PA-C  albuterol (PROVENTIL HFA;VENTOLIN HFA) 108 (90 Base) MCG/ACT inhaler Inhale 2 puffs into the lungs every 6 (six) hours as needed for wheezing or shortness of breath. 07/08/16   Sharyn Creamer,  MD  cyclobenzaprine (FLEXERIL) 10 MG tablet Take 1 tablet (10 mg total) by mouth 3 (three) times daily as needed. 10/11/19   Joni Reining, PA-C  hydrochlorothiazide (MICROZIDE) 12.5 MG capsule Take 1 capsule (12.5 mg total) by mouth daily. 07/22/16   Willy Eddy, MD  ketorolac (TORADOL) 10 MG tablet Take 1 tablet (10 mg total) by mouth every 6 (six) hours as needed. 10/11/19   Joni Reining, PA-C    Allergies Patient has no known allergies.  History reviewed. No pertinent family history.  Social History Social History   Tobacco Use   Smoking status: Every Day    Types: Cigarettes   Smokeless tobacco: Never  Substance Use Topics   Alcohol use: Yes    Comment: occassionaly   Drug use: Yes    Types: Marijuana     Review of Systems  Constitutional: No fever/chills Eyes:  No discharge ENT: No upper respiratory complaints. Respiratory: no cough. No SOB/ use of accessory muscles to breath Gastrointestinal:   No nausea, no vomiting.  No diarrhea.  No constipation. Musculoskeletal: Patient has leg pain and weakness.  Skin: Negative for rash, abrasions, lacerations, ecchymosis.    ____________________________________________   PHYSICAL EXAM:  VITAL SIGNS: ED Triage Vitals  Enc Vitals Group     BP 10/14/20 1558 (!) 161/110     Pulse Rate 10/14/20 1558 87  Resp 10/14/20 1558 18     Temp 10/14/20 1558 98.4 F (36.9 C)     Temp Source 10/14/20 1558 Oral     SpO2 10/14/20 1558 98 %     Weight 10/14/20 1558 169 lb (76.7 kg)     Height 10/14/20 1558 5\' 5"  (1.651 m)     Head Circumference --      Peak Flow --      Pain Score 10/14/20 1610 10     Pain Loc --      Pain Edu? --      Excl. in GC? --      Constitutional: Alert and oriented. Well appearing and in no acute distress. Eyes: Conjunctivae are normal. PERRL. EOMI. Head: Atraumatic. ENT:      Nose: No congestion/rhinnorhea.      Mouth/Throat: Mucous membranes are moist.  Neck: No stridor.  No cervical  spine tenderness to palpation. Cardiovascular: Normal rate, regular rhythm. Normal S1 and S2.  Good peripheral circulation. Respiratory: Normal respiratory effort without tachypnea or retractions. Lungs CTAB. Good air entry to the bases with no decreased or absent breath sounds Gastrointestinal: Bowel sounds x 4 quadrants. Soft and nontender to palpation. No guarding or rigidity. No distention. Musculoskeletal: Full range of motion to all extremities. No obvious deformities noted.  Patient has a positive straight leg raise test on the left. Neurologic:  Normal for age. No gross focal neurologic deficits are appreciated.  Skin:  Skin is warm, dry and intact. No rash noted. Psychiatric: Mood and affect are normal for age. Speech and behavior are normal.   ____________________________________________   LABS (all labs ordered are listed, but only abnormal results are displayed)  Labs Reviewed  CBC WITH DIFFERENTIAL/PLATELET - Abnormal; Notable for the following components:      Result Value   Hemoglobin 15.5 (*)    MCH 35.4 (*)    All other components within normal limits  COMPREHENSIVE METABOLIC PANEL - Abnormal; Notable for the following components:   Glucose, Bld 102 (*)    All other components within normal limits  CK   ____________________________________________  EKG   ____________________________________________  RADIOLOGY 10/16/20, personally viewed and evaluated these images (plain radiographs) as part of my medical decision making, as well as reviewing the written report by the radiologist.    MR LUMBAR SPINE WO CONTRAST  Result Date: 10/14/2020 CLINICAL DATA:  Low back pain EXAM: MRI LUMBAR SPINE WITHOUT CONTRAST TECHNIQUE: Multiplanar, multisequence MR imaging of the lumbar spine was performed. No intravenous contrast was administered. COMPARISON:  None. FINDINGS: Segmentation:  Standard. Alignment:  Physiologic. Vertebrae:  No fracture, evidence of discitis,  or bone lesion. Conus medullaris and cauda equina: Conus extends to the L2 level. Conus and cauda equina appear normal. Paraspinal and other soft tissues: Negative. Disc levels: L1-L2: Normal disc space and facet joints. No spinal canal stenosis. No neural foraminal stenosis. L2-L3: Small disc bulge and mild facet hypertrophy. No spinal canal stenosis. No neural foraminal stenosis. L3-L4: Small disc bulge with bilateral extraforaminal/foraminal annular fissures. No spinal canal stenosis. No neural foraminal stenosis. L4-L5: Small disc bulge with mild facet hypertrophy. No spinal canal stenosis. No neural foraminal stenosis. L5-S1: Small central disc protrusion. No spinal canal stenosis. No neural foraminal stenosis. Visualized sacrum: Normal. IMPRESSION: Mild lumbar degenerative disc disease without spinal canal or neural foraminal stenosis. Electronically Signed   By: 10/16/2020 M.D.   On: 10/14/2020 22:25   10/16/2020 Venous Img Lower Unilateral Left  Result Date: 10/14/2020 CLINICAL DATA:  Leg swelling.  Pain and swelling for 5 days. EXAM: LEFT LOWER EXTREMITY VENOUS DOPPLER ULTRASOUND TECHNIQUE: Gray-scale sonography with compression, as well as color and duplex ultrasound, were performed to evaluate the deep venous system(s) from the level of the common femoral vein through the popliteal and proximal calf veins. COMPARISON:  None. FINDINGS: VENOUS Normal compressibility of the common femoral, superficial femoral, and popliteal veins, as well as the visualized calf veins. Visualized portions of profunda femoral vein and great saphenous vein unremarkable. No filling defects to suggest DVT on grayscale or color Doppler imaging. Doppler waveforms show normal direction of venous flow, normal respiratory plasticity and response to augmentation. Limited views of the contralateral common femoral vein are unremarkable. OTHER None. Limitations: none IMPRESSION: No evidence of left lower extremity DVT. Electronically  Signed   By: Narda Rutherford M.D.   On: 10/14/2020 20:07    ____________________________________________    PROCEDURES  Procedure(s) performed:     Procedures     Medications - No data to display   ____________________________________________   INITIAL IMPRESSION / ASSESSMENT AND PLAN / ED COURSE  Pertinent labs & imaging results that were available during my care of the patient were reviewed by me and considered in my medical decision making (see chart for details).      Assessment and plan:  Leg weakness  55 year old female presents to the emergency department with concern for new onset leg weakness and bilateral leg discomfort, left worse than right.  Patient was hypertensive at triage and crying on exam.   CBC and CMP were reassuring and CK was within reference range.   No evidence of cauda equina on MRI lumbar spine and no evidence of DVT on dedicated venous ultrasound.  Patient was placed on a tapered steroid for likely lumbar radiculopathy.  Patient was advised to follow-up with her primary care provider as needed.  A work note was provided at her request.     ____________________________________________  FINAL CLINICAL IMPRESSION(S) / ED DIAGNOSES  Final diagnoses:  Pain of left lower extremity      NEW MEDICATIONS STARTED DURING THIS VISIT:  ED Discharge Orders          Ordered    predniSONE (STERAPRED UNI-PAK 21 TAB) 10 MG (21) TBPK tablet        10/14/20 2320                This chart was dictated using voice recognition software/Dragon. Despite best efforts to proofread, errors can occur which can change the meaning. Any change was purely unintentional.     Orvil Feil, PA-C 10/14/20 2349    Merwyn Katos, MD 10/17/20 [phone]

## 2020-10-14 NOTE — Discharge Instructions (Addendum)
Take tapered steroid as directed.  

## 2020-10-14 NOTE — ED Notes (Signed)
Patient transported to MRI 

## 2020-10-14 NOTE — ED Triage Notes (Signed)
Pt comes into the ED via POV c/o bilateral leg pain.  Pt denies any known injury to the legs and states the pain is worse in the left leg.  Pt states its hard to describe the pain, but that the pain started over the weekend and now it hurts to walk.  Pt denies any noticeable swelling to the legs.  PT tearful in triage at this time.

## 2020-10-14 NOTE — ED Notes (Signed)
Patient provided with phone to be screened for MRI

## 2021-09-29 ENCOUNTER — Emergency Department
Admission: EM | Admit: 2021-09-29 | Discharge: 2021-09-29 | Disposition: A | Discharge status: Home / Self Care | Payer: No Typology Code available for payment source | Attending: Emergency Medicine | Admitting: Emergency Medicine

## 2021-09-29 ENCOUNTER — Other Ambulatory Visit: Payer: Self-pay

## 2021-09-29 DIAGNOSIS — T63391A Toxic effect of venom of other spider, accidental (unintentional), initial encounter: Secondary | ICD-10-CM | POA: Diagnosis not present

## 2021-09-29 DIAGNOSIS — J45909 Unspecified asthma, uncomplicated: Secondary | ICD-10-CM | POA: Insufficient documentation

## 2021-09-29 DIAGNOSIS — T63301A Toxic effect of unspecified spider venom, accidental (unintentional), initial encounter: Secondary | ICD-10-CM

## 2021-09-29 DIAGNOSIS — I10 Essential (primary) hypertension: Secondary | ICD-10-CM | POA: Insufficient documentation

## 2021-09-29 DIAGNOSIS — Z79899 Other long term (current) drug therapy: Secondary | ICD-10-CM | POA: Diagnosis not present

## 2021-09-29 MED ORDER — CEPHALEXIN 500 MG PO CAPS: 500.0000 mg | ORAL_CAPSULE | Freq: Four times a day (QID) | ORAL | 0 refills | Status: DC

## 2021-09-29 NOTE — ED Triage Notes (Signed)
Pt presents to ER c/o spider bite that she states happened yesterday morning.  Pt states she walked into a spider web, and had gone to take a nap, and when she woke up, felt a know to her upper neck.  Pt has small area of redness that is raised and painful to touch.  Pt states he does not know what the spider looked like.  Pt is A&O x4 at this time in NAD in triage.

## 2021-09-29 NOTE — ED Notes (Signed)
See triage note  Presents with possible insect bite to back of neck  Red swollen area noted to neck  No fever

## 2021-09-29 NOTE — ED Provider Notes (Signed)
[facility] Provider Note    Event Date/Time   First MD Initiated Contact with Patient 09/29/21 [phone]     (approximate)   History   Insect Bite   HPI  Patricia [name] is a 56 y.o. female   presents to the ED with complaint of spider bite that happened yesterday morning.  Patient states she walked into a spiderweb and did not notice a spider on her.  She states that she took a nap and when she woke up she felt a small area on the back of her scalp that is tender to touch.  Patient denies any muscle cramping, fever.  Patient has a history of hypertension and asthma.  Currently she is taking blood pressure medication but does not know the name of her medication.  She was made aware that her blood pressure is elevated and she states she has an appointment with her PCP on August 8.      Physical Exam   Triage Vital Signs: ED Triage Vitals  Enc Vitals Group     BP 09/29/21 0301 (!) 160/102     Pulse Rate 09/29/21 0301 77     Resp 09/29/21 0301 18     Temp 09/29/21 0301 98.2 F (36.8 C)     Temp Source 09/29/21 0301 Oral     SpO2 09/29/21 0301 97 %     Weight 09/29/21 0300 178 lb (80.7 kg)     Height 09/29/21 0300 5\' 5"  (1.651 m)     Head Circumference --      Peak Flow --      Pain Score 09/29/21 0259 8     Pain Loc --      Pain Edu? --      Excl. in GC? --     Most recent vital signs: Vitals:   09/29/21 0301 09/29/21 0754  BP: (!) 160/102 (!) 158/98  Pulse: 77 70  Resp: 18 18  Temp: 98.2 F (36.8 C)   SpO2: 97% 98%     General: Awake, no distress.  CV:  Good peripheral perfusion.  Resp:  Normal effort.  Abd:  No distention.  Other:  Posterior scalp left there is a small, single mildly erythematous nodule with moderate tenderness to palpation.  No drainage.  Area does have a central area which may represent the spider bite.  Area measures approximately 0.5 cm in diameter.   ED Results / Procedures / Treatments   Labs (all labs ordered  are listed, but only abnormal results are displayed) Labs Reviewed - No data to display    PROCEDURES:  Critical Care performed:   Procedures   MEDICATIONS ORDERED IN ED: Medications - No data to display   IMPRESSION / MDM / ASSESSMENT AND PLAN / ED COURSE  I reviewed the triage vital signs and the nursing notes.   Differential diagnosis includes, but is not limited to, spider bite, insect bite, cellulitis, infected spider bite.  56 year old female presents to the ED with possible spider bite to posterior scalp.  Patient states that she did walk through a spiderweb but was unaware that she had a bite.  She now has a tender erythematous area to the posterior scalp without active drainage.  Patient is encouraged to use warm moist compresses to the area and patient was made aware that a prescription was sent to her pharmacy to begin taking today.  She is to return to the emergency department if any severe worsening of her  symptoms or follow-up with her PCP.      Patient's presentation is most consistent with acute, uncomplicated illness.  FINAL CLINICAL IMPRESSION(S) / ED DIAGNOSES   Final diagnoses:  Spider bite wound, accidental or unintentional, initial encounter     Rx / DC Orders   ED Discharge Orders          Ordered    cephALEXin (KEFLEX) 500 MG capsule  4 times daily        09/29/21 0730             Note:  This document was prepared using Dragon voice recognition software and may include unintentional dictation errors.   Tommi Rumps, PA-C 09/29/21 0944    Gilles Chiquito, MD 09/29/21 [phone]

## 2021-09-29 NOTE — Discharge Instructions (Addendum)
Follow-up with your primary care provider and have your blood pressure rechecked on your scheduled appointment this month.  Begin taking Keflex 500 mg 4 times daily until completely finished.  You may apply cortisone cream to the area to prevent scratching at it.  Warm moist compresses to the area frequently would also help.  Tylenol if needed for pain.

## 2022-04-09 ENCOUNTER — Emergency Department (HOSPITAL_COMMUNITY): Payer: 59

## 2022-04-09 ENCOUNTER — Inpatient Hospital Stay (HOSPITAL_COMMUNITY): Payer: 59

## 2022-04-09 ENCOUNTER — Encounter (HOSPITAL_COMMUNITY)
Admission: EM | Disposition: A | Discharge status: Home / Self Care | Payer: Self-pay | Source: Home / Self Care | Attending: Neurology

## 2022-04-09 ENCOUNTER — Inpatient Hospital Stay (HOSPITAL_COMMUNITY)
Admission: EM | Admit: 2022-04-09 | Discharge: 2022-04-12 | DRG: 023 | Disposition: A | Discharge status: Home / Self Care | Payer: 59 | Attending: Neurology | Admitting: Neurology

## 2022-04-09 ENCOUNTER — Inpatient Hospital Stay (HOSPITAL_COMMUNITY): Payer: 59 | Admitting: Certified Registered Nurse Anesthetist

## 2022-04-09 DIAGNOSIS — Z9851 Tubal ligation status: Secondary | ICD-10-CM | POA: Diagnosis not present

## 2022-04-09 DIAGNOSIS — F1210 Cannabis abuse, uncomplicated: Secondary | ICD-10-CM | POA: Diagnosis present

## 2022-04-09 DIAGNOSIS — J45909 Unspecified asthma, uncomplicated: Secondary | ICD-10-CM | POA: Diagnosis not present

## 2022-04-09 DIAGNOSIS — Z885 Allergy status to narcotic agent status: Secondary | ICD-10-CM | POA: Diagnosis not present

## 2022-04-09 DIAGNOSIS — R1310 Dysphagia, unspecified: Secondary | ICD-10-CM | POA: Diagnosis not present

## 2022-04-09 DIAGNOSIS — E785 Hyperlipidemia, unspecified: Secondary | ICD-10-CM | POA: Diagnosis present

## 2022-04-09 DIAGNOSIS — Z1152 Encounter for screening for COVID-19: Secondary | ICD-10-CM

## 2022-04-09 DIAGNOSIS — R29810 Facial weakness: Secondary | ICD-10-CM | POA: Diagnosis present

## 2022-04-09 DIAGNOSIS — E876 Hypokalemia: Secondary | ICD-10-CM | POA: Diagnosis present

## 2022-04-09 DIAGNOSIS — F17210 Nicotine dependence, cigarettes, uncomplicated: Secondary | ICD-10-CM | POA: Diagnosis not present

## 2022-04-09 DIAGNOSIS — J9600 Acute respiratory failure, unspecified whether with hypoxia or hypercapnia: Secondary | ICD-10-CM | POA: Diagnosis not present

## 2022-04-09 DIAGNOSIS — Z79899 Other long term (current) drug therapy: Secondary | ICD-10-CM

## 2022-04-09 DIAGNOSIS — I63511 Cerebral infarction due to unspecified occlusion or stenosis of right middle cerebral artery: Principal | ICD-10-CM | POA: Diagnosis present

## 2022-04-09 DIAGNOSIS — H5500 Unspecified nystagmus: Secondary | ICD-10-CM | POA: Diagnosis present

## 2022-04-09 DIAGNOSIS — I724 Aneurysm of artery of lower extremity: Secondary | ICD-10-CM | POA: Diagnosis not present

## 2022-04-09 DIAGNOSIS — R251 Tremor, unspecified: Secondary | ICD-10-CM | POA: Diagnosis not present

## 2022-04-09 DIAGNOSIS — I639 Cerebral infarction, unspecified: Principal | ICD-10-CM

## 2022-04-09 DIAGNOSIS — I10 Essential (primary) hypertension: Secondary | ICD-10-CM | POA: Diagnosis not present

## 2022-04-09 DIAGNOSIS — M62838 Other muscle spasm: Secondary | ICD-10-CM | POA: Diagnosis present

## 2022-04-09 DIAGNOSIS — I729 Aneurysm of unspecified site: Secondary | ICD-10-CM | POA: Diagnosis not present

## 2022-04-09 DIAGNOSIS — G460 Middle cerebral artery syndrome: Secondary | ICD-10-CM | POA: Diagnosis present

## 2022-04-09 DIAGNOSIS — R000 Tachycardia, unspecified: Secondary | ICD-10-CM | POA: Diagnosis not present

## 2022-04-09 DIAGNOSIS — Z9049 Acquired absence of other specified parts of digestive tract: Secondary | ICD-10-CM

## 2022-04-09 DIAGNOSIS — I6601 Occlusion and stenosis of right middle cerebral artery: Secondary | ICD-10-CM

## 2022-04-09 DIAGNOSIS — G8194 Hemiplegia, unspecified affecting left nondominant side: Secondary | ICD-10-CM | POA: Diagnosis present

## 2022-04-09 DIAGNOSIS — T80818A Extravasation of other vesicant agent, initial encounter: Secondary | ICD-10-CM | POA: Diagnosis not present

## 2022-04-09 DIAGNOSIS — Y838 Other surgical procedures as the cause of abnormal reaction of the patient, or of later complication, without mention of misadventure at the time of the procedure: Secondary | ICD-10-CM | POA: Diagnosis not present

## 2022-04-09 DIAGNOSIS — R471 Dysarthria and anarthria: Secondary | ICD-10-CM | POA: Diagnosis present

## 2022-04-09 HISTORY — PX: RADIOLOGY WITH ANESTHESIA: SHX6223

## 2022-04-09 LAB — DIFFERENTIAL
Abs Immature Granulocytes: 0.02 10*3/uL (ref 0.00–0.07)
Basophils Absolute: 0 10*3/uL (ref 0.0–0.1)
Basophils Relative: 0 %
Eosinophils Absolute: 0.1 10*3/uL (ref 0.0–0.5)
Eosinophils Relative: 1 %
Immature Granulocytes: 0 %
Lymphocytes Relative: 34 %
Lymphs Abs: 3.1 10*3/uL (ref 0.7–4.0)
Monocytes Absolute: 0.6 10*3/uL (ref 0.1–1.0)
Monocytes Relative: 7 %
Neutro Abs: 5.3 10*3/uL (ref 1.7–7.7)
Neutrophils Relative %: 58 %

## 2022-04-09 LAB — COMPREHENSIVE METABOLIC PANEL
ALT: 11 U/L (ref 0–44)
AST: 25 U/L (ref 15–41)
Albumin: 3.8 g/dL (ref 3.5–5.0)
Alkaline Phosphatase: 84 U/L (ref 38–126)
Anion gap: 8 (ref 5–15)
BUN: 14 mg/dL (ref 6–20)
CO2: 26 mmol/L (ref 22–32)
Calcium: 9 mg/dL (ref 8.9–10.3)
Chloride: 101 mmol/L (ref 98–111)
Creatinine, Ser: 1.01 mg/dL — ABNORMAL HIGH (ref 0.44–1.00)
GFR, Estimated: >60 mL/min (ref >60)
Glucose, Bld: 110 mg/dL — ABNORMAL HIGH (ref 70–99)
Potassium: 3.1 mmol/L — ABNORMAL LOW (ref 3.5–5.1)
Sodium: 135 mmol/L (ref 135–145)
Total Bilirubin: 0.7 mg/dL (ref 0.3–1.2)
Total Protein: 6.6 g/dL (ref 6.5–8.1)

## 2022-04-09 LAB — PROTIME-INR
INR: 0.9 (ref 0.8–1.2)
Prothrombin Time: 12.5 seconds (ref 11.4–15.2)

## 2022-04-09 LAB — I-STAT CHEM 8, ED
BUN: 16 mg/dL (ref 6–20)
Calcium, Ion: 1.13 mmol/L — ABNORMAL LOW (ref 1.15–1.40)
Chloride: 101 mmol/L (ref 98–111)
Creatinine, Ser: 1 mg/dL (ref 0.44–1.00)
Glucose, Bld: 107 mg/dL — ABNORMAL HIGH (ref 70–99)
HCT: 35 % — ABNORMAL LOW (ref 36.0–46.0)
Hemoglobin: 11.9 g/dL — ABNORMAL LOW (ref 12.0–15.0)
Potassium: 3.1 mmol/L — ABNORMAL LOW (ref 3.5–5.1)
Sodium: 140 mmol/L (ref 135–145)
TCO2: 29 mmol/L (ref 22–32)

## 2022-04-09 LAB — CBC
HCT: 33.5 % — ABNORMAL LOW (ref 36.0–46.0)
Hemoglobin: 12.1 g/dL (ref 12.0–15.0)
MCH: 33.4 pg (ref 26.0–34.0)
MCHC: 36.1 g/dL — ABNORMAL HIGH (ref 30.0–36.0)
MCV: 92.5 fL (ref 80.0–100.0)
Platelets: 226 10*3/uL (ref 150–400)
RBC: 3.62 MIL/uL — ABNORMAL LOW (ref 3.87–5.11)
RDW: 11.4 % — ABNORMAL LOW (ref 11.5–15.5)
WBC: 9.1 10*3/uL (ref 4.0–10.5)
nRBC: 0 % (ref 0.0–0.2)

## 2022-04-09 LAB — TSH: TSH: 0.552 u[IU]/mL (ref 0.350–4.500)

## 2022-04-09 LAB — I-STAT BETA HCG BLOOD, ED (MC, WL, AP ONLY): I-stat hCG, quantitative: <5 m[IU]/mL (ref <5)

## 2022-04-09 LAB — CBG MONITORING, ED: Glucose-Capillary: 112 mg/dL — ABNORMAL HIGH (ref 70–99)

## 2022-04-09 LAB — HEMOGLOBIN A1C
Hgb A1c MFr Bld: 5.1 % (ref 4.8–5.6)
Mean Plasma Glucose: 99.67 mg/dL

## 2022-04-09 LAB — APTT: aPTT: 31 seconds (ref 24–36)

## 2022-04-09 SURGERY — RADIOLOGY WITH ANESTHESIA
Anesthesia: General

## 2022-04-09 MED ORDER — ACETAMINOPHEN 160 MG/5ML PO SOLN: 650.0000 mg | ORAL | Status: DC | PRN

## 2022-04-09 MED ORDER — IOHEXOL 300 MG/ML  SOLN
150.0000 mL | Freq: Once | INTRAMUSCULAR | Status: AC | PRN
  Administered 2022-04-10: 96 mL via INTRA_ARTERIAL

## 2022-04-09 MED ORDER — CEFAZOLIN SODIUM-DEXTROSE 2-4 GM/100ML-% IV SOLN
INTRAVENOUS | Status: AC
  Filled 2022-04-09: qty 100

## 2022-04-09 MED ORDER — SENNOSIDES-DOCUSATE SODIUM 8.6-50 MG PO TABS
1.0000 | ORAL_TABLET | Freq: Every evening | ORAL | Status: DC | PRN
  Filled 2022-04-09: qty 1

## 2022-04-09 MED ORDER — ASPIRIN 81 MG PO CHEW
CHEWABLE_TABLET | ORAL | Status: AC
  Filled 2022-04-09: qty 1

## 2022-04-09 MED ORDER — CLEVIDIPINE BUTYRATE 0.5 MG/ML IV EMUL: 0.0000 mg/h | INTRAVENOUS | Status: DC

## 2022-04-09 MED ORDER — DEXAMETHASONE SODIUM PHOSPHATE 10 MG/ML IJ SOLN
INTRAMUSCULAR | Status: DC | PRN
  Administered 2022-04-09: 10 mg via INTRAVENOUS

## 2022-04-09 MED ORDER — ONDANSETRON HCL 4 MG/2ML IJ SOLN
INTRAMUSCULAR | Status: DC | PRN
  Administered 2022-04-09: 4 mg via INTRAVENOUS

## 2022-04-09 MED ORDER — NITROGLYCERIN 1 MG/10 ML FOR IR/CATH LAB
INTRA_ARTERIAL | Status: AC
  Filled 2022-04-09: qty 10

## 2022-04-09 MED ORDER — SODIUM CHLORIDE 0.9 % IV SOLN
2.0000 ug/kg/min | Freq: Once | INTRAVENOUS | Status: AC
  Administered 2022-04-09: 2 ug/kg/min via INTRAVENOUS

## 2022-04-09 MED ORDER — PROPOFOL 10 MG/ML IV BOLUS
INTRAVENOUS | Status: DC | PRN
  Administered 2022-04-09: 50 mg via INTRAVENOUS

## 2022-04-09 MED ORDER — LORAZEPAM 2 MG/ML IJ SOLN
INTRAMUSCULAR | Status: AC
  Administered 2022-04-09: 2 mg via INTRAVENOUS
  Filled 2022-04-09: qty 1

## 2022-04-09 MED ORDER — STROKE: EARLY STAGES OF RECOVERY BOOK
Freq: Once | Status: AC
  Filled 2022-04-09: qty 1

## 2022-04-09 MED ORDER — ROCURONIUM BROMIDE 100 MG/10ML IV SOLN
INTRAVENOUS | Status: DC | PRN
  Administered 2022-04-09: 10 mg via INTRAVENOUS
  Administered 2022-04-09: 60 mg via INTRAVENOUS

## 2022-04-09 MED ORDER — CEFAZOLIN SODIUM-DEXTROSE 2-3 GM-%(50ML) IV SOLR
INTRAVENOUS | Status: DC | PRN
  Administered 2022-04-09: 2 g via INTRAVENOUS

## 2022-04-09 MED ORDER — CANGRELOR TETRASODIUM 50 MG IV SOLR
INTRAVENOUS | Status: AC
  Filled 2022-04-09: qty 50

## 2022-04-09 MED ORDER — TENECTEPLASE FOR STROKE
0.2500 mg/kg | PACK | Freq: Once | INTRAVENOUS | Status: AC
  Administered 2022-04-09: 20 mg via INTRAVENOUS
  Filled 2022-04-09: qty 10

## 2022-04-09 MED ORDER — LORAZEPAM 2 MG/ML IJ SOLN: 2.0000 mg | Freq: Once | INTRAMUSCULAR | Status: AC

## 2022-04-09 MED ORDER — CANGRELOR BOLUS VIA INFUSION
15.0000 ug/kg | Freq: Once | INTRAVENOUS | Status: AC
  Administered 2022-04-09: 1203 ug via INTRAVENOUS

## 2022-04-09 MED ORDER — IOHEXOL 350 MG/ML SOLN
75.0000 mL | Freq: Once | INTRAVENOUS | Status: AC | PRN
  Administered 2022-04-09: 75 mL via INTRAVENOUS

## 2022-04-09 MED ORDER — SODIUM CHLORIDE 0.9% FLUSH
3.0000 mL | Freq: Once | INTRAVENOUS | Status: AC
  Administered 2022-04-09: 3 mL via INTRAVENOUS

## 2022-04-09 MED ORDER — LIDOCAINE HCL (CARDIAC) PF 100 MG/5ML IV SOSY
PREFILLED_SYRINGE | INTRAVENOUS | Status: DC | PRN
  Administered 2022-04-09: 60 mg via INTRATRACHEAL

## 2022-04-09 MED ORDER — SUCCINYLCHOLINE CHLORIDE 200 MG/10ML IV SOSY
PREFILLED_SYRINGE | INTRAVENOUS | Status: DC | PRN
  Administered 2022-04-09: 80 mg via INTRAVENOUS

## 2022-04-09 MED ORDER — ASPIRIN 81 MG PO CHEW
CHEWABLE_TABLET | ORAL | Status: AC | PRN
  Administered 2022-04-09: 81 mg

## 2022-04-09 MED ORDER — TICAGRELOR 60 MG PO TABS
ORAL_TABLET | ORAL | Status: AC | PRN
  Administered 2022-04-09: 180 mg

## 2022-04-09 MED ORDER — IPRATROPIUM-ALBUTEROL 0.5-2.5 (3) MG/3ML IN SOLN: 3.0000 mL | RESPIRATORY_TRACT | Status: DC | PRN

## 2022-04-09 MED ORDER — PHENYLEPHRINE HCL (PRESSORS) 10 MG/ML IV SOLN
INTRAVENOUS | Status: DC | PRN
  Administered 2022-04-09: 160 ug via INTRAVENOUS
  Administered 2022-04-09: 80 ug via INTRAVENOUS
  Administered 2022-04-09: 160 ug via INTRAVENOUS
  Administered 2022-04-09: 80 ug via INTRAVENOUS
  Administered 2022-04-09: 160 ug via INTRAVENOUS
  Administered 2022-04-10: 80 ug via INTRAVENOUS

## 2022-04-09 MED ORDER — PANTOPRAZOLE SODIUM 40 MG IV SOLR
40.0000 mg | Freq: Every day | INTRAVENOUS | Status: DC
  Administered 2022-04-10: 40 mg via INTRAVENOUS
  Filled 2022-04-09: qty 10

## 2022-04-09 MED ORDER — ACETAMINOPHEN 650 MG RE SUPP: 650.0000 mg | RECTAL | Status: DC | PRN

## 2022-04-09 MED ORDER — SODIUM CHLORIDE 0.9 % IV SOLN: INTRAVENOUS | Status: DC

## 2022-04-09 MED ORDER — TICAGRELOR 90 MG PO TABS
ORAL_TABLET | ORAL | Status: AC
  Filled 2022-04-09: qty 2

## 2022-04-09 MED ORDER — ACETAMINOPHEN 325 MG PO TABS: 650.0000 mg | ORAL_TABLET | ORAL | Status: DC | PRN

## 2022-04-09 MED ORDER — EPHEDRINE SULFATE (PRESSORS) 50 MG/ML IJ SOLN
INTRAMUSCULAR | Status: DC | PRN
  Administered 2022-04-09 (×3): 5 mg via INTRAVENOUS

## 2022-04-09 NOTE — ED Provider Notes (Signed)
[facility] Provider Note   CSN: [id number] Arrival date & time: 04/09/22  2149     History  No chief complaint on file.   Patricia [name] is a 57 y.o. female.  Patient is a 57 year old female with a past medical history of hypertension, recently started on antihypertensives presenting to the emergency department with left-sided numbness and weakness.  Per EMS, the patient was sitting on the couch watching TV with her family members when around 9 PM this evening she complained that her left side of her body felt numb.  911 was called and on medics arrival they noted that she had numbness and weakness on her left side.  Prehospital arrival stroke alert was called.  Patient denied any blood thinner use.  Further history was limited due to critical acuity.  The history is provided by the EMS personnel. The history is limited by the condition of the patient.       Home Medications Prior to Admission medications   Medication Sig Start Date End Date Taking? Authorizing Provider  albuterol (PROVENTIL HFA;VENTOLIN HFA) 108 (90 Base) MCG/ACT inhaler Inhale 2 puffs into the lungs every 6 (six) hours as needed for wheezing or shortness of breath. 07/08/16   Delman Kitten, MD  cephALEXin (KEFLEX) 500 MG capsule Take 1 capsule (500 mg total) by mouth 4 (four) times daily. 09/29/21   Johnn Hai, PA-C  hydrochlorothiazide (MICROZIDE) 12.5 MG capsule Take 1 capsule (12.5 mg total) by mouth daily. 07/22/16   Merlyn Lot, MD      Allergies    Tramadol    Review of Systems   Review of Systems  Physical Exam Updated Vital Signs Wt 80.2 kg   BMI 29.42 kg/m  Physical Exam Vitals and nursing note reviewed.  Constitutional:      General: She is not in acute distress.    Appearance: Normal appearance.  HENT:     Head: Normocephalic and atraumatic.     Nose: Nose normal.     Mouth/Throat:     Mouth: Mucous membranes are moist.     Pharynx:  Oropharynx is clear.  Eyes:     Extraocular Movements: Extraocular movements intact.     Conjunctiva/sclera: Conjunctivae normal.     Comments: R-sided gaze preference   Cardiovascular:     Rate and Rhythm: Normal rate and regular rhythm.  Pulmonary:     Effort: Pulmonary effort is normal.  Abdominal:     General: Abdomen is flat.  Musculoskeletal:     Cervical back: Normal range of motion and neck supple.     Right lower leg: No edema.     Left lower leg: No edema.  Skin:    General: Skin is warm and dry.  Neurological:     Mental Status: She is alert.     Comments: L-sided facial droop L-sided flaccid paralysis with decreased sensation No drift in RUE/RLE Normal finger to nose of RUE  Psychiatric:        Behavior: Behavior normal.     ED Results / Procedures / Treatments   Labs (all labs ordered are listed, but only abnormal results are displayed) Labs Reviewed  CBC - Abnormal; Notable for the following components:      Result Value   RBC 3.62 (*)    HCT 33.5 (*)    MCHC 36.1 (*)    RDW 11.4 (*)    All other components within normal limits  I-STAT CHEM  8, ED - Abnormal; Notable for the following components:   Potassium 3.1 (*)    Glucose, Bld 107 (*)    Calcium, Ion 1.13 (*)    Hemoglobin 11.9 (*)    HCT 35.0 (*)    All other components within normal limits  CBG MONITORING, ED - Abnormal; Notable for the following components:   Glucose-Capillary 112 (*)    All other components within normal limits  RESP PANEL BY RT-PCR (RSV, FLU A&B, COVID)  RVPGX2  PROTIME-INR  APTT  DIFFERENTIAL  COMPREHENSIVE METABOLIC PANEL  ETHANOL  I-STAT BETA HCG BLOOD, ED ([facility], [facility], [facility] ONLY)    EKG None  Radiology CT HEAD CODE STROKE WO CONTRAST  Result Date: 04/09/2022 CLINICAL DATA:  Code stroke. Initial evaluation for neuro deficit, stroke. EXAM: CT HEAD WITHOUT CONTRAST TECHNIQUE: Contiguous axial images were obtained from the base of the skull through the vertex without  intravenous contrast. RADIATION DOSE REDUCTION: This exam was performed according to the departmental dose-optimization program which includes automated exposure control, adjustment of the mA and/or kV according to patient size and/or use of iterative reconstruction technique. COMPARISON:  None Available. FINDINGS: Brain: Cerebral volume within normal limits. No acute intracranial hemorrhage. There is subtle loss of gray-white matter differentiation involving the right insula as well as the right caudate and lentiform nuclei, concerning for early acute right MCA territory infarct. Gray-white matter differentiation otherwise grossly maintained. No mass lesion or midline shift. No hydrocephalus or extra-axial fluid collection. Tiny lipoma noted near the quadrigeminal plate cistern. Vascular: Dense right M1 segment, concerning for thrombus in large vessel occlusion. Skull: Scalp soft tissues and calvarium demonstrate no acute finding. Sinuses/Orbits: Right gaze preference noted. Paranasal sinuses are largely clear. No mastoid effusion. Other: None. ASPECTS [facility] Stroke Program Early CT Score) - Ganglionic level infarction (caudate, lentiform nuclei, internal capsule, insula, M1-M3 cortex): 4 - Supraganglionic infarction (M4-M6 cortex): 3 Total score (0-10 with 10 being normal): 7 IMPRESSION: 1. Dense right M1 segment, concerning for thrombus/large vessel occlusion. 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 No intracranial hemorrhage. 2. ASPECTS is 7. These results were communicated to Dr. Curly Shores at 10:11 pm on 04/09/2022 by text page via the [facility] messaging system. Electronically Signed   By: Jeannine Boga M.D.   On: 04/09/2022 22:16    Procedures .Critical Care  Performed by: Kemper Durie, DO Authorized by: Kemper Durie, DO   Critical care provider statement:    Critical care time (minutes):  35     Medications Ordered in ED Medications  sodium chloride flush (NS)  0.9 % injection 3 mL (has no administration in time range)  tenecteplase (TNKASE) injection for Stroke 20 mg (has no administration in time range)  LORazepam (ATIVAN) injection 2 mg (has no administration in time range)  LORazepam (ATIVAN) 2 MG/ML injection (has no administration in time range)  iohexol (OMNIPAQUE) 350 MG/ML injection 75 mL (75 mLs Intravenous Contrast Given 04/09/22 2231)    ED Course/ Medical Decision Making/ A&P                             Medical Decision Making This patient presents to the ED with chief complaint(s) of L-sided numbness/weakness with pertinent past medical history of HTN which further complicates the presenting complaint. The complaint involves an extensive differential diagnosis and also carries with it a high risk of complications and morbidity.    The differential diagnosis includes CVA, ICH,  mass effect, hypo or hyperglycemia, hypertensive emergency or urgency  Additional history obtained: Additional history obtained from EMS  Records reviewed prior ED records  ED Course and Reassessment: Patient was called a prehospital arrival stroke alert and neurology and myself were immediately present upon EMS arrival at the door.  The patient's airway was intact and she was immediately brought to CT scanner.  She was noted to have left-sided facial droop with right gaze deviation and weakness in her left upper extremity and lower extremity, concerning for CVA.  Her last known well was 9 PM and she is not on any blood thinners.  Head CT was performed that showed concern for acute MCA infarct.  Neurology deemed the patient TNK candidate and she will be given TNK.  Neurology plans to speak with IR as she is likely thrombectomy candidate and she will be admitted to the neurology service.  Independent labs interpretation:  The following labs were independently interpreted: Within normal range  Independent visualization of imaging: - I independently visualized the  following imaging with scope of interpretation limited to determining acute life threatening conditions related to emergency care: CT head, which revealed acute right MCA infarct  Consultation: - Consulted or discussed management/test interpretation w/ external professional: Neurology  Consideration for admission or further workup: Patient requires admission for further management of her acute stroke Social Determinants of health: N/A    Amount and/or Complexity of Data Reviewed Labs: ordered. Radiology: ordered.  Risk Decision regarding hospitalization.          Final Clinical Impression(s) / ED Diagnoses Final diagnoses:  Cerebrovascular accident (CVA), unspecified mechanism [facility])    Rx / [place] Orders ED Discharge Orders     None         Kemper Durie, DO 04/09/22 2232

## 2022-04-09 NOTE — H&P (Signed)
Neurology H&P  CC: Left-sided numbness  History is obtained from: Patient and chart review  HPI: Patricia [name] is a 57 y.o. female with a past medical history significant for smoking, hypertension, asthma.  She was sitting down at 9:10 PM when she had sudden onset left arm numbness.  EMS was activated and code stroke was activated for a right MCA syndrome.  She denies any other recent episodes of focal neurological symptoms, any headache.  Blood pressure is noted to be low given the circumstances (130s over 80s) consistently.  She notes she had recently started new blood pressure medications; had been on amlodipine for a while but recently added lisinopril and nifedipine.  She has been having some chest pain intermittently, but denies any fevers chills or infectious symptoms.  LKW: 10 PM Thrombolytic given?: Yes at 20:03   Checklist of contraindications was reviewed and negative. Risks, benefits and alternatives were discussed   ASPECTS of 7 felt to reflect early edematous changes rather than ischemia given LKW   IA performed?: No, or if yes, groin puncture time:   Delay due to needing Ativan 2 mg to obtain diagnostic scan  Premorbid modified rankin scale:      0 - No symptoms.  ROS: Limited review of systems in the emergent setting included full review of TNK checklist, confirmation patient is not having any headache and other details as documented above  Past Medical History:  Diagnosis Date   Asthma    Bronchitis    Hypertension    Past Surgical History:  Procedure Laterality Date   CHOLECYSTECTOMY     TUBAL LIGATION     Current Outpatient Medications  Medication Instructions   albuterol (PROVENTIL HFA;VENTOLIN HFA) 108 (90 Base) MCG/ACT inhaler 2 puffs, Inhalation, Every 6 hours PRN   cephALEXin (KEFLEX) 500 mg, Oral, 4 times daily   hydrochlorothiazide (MICROZIDE) 12.5 mg, Oral, Daily   No family history on file. Not obtained in the emergent setting  Social History:   reports that she has been smoking cigarettes. She has never used smokeless tobacco. She reports current alcohol use. She reports current drug use. Drug: Marijuana.   Exam: Current vital signs: There were no vitals taken for this visit. Vital signs in last 24 hours: BP: ()/()  Arterial Line BP: ()/()    Physical Exam  Constitutional: Appears well-developed and well-nourished.  Psych: Affect appropriate to situation, calm and cooperative Eyes: No scleral injection HENT: No oropharyngeal obstruction.  MSK: no joint deformities.  Cardiovascular: Normal rate and regular rhythm.  Respiratory: Effort normal, non-labored breathing GI: Soft.  No distension. There is no tenderness.  Skin: Warm dry and intact visible skin  Neuro: Mental Status: Patient is awake, alert, oriented to person, place, month, year, and situation. Patient is able to give a clear and coherent history. Mild neglect of the left side but able to identify her hand as her own Cranial Nerves: II: Visual Fields are notable for left hemianopia III,IV, VI: Marked right gaze preference but able to look fully to the left with encouragement V: Facial sensation is reduced in the left face VII: Facial movement is notable for left facial droop, progressing over the course of my evaluation VIII: hearing is intact to voice Motor: Tone is normal. Bulk is normal.  No drift in the right upper and lower extremity.  Flexion posturing in the left upper and lower extremity to noxious stimulation, no voluntary movement.  After TNK some involuntary left leg muscle spasms were noted, stimulus  provoked Sensory: Sensation is symmetric to light touch and temperature in the arms and legs. Cerebellar: FNF and HKS are intact on the right side Gait:  Deferred in acute setting   NIHSS total 14 Score breakdown: 1-point for gaze preference to the right, 2 points for left hemianopia, 2 points for left facial droop, 3 points for left upper  extremity weakness, 3 points for left lower extremity weakness, 1 point for sensory loss on the left side, 1-point for dysarthria, 1 point from partial neglect of the left side Performed at time of patient arrival to ED    I have reviewed labs in epic and the results pertinent to this consultation are:  Basic Metabolic Panel: Recent Labs  Lab 04/09/22 2150 04/09/22 2213  NA 135 140  K 3.1* 3.1*  CL 101 101  CO2 26  --   GLUCOSE 110* 107*  BUN 14 16  CREATININE 1.01* 1.00  CALCIUM 9.0  --     CBC: Recent Labs  Lab 04/09/22 2150 04/09/22 2213  WBC 9.1  --   NEUTROABS 5.3  --   HGB 12.1 11.9*  HCT 33.5* 35.0*  MCV 92.5  --   PLT 226  --     Coagulation Studies: Recent Labs    04/09/22 2150  LABPROT 12.5  INR 0.9      I have reviewed the images obtained:  Head CT personally reviewed, radiology report 1. Dense right M1 segment, concerning for thrombus/large vessel occlusion. 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 No intracranial hemorrhage. 2. ASPECTS is 7 [subtle loss of gray-white matter differentiation involving the right insula as well as the right caudate and lentiform nuclei] I favor these changes to be edema given timing of last known well  CTA head and neck personally reviewed, agree with radiology:   1. Positive CTA for emergent large vessel occlusion, with acute occlusion of the mid and distal right M1 segment. 2. Transverse filling defect involving the proximal cervical right ICA just distal to the bifurcation, most characteristic of a carotid web. Associated short-segment stenosis of up to 55% by NASCET criteria. 3. Fetal type origin of the PCAs with overall diminutive vertebrobasilar system.   Impression: Acute embolic appearing right M1 occlusion.  Etiology may be secondary to carotid web versus cardioembolic given patient's recent cardiac symptoms and normotensive blood pressure in the setting of large vessel occlusion.   Treated within 1 hour of symptom onset with TNK and now undergoing thrombectomy.  Recommendations:  # Right MCA acute ischemic stroke, embolic in appearance - Stroke labs HgbA1c, fasting lipid panel, TSH - MRI brain 24 hours post TNK and MRA ordered - Frequent neuro checks - Echocardiogram - Hold antiplatelets for 24 hours until post TNK head imaging completed  - SCDs for DVT prophylaxis - Telemetry monitoring - Blood pressure goal   - Post successful uncomplicated revascularization SBP 120 - 140 for 24 hours; if complications have arisen or only partial revascularization reach out to interventionalist or neurologist on call for BP goal - PT consult, OT consult, Speech consult - Admitted to stroke team  #Asthma -DuoNebs as needed -Avoid beta-blockers  #Hypertension -Blood pressure goal as above -Home medications include HCTZ, nifedipine, amlodipine  Lesleigh Noe MD-PhD [facility] [phone] Available 7 PM to 7 AM, outside of these hours please call Neurologist on call as listed on Amion.   CRITICAL CARE Performed by: Lorenza Chick   Total critical care time: 50 minutes  Critical care time was  exclusive of separately billable procedures and treating other patients.  Critical care was necessary to treat or prevent imminent or life-threatening deterioration, emergent evaluation, administration of thrombolytic and activation of code IR.  Critical care was time spent personally by me on the following activities: development of treatment plan with patient and/or surrogate as well as nursing, discussions with consultants, evaluation of patient's response to treatment, examination of patient, obtaining history from patient or surrogate, ordering and performing treatments and interventions, ordering and review of laboratory studies, ordering and review of radiographic studies, pulse oximetry and re-evaluation of patient's condition.

## 2022-04-09 NOTE — ED Triage Notes (Signed)
BIB EMS for code stroke. LKW 2110 patient reported sitting on couch watching TV felt left arm go numb. Patient is noted to have left side flaccid, left facial droopiness, and right sided gaze on arrival. Hx asthma, recently started BP meds  EMS vs: 148/89, 81 98%

## 2022-04-09 NOTE — Anesthesia Procedure Notes (Signed)
Procedure Name: Intubation Date/Time: 04/09/2022 10:46 PM  Performed by: Troi Florendo T, CRNAPre-anesthesia Checklist: Patient identified, Emergency Drugs available, Suction available and Patient being monitored Patient Re-evaluated:Patient Re-evaluated prior to induction Oxygen Delivery Method: Circle system utilized Preoxygenation: Pre-oxygenation with 100% oxygen Induction Type: IV induction, Rapid sequence and Cricoid Pressure applied Ventilation: Mask ventilation without difficulty Laryngoscope Size: Mac and 3 Grade View: Grade I Tube type: Oral Tube size: 7.5 mm Number of attempts: 1 Airway Equipment and Method: Stylet and Oral airway Placement Confirmation: ETT inserted through vocal cords under direct vision, positive ETCO2 and breath sounds checked- equal and bilateral Secured at: 22 cm Tube secured with: Tape Dental Injury: Teeth and Oropharynx as per pre-operative assessment

## 2022-04-09 NOTE — Anesthesia Preprocedure Evaluation (Addendum)
Anesthesia Evaluation  Patient identified by MRN, date of birth, ID bandPreop documentation limited or incomplete due to emergent nature of procedure.  Airway Mallampati: Unable to assess       Dental   Pulmonary asthma , Current Smoker   Pulmonary exam normal        Cardiovascular hypertension, Pt. on medications Normal cardiovascular exam     Neuro/Psych TOTAL LEFT SIDED WEAKNESS ARM DRIFT LEFT FACIAL DROOP CVA    GI/Hepatic ,,,(+)     substance abuse    Endo/Other    Renal/GU      Musculoskeletal   Abdominal   Peds  Hematology  (+) Blood dyscrasia, anemia   Anesthesia Other Findings Code Stroke  Reproductive/Obstetrics                             Anesthesia Physical Anesthesia Plan  ASA: 4 and emergent  Anesthesia Plan: General   Post-op Pain Management:    Induction: Intravenous  PONV Risk Score and Plan: 2 and Ondansetron, Dexamethasone and Treatment may vary due to age or medical condition  Airway Management Planned: Oral ETT  Additional Equipment:   Intra-op Plan:   Post-operative Plan: Possible Post-op intubation/ventilation  Informed Consent:   Plan Discussed with: CRNA  Anesthesia Plan Comments:         Anesthesia Quick Evaluation

## 2022-04-09 NOTE — Progress Notes (Signed)
PHARMACIST CODE STROKE RESPONSE  Notified to mix TNK at 2201 by Dr. Curly Shores TNK preparation completed at 2202  TNK dose = 20 mg IV over 5 seconds.   Issues/delays encountered (if applicable):   Patricia [name] 04/09/22 10:04 PM

## 2022-04-09 NOTE — Code Documentation (Signed)
Stroke Response Nurse Documentation Code Documentation  Patricia [name] Agent is a 57 y.o. female arriving to [facility]  via [facility] on 2/10 with past medical hx of HTN, asthma and tobacco use. On No antithrombotic. Code stroke was activated by EMS.   Patient from home where she was LKW at 2100 and now complaining of left sided weakness and left facial droop.   Stroke team at the bedside on patient arrival. Labs drawn and patient cleared for CT by Dr. Maylon Peppers. Patient to CT with team. NIHSS 17, see documentation for details and code stroke times. Patient with right gaze preference , left hemianopia, left facial droop, left arm weakness, left leg weakness, left decreased sensation, dysarthria , and Sensory  neglect on exam. The following imaging was completed:  CT Head and CTA. Patient is a candidate for IV Thrombolytic due to fixed neurological deficit. Patient is a candidate for IR due to Right M1 occlusion.   Care Plan: TNKase and mechanical thrombectomy..   Bedside handoff with IR RN Marylyn Ishihara.    Madelynn Done  Rapid Response RN

## 2022-04-10 ENCOUNTER — Inpatient Hospital Stay (HOSPITAL_COMMUNITY): Payer: 59

## 2022-04-10 ENCOUNTER — Other Ambulatory Visit (HOSPITAL_COMMUNITY): Payer: Self-pay

## 2022-04-10 DIAGNOSIS — I724 Aneurysm of artery of lower extremity: Secondary | ICD-10-CM

## 2022-04-10 DIAGNOSIS — I6601 Occlusion and stenosis of right middle cerebral artery: Secondary | ICD-10-CM | POA: Diagnosis present

## 2022-04-10 DIAGNOSIS — I63511 Cerebral infarction due to unspecified occlusion or stenosis of right middle cerebral artery: Secondary | ICD-10-CM

## 2022-04-10 DIAGNOSIS — I729 Aneurysm of unspecified site: Secondary | ICD-10-CM

## 2022-04-10 HISTORY — PX: IR PERCUTANEOUS ART THROMBECTOMY/INFUSION INTRACRANIAL INC DIAG ANGIO: IMG6087

## 2022-04-10 HISTORY — PX: IR INTRAVSC STENT CERV CAROTID W/O EMB-PROT MOD SED INC ANGIO: IMG2304

## 2022-04-10 HISTORY — PX: IR CT HEAD LTD: IMG2386

## 2022-04-10 LAB — CBC WITH DIFFERENTIAL/PLATELET
Abs Immature Granulocytes: 0.05 10*3/uL (ref 0.00–0.07)
Basophils Absolute: 0 10*3/uL (ref 0.0–0.1)
Basophils Relative: 0 %
Eosinophils Absolute: 0 10*3/uL (ref 0.0–0.5)
Eosinophils Relative: 0 %
HCT: 31.7 % — ABNORMAL LOW (ref 36.0–46.0)
Hemoglobin: 11.5 g/dL — ABNORMAL LOW (ref 12.0–15.0)
Immature Granulocytes: 1 %
Lymphocytes Relative: 9 %
Lymphs Abs: 0.8 10*3/uL (ref 0.7–4.0)
MCH: 33.8 pg (ref 26.0–34.0)
MCHC: 36.3 g/dL — ABNORMAL HIGH (ref 30.0–36.0)
MCV: 93.2 fL (ref 80.0–100.0)
Monocytes Absolute: 0.1 10*3/uL (ref 0.1–1.0)
Monocytes Relative: 1 %
Neutro Abs: 8.1 10*3/uL — ABNORMAL HIGH (ref 1.7–7.7)
Neutrophils Relative %: 89 %
Platelets: 247 10*3/uL (ref 150–400)
RBC: 3.4 MIL/uL — ABNORMAL LOW (ref 3.87–5.11)
RDW: 11.8 % (ref 11.5–15.5)
WBC: 9.1 10*3/uL (ref 4.0–10.5)
nRBC: 0 % (ref 0.0–0.2)

## 2022-04-10 LAB — RAPID URINE DRUG SCREEN, HOSP PERFORMED
Amphetamines: NOT DETECTED
Barbiturates: NOT DETECTED
Benzodiazepines: NOT DETECTED
Cocaine: NOT DETECTED
Opiates: NOT DETECTED
Tetrahydrocannabinol: POSITIVE — AB

## 2022-04-10 LAB — MRSA NEXT GEN BY PCR, NASAL: MRSA by PCR Next Gen: NOT DETECTED

## 2022-04-10 LAB — POTASSIUM: Potassium: 3.8 mmol/L (ref 3.5–5.1)

## 2022-04-10 LAB — RESP PANEL BY RT-PCR (RSV, FLU A&B, COVID)  RVPGX2
Influenza A by PCR: NEGATIVE
Influenza B by PCR: NEGATIVE
Resp Syncytial Virus by PCR: NEGATIVE
SARS Coronavirus 2 by RT PCR: NEGATIVE

## 2022-04-10 LAB — LIPID PANEL
Cholesterol: 132 mg/dL (ref 0–200)
HDL: 33 mg/dL — ABNORMAL LOW (ref >40)
LDL Cholesterol: 81 mg/dL (ref 0–99)
Total CHOL/HDL Ratio: 4 RATIO
Triglycerides: 90 mg/dL (ref <150)
VLDL: 18 mg/dL (ref 0–40)

## 2022-04-10 LAB — HIV ANTIBODY (ROUTINE TESTING W REFLEX): HIV Screen 4th Generation wRfx: NONREACTIVE

## 2022-04-10 LAB — BASIC METABOLIC PANEL
Anion gap: 10 (ref 5–15)
BUN: 11 mg/dL (ref 6–20)
CO2: 24 mmol/L (ref 22–32)
Calcium: 8.2 mg/dL — ABNORMAL LOW (ref 8.9–10.3)
Chloride: 104 mmol/L (ref 98–111)
Creatinine, Ser: 0.87 mg/dL (ref 0.44–1.00)
GFR, Estimated: >60 mL/min (ref >60)
Glucose, Bld: 173 mg/dL — ABNORMAL HIGH (ref 70–99)
Potassium: 3 mmol/L — ABNORMAL LOW (ref 3.5–5.1)
Sodium: 138 mmol/L (ref 135–145)

## 2022-04-10 LAB — ETHANOL: Alcohol, Ethyl (B): <10 mg/dL (ref <10)

## 2022-04-10 MED ORDER — ACETAMINOPHEN 650 MG RE SUPP: 650.0000 mg | RECTAL | Status: DC | PRN

## 2022-04-10 MED ORDER — SODIUM CHLORIDE 0.9 % IV SOLN: 250.0000 mL | INTRAVENOUS | Status: DC

## 2022-04-10 MED ORDER — CLEVIDIPINE BUTYRATE 0.5 MG/ML IV EMUL: 0.0000 mg/h | INTRAVENOUS | Status: DC

## 2022-04-10 MED ORDER — TICAGRELOR 90 MG PO TABS
90.0000 mg | ORAL_TABLET | Freq: Two times a day (BID) | ORAL | Status: DC
  Administered 2022-04-11 – 2022-04-12 (×2): 90 mg via ORAL
  Filled 2022-04-10 (×2): qty 1

## 2022-04-10 MED ORDER — FENTANYL CITRATE PF 50 MCG/ML IJ SOSY
50.0000 ug | PREFILLED_SYRINGE | INTRAMUSCULAR | Status: DC | PRN
  Administered 2022-04-10 – 2022-04-11 (×8): 50 ug via INTRAVENOUS
  Filled 2022-04-10 (×8): qty 1

## 2022-04-10 MED ORDER — PROPOFOL 500 MG/50ML IV EMUL
INTRAVENOUS | Status: DC | PRN
  Administered 2022-04-10: 50 ug/kg/min via INTRAVENOUS

## 2022-04-10 MED ORDER — ASPIRIN 81 MG PO CHEW
81.0000 mg | CHEWABLE_TABLET | Freq: Every day | ORAL | Status: DC
  Administered 2022-04-12: 81 mg via ORAL
  Filled 2022-04-10: qty 1

## 2022-04-10 MED ORDER — SODIUM CHLORIDE 0.9 % IV SOLN: INTRAVENOUS | Status: DC

## 2022-04-10 MED ORDER — POTASSIUM CHLORIDE 10 MEQ/100ML IV SOLN
10.0000 meq | INTRAVENOUS | Status: AC
  Administered 2022-04-10 (×6): 10 meq via INTRAVENOUS
  Filled 2022-04-10 (×6): qty 100

## 2022-04-10 MED ORDER — PHENYLEPHRINE HCL-NACL 20-0.9 MG/250ML-% IV SOLN
25.0000 ug/min | INTRAVENOUS | Status: DC
  Administered 2022-04-10: 25 ug/min via INTRAVENOUS
  Administered 2022-04-10: 35 ug/min via INTRAVENOUS
  Filled 2022-04-10 (×4): qty 250

## 2022-04-10 MED ORDER — ROSUVASTATIN CALCIUM 20 MG PO TABS
20.0000 mg | ORAL_TABLET | Freq: Every day | ORAL | Status: DC
  Administered 2022-04-10 – 2022-04-12 (×3): 20 mg via ORAL
  Filled 2022-04-10 (×3): qty 1

## 2022-04-10 MED ORDER — ASPIRIN 81 MG PO CHEW
81.0000 mg | CHEWABLE_TABLET | Freq: Every day | ORAL | Status: DC
  Administered 2022-04-10 – 2022-04-11 (×2): 81 mg
  Filled 2022-04-10 (×2): qty 1

## 2022-04-10 MED ORDER — ORAL CARE MOUTH RINSE
15.0000 mL | OROMUCOSAL | Status: DC | PRN
  Administered 2022-04-10: 15 mL via OROMUCOSAL

## 2022-04-10 MED ORDER — TICAGRELOR 90 MG PO TABS
90.0000 mg | ORAL_TABLET | Freq: Two times a day (BID) | ORAL | Status: DC
  Administered 2022-04-10 – 2022-04-11 (×3): 90 mg
  Filled 2022-04-10 (×3): qty 1

## 2022-04-10 MED ORDER — ACETAMINOPHEN 325 MG PO TABS
650.0000 mg | ORAL_TABLET | ORAL | Status: DC | PRN
  Administered 2022-04-11 – 2022-04-12 (×2): 650 mg via ORAL
  Filled 2022-04-10 (×2): qty 2

## 2022-04-10 MED ORDER — PROPOFOL 1000 MG/100ML IV EMUL
5.0000 ug/kg/min | INTRAVENOUS | Status: DC
  Administered 2022-04-10: 55 ug/kg/min via INTRAVENOUS
  Administered 2022-04-10: 60 ug/kg/min via INTRAVENOUS
  Administered 2022-04-10: 50 ug/kg/min via INTRAVENOUS
  Administered 2022-04-10: 60 ug/kg/min via INTRAVENOUS
  Administered 2022-04-10: 30 ug/kg/min via INTRAVENOUS
  Administered 2022-04-11 (×3): 60 ug/kg/min via INTRAVENOUS
  Filled 2022-04-10 (×3): qty 100
  Filled 2022-04-10: qty 200
  Filled 2022-04-10: qty 100
  Filled 2022-04-10: qty 200
  Filled 2022-04-10: qty 100

## 2022-04-10 MED ORDER — ORAL CARE MOUTH RINSE
15.0000 mL | OROMUCOSAL | Status: DC
  Administered 2022-04-10 – 2022-04-11 (×18): 15 mL via OROMUCOSAL

## 2022-04-10 MED ORDER — ACETAMINOPHEN 160 MG/5ML PO SOLN
650.0000 mg | ORAL | Status: DC | PRN
  Administered 2022-04-10 – 2022-04-11 (×2): 650 mg
  Filled 2022-04-10 (×2): qty 20.3

## 2022-04-10 MED ORDER — CHLORHEXIDINE GLUCONATE CLOTH 2 % EX PADS
6.0000 | MEDICATED_PAD | Freq: Every day | CUTANEOUS | Status: DC
  Administered 2022-04-10 – 2022-04-11 (×3): 6 via TOPICAL

## 2022-04-10 NOTE — Progress Notes (Addendum)
eLink Physician-Brief Progress Note Patient Name: Patricia [name] DOB: 01/11/1966 MRN: [id number]   Date of Service  04/10/2022  HPI/Events of Note  57 year old female presented with right she is was left ICA 55% stenosis.  TNK administered.  Underwent IR guided stent placement.  eICU Interventions  Postoperative SBP goal 120-140 for 24 hours. TNK precautions Standard stroke workup including echocardiogram and restratification labs.  No acute intervention is indicated.   + restraints   [id number] - mildly hypotensive 97/65 - > add phenylephrine for SBP goal 120-140  0515 - K 3.0 (PIV only) --> KCL 60 MEQ  Intervention Category Evaluation Type: New Patient Evaluation  Eular Panek 04/10/2022, 1:36 AM

## 2022-04-10 NOTE — Progress Notes (Signed)
  Echocardiogram 2D Echocardiogram has been performed.  Patricia [name] 04/10/2022, 5:49 PM

## 2022-04-10 NOTE — Sedation Documentation (Signed)
Patient transported to [place] ICU with CRNA staff. Roderic Palau RN at the bedside to receive patient report/ handoff. Right groin assessed. Clean, dry and intact. No drainage noted from dressing. Soft to touch, no hematoma noted. Bulky pressure dressing in place with quick-clot. Palpable distal pulses intact bilaterally. Knee immobilizer in place for groin site support/protection. Patient remains intubated overnight. Patient to have head CT at 0350 and [place] on right lower extremity in the AM for pseudoaneurysm of right femoral artery,

## 2022-04-10 NOTE — Progress Notes (Signed)
An USGPIV (ultrasound guided PIV) has been placed for short-term vasopressor infusion. A correctly placed ivWatch must be used when administering Vasopressors. Should this treatment be needed beyond 72 hours, central line access should be obtained.  It will be the responsibility of the bedside nurse to follow best practice to prevent extravasations.   

## 2022-04-10 NOTE — Progress Notes (Signed)
Pt transported from 4N21 to CT and back to [id number] with no complications.

## 2022-04-10 NOTE — Procedures (Signed)
INR.   Status post right common carotid arteriogram.  Right CFA approach.  Findings.  Occluded right middle cerebral artery M1 segment.  Status post complete revascularization of occluded right middle cerebral artery M1 segment with 1 pass with 070 free climb aspiration catheter and proximal flow arrest achieving a TICI 3 revascularization.  Placement of right internal carotid artery proximal stent across a prominent carotid web at the bulb causing a 55 % stenosis. Post CT brain NO ICH.  95F angioseal used for hemostasis at the rt groin puncture site.Distal pulses intact.Manual compression held for 30 min in view of minimal compression held contrast contrast extravasation at the arterial puncture site . No evidence for external subcutaneous hematoma or bruising noted. Patient left intubated due to her medical condition.  Arlean Hopping MD.

## 2022-04-10 NOTE — Progress Notes (Signed)
NAME:  Patricia [name], MRN:  [id number], DOB:  01/03/1966, LOS: 1 ADMISSION DATE:  04/09/2022, CONSULTATION DATE:  04/10/22 REFERRING MD:  Patrecia Pour , CHIEF COMPLAINT:  stroke    History of Present Illness:  57 yo female smoker developed sudden onset of Lt arm numbness.  Code stroke activated for Rt MCA syndrome.  Seen by neurology in ER and treated with thrombolytic.  Found to have occluded Rt MCA M1 and had mechanical thrombectomy and stent to proximal Rt ICA.  Pertinent  Medical History  Asthma, HTN  Significant Hospital Events: Including procedures, antibiotic start and stop dates in addition to other pertinent events   2/10 admit, TNKase, mechanical thrombectomy to Rt M1 and stent to Rt ICA  Interim History / Subjective:  Remains on vent, sedation, pressors.  Objective   Blood pressure 130/80, pulse 64, temperature 97.7 F (36.5 C), temperature source Axillary, resp. rate 14, height 5' 5"$  (1.651 m), weight 80.2 kg, SpO2 100 %.    Vent Mode: PRVC FiO2 (%):  [40 %-60 %] 40 % Set Rate:  [12 bmp-16 bmp] 12 bmp Vt Set:  [510 mL-520 mL] 510 mL PEEP:  [5 cmH20] 5 cmH20 Plateau Pressure:  [13 cmH20] 13 cmH20   Intake/Output Summary (Last 24 hours) at 04/10/2022 0957 Last data filed at 04/10/2022 0900 Gross per 24 hour  Intake 2215.95 ml  Output 250 ml  Net 1965.95 ml   Filed Weights   04/09/22 2100  Weight: 80.2 kg    Examination:  General - sedated Eyes - pupils reactive ENT - ETT in place Cardiac - regular rate/rhythm, no murmur Chest - equal breath sounds b/l, no wheezing or rales Abdomen - soft, non tender, + bowel sounds Extremities - no cyanosis, clubbing, or edema Skin - no rashes Neuro - RASS -3  Resolved Hospital Problem list     Assessment & Plan:   Acute Rt MCA ischemic stroke with M1 occlusion s/p TNKase, mechanical thrombectomy and stent to Rt ICA. - f/u Echo, MRI brain - pressors to keep SBP 120 to 140  Possible Rt femoral artery  pseudoaneurysm. - f/u ultrasound - keep leg straight  Compromised airway. Hx of asthma. - full vent support until neuro status stable - f/u CXR - goal SpO2 > 92% - prn duoneb  Hx of HTN, HLD. - continue crestor - hold outpt HCTZ for now  Hypokalemia. - f/u BMET, Mg  Best Practice (right click and "Reselect all SmartList Selections" daily)   Diet/type: NPO DVT prophylaxis: SCD GI prophylaxis: PPI Lines: N/A Foley:  N/A Code Status:  full code Last date of multidisciplinary goals of care discussion [updated her mother at bedside]  Labs       Latest Ref Rng & Units 04/10/2022    4:24 AM 04/09/2022   10:13 PM 04/09/2022    9:50 PM  CMP  Glucose 70 - 99 mg/dL 173  107  110   BUN 6 - 20 mg/dL 11  16  14   $ Creatinine 0.44 - 1.00 mg/dL 0.87  1.00  1.01   Sodium 135 - 145 mmol/L 138  140  135   Potassium 3.5 - 5.1 mmol/L 3.0  3.1  3.1   Chloride 98 - 111 mmol/L 104  101  101   CO2 22 - 32 mmol/L 24   26   Calcium 8.9 - 10.3 mg/dL 8.2   9.0   Total Protein 6.5 - 8.1 g/dL   6.6   Total Bilirubin 0.3 -  1.2 mg/dL   0.7   Alkaline Phos 38 - 126 U/L   84   AST 15 - 41 U/L   25   ALT 0 - 44 U/L   11        Latest Ref Rng & Units 04/10/2022    4:24 AM 04/09/2022   10:13 PM 04/09/2022    9:50 PM  CBC  WBC 4.0 - 10.5 K/uL 9.1   9.1   Hemoglobin 12.0 - 15.0 g/dL 11.5  11.9  12.1   Hematocrit 36.0 - 46.0 % 31.7  35.0  33.5   Platelets 150 - 400 K/uL 247   226     ABG    Component Value Date/Time   TCO2 29 04/09/2022 2213    CBG (last 3)  Recent Labs    04/09/22 2152  GLUCAP 112*    Critical care time: 43 minutes  Chesley Mires, MD [place] Pager - [phone] or [phone] 04/10/2022, 10:20 AM

## 2022-04-10 NOTE — Progress Notes (Signed)
OT Cancellation Note  Patient Details Name: Patricia [name] MRN: [id number] DOB: 1965/06/21   Cancelled Treatment:    Reason Eval/Treat Not Completed: Medical issues which prohibited therapy (OT to f/u when appropriate.)  Elliot Cousin 04/10/2022, 10:26 AM

## 2022-04-10 NOTE — Progress Notes (Addendum)
STROKE TEAM PROGRESS NOTE   INTERVAL HISTORY No family at the bedside. RN at the bedside. Patient is intubated and on propofol @ 49mg and on Neo @ 55 mcg which was lowered to 35 mcg while at the bedside.  [place] right groin revealed small pseudoaneurysm pressure was held and then redressed by RN   Vitals:   04/10/22 1315 04/10/22 1330 04/10/22 1345 04/10/22 1400  BP: (!) 140/82 131/83 (!) 144/90 133/85  Pulse: 60 60 62 60  Resp: (!) 0 (!) 0 (!) 5 (!) 8  Temp:      TempSrc:      SpO2: 100% 100% 100% 100%  Weight:      Height:       CBC:  Recent Labs  Lab 04/09/22 2150 04/09/22 2213 04/10/22 0424  WBC 9.1  --  9.1  NEUTROABS 5.3  --  8.1*  HGB 12.1 11.9* 11.5*  HCT 33.5* 35.0* 31.7*  MCV 92.5  --  93.2  PLT 226  --  [id number]  Basic Metabolic Panel:  Recent Labs  Lab 04/09/22 2150 04/09/22 2213 04/10/22 0424  NA 135 140 138  K 3.1* 3.1* 3.0*  CL 101 101 104  CO2 26  --  24  GLUCOSE 110* 107* 173*  BUN 14 16 11  $ CREATININE 1.01* 1.00 0.87  CALCIUM 9.0  --  8.2*   Lipid Panel:  Recent Labs  Lab 04/10/22 0424  CHOL 132  TRIG 90  HDL 33*  CHOLHDL 4.0  VLDL 18  LDLCALC 81   HgbA1c:  Recent Labs  Lab 04/09/22 2150  HGBA1C 5.1   Urine Drug Screen: No results for input(s): "LABOPIA", "COCAINSCRNUR", "LABBENZ", "AMPHETMU", "THCU", "LABBARB" in the last 168 hours.  Alcohol Level  Recent Labs  Lab 04/10/22 0424  ETH <10    IMAGING past 24 hours DG CHEST PORT 1 VIEW  Result Date: 04/10/2022 CLINICAL DATA:  Endotracheal tube placement EXAM: PORTABLE CHEST 1 VIEW COMPARISON:  Chest x-ray October 30, 2019 FINDINGS: The endotracheal tube terminates approximately 3.5 cm above the carina. The NG tube side port terminates below the diaphragm in the distal tip is below the field of view. The cardiomediastinal silhouette is unchanged in contour. No focal pulmonary opacity. No pleural effusion or pneumothorax. The visualized upper abdomen is unremarkable. No acute osseous  abnormality. IMPRESSION: 1. Adequate positioning of the endotracheal tube and NG tube. 2. No acute cardiopulmonary abnormality. Electronically Signed   By: MBeryle FlockM.D.   On: 04/10/2022 14:04   VAS [place]GROIN PSEUDOANEURYSM  Result Date: 04/10/2022  ARTERIAL PSEUDOANEURYSM  Patient Name:  CSMYA MCGARRIGLE Date of Exam:   04/10/2022 Medical Rec #: [id number]       Accession #:    [id number] of Birth: 3Mar 31, 1967       Patient Gender: F Patient Age:   89years [facility]      [facility] [place]GROIN PSEUDOANEURYSM Referring Phys: SLuanne Bras--------------------------------------------------------------------------------  Exam: Right groin Indications: Patient complains of oozing from stick site. 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 Comparison Study: No prior study Performing Technologist: CSharion DoveRVS  Examination Guidelines: A complete evaluation includes B-mode imaging, spectral Doppler, color Doppler, and power Doppler as needed of all accessible portions of each vessel. Bilateral testing is considered an integral part  of a complete examination. Limited examinations for reoccurring indications may be performed as noted.  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 The neck measures approximately 0.4 cm wide and 0.4 cm long.  Summary: Partially thrombosed pseudoaneurysm arising off the proximal SFA noted in the right proximal thigh/groin area.    --------------------------------------------------------------------------------    Preliminary    DG Abd Portable 1V  Result Date: 04/10/2022 CLINICAL DATA:  OG tube placement. EXAM: PORTABLE ABDOMEN - 1 VIEW  COMPARISON:  None Available. FINDINGS: OG tube tip is in the mid stomach. Proximal side port is at or just below the level of the GE junction. Tube could be advanced another 3-4 cm to ensure side port is below the GE junction, as clinically warranted visualized abdomen demonstrates nonspecific gas pattern. Contrast excretion noted from both kidneys compatible with contrast infused studies from 04/09/2022. IMPRESSION: OG tube tip is in the mid stomach. Proximal side port is at or just below the level of the GE junction. See above. Electronically Signed   By: Misty Stanley M.D.   On: 04/10/2022 07:34   CT HEAD WO CONTRAST (5MM)  Result Date: 04/10/2022 CLINICAL DATA:  Stroke follow-up. EXAM: CT HEAD WITHOUT CONTRAST TECHNIQUE: Contiguous axial images were obtained from the base of the skull through the vertex without intravenous contrast. RADIATION DOSE REDUCTION: This exam was performed according to the departmental dose-optimization program which includes automated exposure control, adjustment of the mA and/or kV according to patient size and/or use of iterative reconstruction technique. COMPARISON:  Head CT from yesterday FINDINGS: Brain: No evidence of acute infarction, hemorrhage, hydrocephalus, extra-axial collection or mass lesion/mass effect. Vascular: Prominent density of right M1 segment but only at the level of streak artifact on coronal reformats. Skull: Normal. Negative for fracture or focal lesion. Sinuses/Orbits: Negative IMPRESSION: Negative for hemorrhage or visible infarct. Electronically Signed   By: Jorje Guild M.D.   On: 04/10/2022 04:02   CT ANGIO HEAD NECK W WO CM (CODE STROKE)  Result Date: 04/09/2022 CLINICAL DATA:  Initial evaluation for neuro deficit, stroke. EXAM: CT ANGIOGRAPHY HEAD AND NECK TECHNIQUE: Multidetector CT imaging of the head and neck was performed using the standard protocol during bolus administration of intravenous contrast. Multiplanar CT image reconstructions  and MIPs were obtained to evaluate the vascular anatomy. Carotid stenosis measurements (when applicable) are obtained utilizing NASCET criteria, using the distal internal carotid diameter as the denominator. RADIATION DOSE REDUCTION: This exam was performed according to the departmental dose-optimization program which includes automated exposure control, adjustment of the mA and/or kV according to patient size and/or use of iterative reconstruction technique. CONTRAST:  57m OMNIPAQUE IOHEXOL 350 MG/ML SOLN COMPARISON:  Prior CT from earlier the same day. FINDINGS: CTA NECK FINDINGS Aortic arch: Visualized aortic arch normal caliber with standard branch pattern. No stenosis about the origin of the great vessels. Right carotid system: Right common and internal carotid arteries are patent without dissection. Transverse filling defect involving the proximal cervical right ICA just distal to the bifurcation most characteristic of a carotid web (series 9, image 52). Associated short-segment stenosis of up to 55% by NASCET criteria. Left carotid system: Left common and internal carotid arteries are patent without stenosis or dissection. Vertebral arteries: Both vertebral arteries arise from the subclavian arteries. No proximal subclavian artery stenosis. Vertebral arteries are diminutive but patent without stenosis or dissection. Skeleton: No discrete  or worrisome osseous lesions. Mild-to-moderate spondylosis noted at C5-6 and C6-7. Other neck: No other acute soft tissue abnormality within the neck. 1.8 cm heterogeneous left thyroid noted This has been evaluated on previous imaging, most recently with prior ultrasound from 07/26/2017. (Ref: J Am Coll Radiol. 2015 Feb;12(2): 143-50). Upper chest:  Visualized upper chest demonstrates no acute finding. Review of the MIP images confirms the above findings CTA HEAD FINDINGS Anterior circulation: Both internal carotid arteries are patent to the termini without stenosis. A1  segments patent bilaterally. Normal anterior communicating artery complex. Anterior cerebral arteries patent bilaterally. Left M1 segment widely patent. No proximal left MCA branch occlusion or high-grade stenosis. Distal left MCA branches well perfused. On the right, there is acute occlusion of the mid and distal right M1 segment, corresponding with hyperdense vessel on prior noncontrast CT (series 7, image 98). Attenuated flow within the right MCA territory distally, likely collateral nature. Posterior circulation: V4 segments diminutive but widely patent without stenosis. Both PICA patent. Basilar fenestrated and markedly diminutive but patent without stenosis. Superior cerebral arteries patent bilaterally. Fetal type origin of the PCAs, both of which are widely patent to their distal aspects. Venous sinuses: Patent allowing for timing the contrast bolus. Anatomic variants: Fetal type origin of the PCAs with overall diminutive vertebrobasilar system. No aneurysm. Review of the MIP images confirms the above findings IMPRESSION: 1. Positive CTA for emergent large vessel occlusion, with acute occlusion of the mid and distal right M1 segment. 2. Transverse filling defect involving the proximal cervical right ICA just distal to the bifurcation, most characteristic of a carotid web. Associated short-segment stenosis of up to 55% by NASCET criteria. 3. Fetal type origin of the PCAs with overall diminutive vertebrobasilar system. These results were communicated to Dr. Curly Shores at 10:42 pm on 04/09/2022 by text page via the [facility] messaging system. Electronically Signed   By: Jeannine Boga M.D.   On: 04/09/2022 22:55   CT HEAD CODE STROKE WO CONTRAST  Result Date: 04/09/2022 CLINICAL DATA:  Code stroke. Initial evaluation for neuro deficit, stroke. EXAM: CT HEAD WITHOUT CONTRAST TECHNIQUE: Contiguous axial images were obtained from the base of the skull through the vertex without intravenous contrast. RADIATION  DOSE REDUCTION: This exam was performed according to the departmental dose-optimization program which includes automated exposure control, adjustment of the mA and/or kV according to patient size and/or use of iterative reconstruction technique. COMPARISON:  None Available. FINDINGS: Brain: Cerebral volume within normal limits. No acute intracranial hemorrhage. There is subtle loss of gray-white matter differentiation involving the right insula as well as the right caudate and lentiform nuclei, concerning for early acute right MCA territory infarct. Gray-white matter differentiation otherwise grossly maintained. No mass lesion or midline shift. No hydrocephalus or extra-axial fluid collection. Tiny lipoma noted near the quadrigeminal plate cistern. Vascular: Dense right M1 segment, concerning for thrombus in large vessel occlusion. Skull: Scalp soft tissues and calvarium demonstrate no acute finding. Sinuses/Orbits: Right gaze preference noted. Paranasal sinuses are largely clear. No mastoid effusion. Other: None. ASPECTS [facility] Stroke Program Early CT Score) - Ganglionic level infarction (caudate, lentiform nuclei, internal capsule, insula, M1-M3 cortex): 4 - Supraganglionic infarction (M4-M6 cortex): 3 Total score (0-10 with 10 being normal): 7 IMPRESSION: 1. Dense right M1 segment, concerning for thrombus/large vessel occlusion. 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 No intracranial hemorrhage. 2. ASPECTS is 7. These results were communicated to Dr. Curly Shores at 10:11 pm on 04/09/2022 by text page via the [facility] messaging system. Electronically  Signed   By: Jeannine Boga M.D.   On: 04/09/2022 22:16    PHYSICAL EXAM  Temp:  [96.2 F (35.7 C)-97.8 F (36.6 C)] 97.8 F (36.6 C) (02/11 1200) Pulse Rate:  [60-116] 60 (02/11 1400) Resp:  [0-25] 8 (02/11 1400) BP: (94-158)/(65-100) 133/85 (02/11 1400) SpO2:  [99 %-100 %] 100 % (02/11 1400) FiO2 (%):  [40 %-60 %] 40 % (02/11  1101) Weight:  [80.2 kg] 80.2 kg (02/10 2100)  General - Well nourished, well developed, in no apparent distress. Cardiovascular - Regular rhythm and rate.  Mental Status -  Patient is intubated and sedated. She does not open eyes, or follow commands   Cranial Nerves II - XII - II - Visual field no blink to threat  III, IV, VI - Extraocular movements intact. V - Facial sensation intact bilaterally. VII -unable to assess  VIII - Hearing & vestibular intact bilaterally. X - Palate elevates symmetrically. XI - Chin turning & shoulder shrug intact bilaterally. XII - Tongue protrusion intact.  Motor Strength - right side is purposeful and stronger than the left side, can lift left arm when gravity is eliminated does have horizontal movement of the left lower extremity  Motor Tone - Muscle tone was assessed at the neck and appendages and was normal.  Sensory - responds to noxious stimuli   Coordination - Unable to assess  Gait and Station - deferred.  ASSESSMENT/PLAN Ms. NATHA HANCHEY is a 57 y.o. female with history of  smoking, HTN and asthma who presents to [facility] ED for evaluation of Right MCA syndrome with left side weakness and numbness. 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 most characteristic of a carotid web. Associated short-segment stenosis of up to 55% by NASCET criteria. Fetal type origin of the PCAs with overall diminutive vertebrobasilar system IR s/p right MCA TICI 3 revascularization and right ICA stent. Post IR CT  No ICH  MRI / MRA pending  2D Echo ordered  LDL 81 HgbA1c 5.1 UDS +THC VTE prophylaxis - SCD's  No antithrombotic prior to admission, now on ASA and brilinta given right ICA stenting.   Therapy recommendations:  pending  Disposition:  pending   Right groin pseudoaneurysm [place] right groin - small pseudoaneurysm  S/p manual compression  Bed flat  Repeat US right groin in am  Acute Respiratory Failure due to inability to protect airway  Vent management per CCM  Goal SPO2 > 92% Keep intubated today for right groin pseudoaneurysm, ? Aspiration with dysphagia Wean off vent in am if able after [place] right groin  Hypertension Home meds:  none  On the low end On neo gtt to maintain BP goal  BP goal 120-140 within 24h post IR Long-term BP goal normotensive  Hyperlipidemia Home meds:  none LDL 81, goal < 70 Add Crestor 20 mg   Continue statin at discharge  Dysphagia  OG tube  On ASA and brilinta via OG Consider starting TF in the next 24 hrs   Tobacco abuse Current smoker  Smoking cessation counseling will be provided  Other Stroke Risk Factors THC abuse, UDS positive for THC, cessation education will be provided.  Other Active Problems Hypokalemia, K 3.0 replacing an check in the am    Hospital day # [place] DNP, ACNPC-AG  [facility]  ATTENDING NOTE: I reviewed above note and agree with the assessment and plan. Pt was seen and examined.   57 year old female with history of hypertension and smoker admitted for left arm weakness numbness, left facial droop, left hemianopia and right gaze.  CT showed right M1 hyperdense sign, and early ischemic changes right MCA.  Status post TNK.  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  Status post IR with TICI3 reperfusion and right ICA stenting.  Post IR CT showed no hemorrhage.  MRI and MRA pending.  2D echo pending.  LDL 81, A1c 5.1, UDS positive for  THC.  Creatinine 0.87 and potassium 3.0.  Right groin ultrasound showed small femoral artery pseudoaneurysm.  On exam, patient mom at bedside.  Patient intubated on sedation, eyes closed but halfway open on voice, not following commands. With forced eye opening, eyes in right gaze preference position with nystagmus on right gaze, not blinking to visual threat, doll's eyes not cross midline, not tracking, PERRL. Corneal reflex absent, gag and cough present. Breathing over the vent.  Facial symmetry not able to test due to ET tube.  Tongue protrusion not cooperative. On pain stimulation, with no all extremities but right more than left. Sensation, coordination and gait not tested.  Etiology for patient right MCA stroke likely due to right carotid web which has been treated with right ICA stenting.  Currently on aspirin and Brilinta DAPT given stenting.  Also on statin.  Right femoral artery pseudoaneurysm is small, status post manual compression and laying flat, will repeat ultrasound in AM.  Remain intubated for today given pseudoaneurysm, dysphagia and questionable aspiration.  If pseudoaneurysm stable in a.m., plan for wean off ventilation if able.  BP goal 1 20-1 40 within 24 hours post IR. I had long discussion with mom at bedside, updated pt current condition, treatment plan and potential prognosis, and answered all the questions.  She expressed understanding and appreciation.  I also discussed with CCM Dr. Halford Chessman.  For detailed assessment and plan, please refer to above/below as I have made changes wherever appropriate.   Rosalin Hawking, MD PhD Stroke Neurology 04/10/2022 5:51 PM  This patient is critically ill due to right MCA stroke, status post TNK and mechanical thrombectomy and ICA stenting, groin pseudoaneurysm and at significant risk of neurological worsening, death form recurrent stroke, hemorrhagic transformation, pseudoaneurysm enlargement. This patient's care requires constant monitoring of  vital signs, hemodynamics, respiratory and cardiac monitoring, review of multiple databases, neurological assessment, discussion with family, other specialists and medical decision making of high complexity. I spent 45 minutes of neurocritical care time in the care of this patient.    To contact Stroke Continuity provider, please refer to [email or web address] After hours, contact General Neurology

## 2022-04-10 NOTE — Progress Notes (Signed)
Small pseudoaneurysm noted on [place], partially thrombosed. Pressure dressing applied. D/w Dr. Estanislado Pandy, Will recheck [place] tomorrow am for follow up.  Ascencion Dike PA-C Interventional Radiology 04/10/2022 11:59 AM

## 2022-04-10 NOTE — Progress Notes (Signed)
RT unable to obtain sample for an ABG.

## 2022-04-10 NOTE — Consult Note (Signed)
NAME:  Patricia [name], MRN:  [id number], DOB:  1965-10-05, LOS: 1 ADMISSION DATE:  04/09/2022, CONSULTATION DATE:  04/10/22 REFERRING MD:  Patrecia Pour , CHIEF COMPLAINT:  stroke    History of Present Illness:  57 yo woman with hx asthma, HTN, brought by EMS as code stroke.   L arm suddenly numb at 2110.  L side hemiparesis, L facial droop, r gaze.  R MCA syndrome.  Apparently was also experiencing some intermittent chest pain.   Thrombolytic given 2202 Required ativan prior to imaging.   Went to IR: Revascularization of occluded R MCA M1 R ICA proximal stent.   Per notes, Patient to have head CT at 0350 and [place] on right lower extremity in the AM for pseudoaneurysm of right femoral artery,   Pertinent  Medical History  Asthma HTN Cigarette smoker   On amlodipine for awhile, Recently added lisinopril and nifedipine.  Per list on HCTZ  Albuterol    Significant Hospital Events: Including procedures, antibiotic start and stop dates in addition to other pertinent events     Interim History / Subjective:    Objective   Blood pressure (!) 137/91, pulse 94, resp. rate 18, height 5' 5"$  (1.651 m), weight 80.2 kg, SpO2 100 %.        Intake/Output Summary (Last 24 hours) at 04/10/2022 0119 Last data filed at 04/10/2022 0015 Gross per 24 hour  Intake 1000 ml  Output --  Net 1000 ml   Filed Weights   04/09/22 2100  Weight: 80.2 kg    Examination: General: NAD, tremor intermittently  HENT: NCAT, perrl  Lungs: ctab  Cardiovascular: sinus tach  Abdomen: nt, nd, nbs  Extremities: normal  Neuro: non responsive, sedated  GU:   Resolved Hospital Problem list     Assessment & Plan:  S/p stroke - care per neuro and neuro IR.  S/p stent in carotid, revascularization  Contrast extravasation at arterial puncture site.   Goal bp 0000000 systolic Cont mech vent for now.  Repeat CT this am early.  Likely extubated in am.  On propofol now.   Hx HTN: hold meds  Asthma hx: duonebs  prn ordered.   Best Practice (right click and "Reselect all SmartList Selections" daily)   Diet/type: NPO DVT prophylaxis: other GI prophylaxis: N/A Lines: N/A Foley:  N/A Code Status:  full code Last date of multidisciplinary goals of care discussion []$   Labs   CBC: Recent Labs  Lab 04/09/22 2150 04/09/22 2213  WBC 9.1  --   NEUTROABS 5.3  --   HGB 12.1 11.9*  HCT 33.5* 35.0*  MCV 92.5  --   PLT 226  --     Basic Metabolic Panel: Recent Labs  Lab 04/09/22 2150 04/09/22 2213  NA 135 140  K 3.1* 3.1*  CL 101 101  CO2 26  --   GLUCOSE 110* 107*  BUN 14 16  CREATININE 1.01* 1.00  CALCIUM 9.0  --    GFR: Estimated Creatinine Clearance: 67.3 mL/min (by C-G formula based on SCr of 1 mg/dL). Recent Labs  Lab 04/09/22 2150  WBC 9.1    Liver Function Tests: Recent Labs  Lab 04/09/22 2150  AST 25  ALT 11  ALKPHOS 84  BILITOT 0.7  PROT 6.6  ALBUMIN 3.8   No results for input(s): "LIPASE", "AMYLASE" in the last 168 hours. No results for input(s): "AMMONIA" in the last 168 hours.  ABG    Component Value Date/Time   TCO2 29 04/09/2022  2213     Coagulation Profile: Recent Labs  Lab 04/09/22 2150  INR 0.9    Cardiac Enzymes: No results for input(s): "CKTOTAL", "CKMB", "CKMBINDEX", "TROPONINI" in the last 168 hours.  HbA1C: Hgb A1c MFr Bld  Date/Time Value Ref Range Status  04/09/2022 09:50 PM 5.1 4.8 - 5.6 % Final    Comment:    (NOTE) Pre diabetes:          5.7%-6.4%  Diabetes:              >6.4%  Glycemic control for   <7.0% adults with diabetes     CBG: Recent Labs  Lab 04/09/22 2152  GLUCAP 112*    Review of Systems:   Unable to assess  Past Medical History:  She,  has a past medical history of Asthma, Bronchitis, and Hypertension.   Surgical History:   Past Surgical History:  Procedure Laterality Date   CHOLECYSTECTOMY     TUBAL LIGATION       Social History:   reports that she has been smoking cigarettes. She has  never used smokeless tobacco. She reports current alcohol use. She reports current drug use. Drug: Marijuana.   Family History:  Her family history is not on file.   Allergies Allergies  Allergen Reactions   Tramadol Rash     Home Medications  Prior to Admission medications   Medication Sig Start Date End Date Taking? Authorizing Provider  albuterol (PROVENTIL HFA;VENTOLIN HFA) 108 (90 Base) MCG/ACT inhaler Inhale 2 puffs into the lungs every 6 (six) hours as needed for wheezing or shortness of breath. 07/08/16   Delman Kitten, MD  cephALEXin (KEFLEX) 500 MG capsule Take 1 capsule (500 mg total) by mouth 4 (four) times daily. 09/29/21   Johnn Hai, PA-C  hydrochlorothiazide (MICROZIDE) 12.5 MG capsule Take 1 capsule (12.5 mg total) by mouth daily. 07/22/16   Merlyn Lot, MD     Critical care time: 35 min

## 2022-04-10 NOTE — Progress Notes (Signed)
0130: Spoke with Dr. Curly Shores at bedside. MD aware patient hasn't voided in a while, and currently no foley is placed. MD aware patient is post TNK. Verbal orders to place a foley catheter if patient is retaining urine. Per verbal order from Dr. Curly Shores, foley only to be placed if bladder scan shows 600 mL or more.  0230: RN notified E-link that patient's blood pressure is under goal of SBP 120-140. RN has titrated down on propofol.  0233: RN initially saw brilinta per tube ordered for this shift, scheduled for 0200. No longer scheduled for 0200. RN called pharmacy. Pharmacy said that loading dose has been given and standard is for next dose to be given approximately 12 hours later. Dr. Curly Shores notified in person.

## 2022-04-10 NOTE — Consult Note (Signed)
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  Patient noticed to move all fours when off sedation and following simple commands. Pupils 3 mm round regular sluggish.   No gross facial asymmetry.  Right groin pressure bandage removed.  No evidence of ecchymosis, or palpable swelling at the right groin puncture site.  Distal pulses all present bilaterally. Plan. 1.  Will obtain ultrasound of the right groin today to rule out the possibility of a  pseudoaneurysm.   2. patient to continue  on aspirin 81 mg/day and Brilinta 90 mg twice daily. 2.  MRI of the brain pending.  Arlean Hopping MD

## 2022-04-10 NOTE — Progress Notes (Signed)
Vasular ultrasound at bedside to evaluate pt for pseudoaneurysm at femoral puncture site.  RN noticed IR dressing had been taken down for this imaging. RN, Agricultural consultant and Neurology at bedside with IR MD on the phone. Instructed to hold pressure for 20 minutes, redress with telfa, gauze, tagaderm, gauze, 10cc syringe, kerlix, and metapore tape in criss cross position.

## 2022-04-10 NOTE — Progress Notes (Signed)
VASCULAR LAB    Ultrasound of right groin has been performed.  See CV proc for preliminary results.   Gave verbal results to Dr. Jorene Guest, [facility], RVT 04/10/2022, 10:26 AM

## 2022-04-10 NOTE — Transfer of Care (Signed)
Immediate Anesthesia Transfer of Care Note  Patient: Patricia [name]  Procedure(s) Performed: RADIOLOGY WITH ANESTHESIA  Patient Location: ICU  Anesthesia Type:General  Level of Consciousness: Patient remains intubated per anesthesia plan  Airway & Oxygen Therapy: Patient remains intubated per anesthesia plan and Patient placed on Ventilator (see vital sign flow sheet for setting)  Post-op Assessment: Report given to RN and Post -op Vital signs reviewed and stable  Post vital signs: Reviewed and stable  Last Vitals:  Vitals Value Taken Time  BP 129/99 04/10/22 0115  Temp    Pulse 106 04/10/22 0129  Resp 12 04/10/22 0129  SpO2 100 % 04/10/22 0129  Vitals shown include unvalidated device data.  Last Pain: There were no vitals filed for this visit.       Complications: No notable events documented.

## 2022-04-10 NOTE — Anesthesia Postprocedure Evaluation (Signed)
Anesthesia Post Note  Patient: Patricia [name]  Procedure(s) Performed: RADIOLOGY WITH ANESTHESIA     Patient location during evaluation: ICU Anesthesia Type: General Level of consciousness: sedated Pain management: pain level controlled Vital Signs Assessment: post-procedure vital signs reviewed and stable Respiratory status: patient remains intubated per anesthesia plan Cardiovascular status: stable Postop Assessment: no apparent nausea or vomiting Anesthetic complications: no   No notable events documented.  Last Vitals:  Vitals:   04/10/22 0722 04/10/22 0739  BP: 128/67   Pulse:  74  Resp:  12  Temp:    SpO2:  100%    Last Pain:  Vitals:   04/10/22 0630  TempSrc: Axillary                 Patricia [name] P Askari Kinley

## 2022-04-10 NOTE — Sedation Documentation (Signed)
8 Fr Angioseal deployed right groin. Holding manual pressure x 15 min

## 2022-04-10 NOTE — Plan of Care (Signed)
  Problem: Education: Goal: Knowledge of disease or condition will improve Outcome: Progressing Goal: Knowledge of patient specific risk factors will improve Elta [place] N/A or DELETE if not current risk factor) Outcome: Progressing   Problem: Cardiovascular: Goal: Ability to achieve and maintain adequate cardiovascular perfusion will improve Outcome: Progressing

## 2022-04-10 NOTE — Progress Notes (Signed)
eLink Physician-Brief Progress Note Patient Name: Patricia [name] DOB: 11-Dec-1965 MRN: [id number]   Date of Service  04/10/2022  HPI/Events of Note  Received clarification regarding fluids that was ordered to discontinue this morning that remains infusing. Patient oliguric with only 250 cc during dayshift.  She remains NPO as needs to remains flat in bed due to right groin pseudoaneurysm.  eICU Interventions  Will continue NS at 50 cc/hr for now until feeding can be started. Repeat K this evening as it was this morning and was replaced with K 60 meqs Discussed with bedside RN.     Intervention Category Intermediate Interventions: Electrolyte abnormality - evaluation and management;Oliguria - evaluation and management;Other:  Judd Lien 04/10/2022, 7:59 PM

## 2022-04-10 NOTE — Progress Notes (Signed)
PT Cancellation Note  Patient Details Name: Patricia [name] MRN: [id number] DOB: 09/25/1965   Cancelled Treatment:    Reason Eval/Treat Not Completed: Patient not medically ready. Pt remains on strict bed rest and intubated. Acute PT to return as able/as appropriate to complete PT eval.  Patricia [name], PT, DPT Acute Rehabilitation Services Secure chat preferred Office #: [phone]    Berline Lopes 04/10/2022, 9:56 AM

## 2022-04-10 NOTE — Progress Notes (Signed)
Ultrasound of Rt groin showed small pseudoaneurysm.  Needs to remain flat.  Concern about risk of aspiration if we try to extubate her at this time.  Also uncertain whether she would be able to swallow pills while remaining flat.  Decision to keep on vent for now until pseudoaneurysm can be reassessed in next 24 hours.  Chesley Mires, MD [place] Pager - [phone] 04/10/2022, 4:51 PM

## 2022-04-10 NOTE — Progress Notes (Signed)
SLP Cancellation Note  Patient Details Name: Patricia [name] MRN: [id number] DOB: 1965/12/22   Cancelled treatment:       Reason Eval/Treat Not Completed: Patient not medically ready;Other (comment) (Patient is intubated, SLP will follow for readiness)   Sonia Baller, MA, CCC-SLP Speech Therapy

## 2022-04-11 ENCOUNTER — Inpatient Hospital Stay (HOSPITAL_COMMUNITY): Payer: 59

## 2022-04-11 ENCOUNTER — Encounter (HOSPITAL_COMMUNITY): Payer: Self-pay | Admitting: Radiology

## 2022-04-11 DIAGNOSIS — I724 Aneurysm of artery of lower extremity: Secondary | ICD-10-CM | POA: Diagnosis not present

## 2022-04-11 LAB — BASIC METABOLIC PANEL
Anion gap: 7 (ref 5–15)
BUN: 9 mg/dL (ref 6–20)
CO2: 22 mmol/L (ref 22–32)
Calcium: 7.9 mg/dL — ABNORMAL LOW (ref 8.9–10.3)
Chloride: 110 mmol/L (ref 98–111)
Creatinine, Ser: 0.94 mg/dL (ref 0.44–1.00)
GFR, Estimated: >60 mL/min (ref >60)
Glucose, Bld: 104 mg/dL — ABNORMAL HIGH (ref 70–99)
Potassium: 3.3 mmol/L — ABNORMAL LOW (ref 3.5–5.1)
Sodium: 139 mmol/L (ref 135–145)

## 2022-04-11 LAB — CBC
HCT: 28.3 % — ABNORMAL LOW (ref 36.0–46.0)
Hemoglobin: 9.7 g/dL — ABNORMAL LOW (ref 12.0–15.0)
MCH: 34.2 pg — ABNORMAL HIGH (ref 26.0–34.0)
MCHC: 34.3 g/dL (ref 30.0–36.0)
MCV: 99.6 fL (ref 80.0–100.0)
Platelets: 180 10*3/uL (ref 150–400)
RBC: 2.84 MIL/uL — ABNORMAL LOW (ref 3.87–5.11)
RDW: 12.2 % (ref 11.5–15.5)
WBC: 9.7 10*3/uL (ref 4.0–10.5)
nRBC: 0 % (ref 0.0–0.2)

## 2022-04-11 LAB — ECHOCARDIOGRAM COMPLETE
Height: 65 in
S' Lateral: 2.4 cm
Weight: 2828.94 oz

## 2022-04-11 LAB — TRIGLYCERIDES: Triglycerides: 154 mg/dL — ABNORMAL HIGH (ref <150)

## 2022-04-11 LAB — MAGNESIUM: Magnesium: 1.9 mg/dL (ref 1.7–2.4)

## 2022-04-11 MED ORDER — POTASSIUM CHLORIDE 20 MEQ PO PACK
40.0000 meq | PACK | Freq: Once | ORAL | Status: AC
  Administered 2022-04-11: 40 meq
  Filled 2022-04-11: qty 2

## 2022-04-11 MED ORDER — PANTOPRAZOLE SODIUM 40 MG PO TBEC
40.0000 mg | DELAYED_RELEASE_TABLET | Freq: Every day | ORAL | Status: DC
  Administered 2022-04-11: 40 mg via ORAL
  Filled 2022-04-11: qty 1

## 2022-04-11 NOTE — Progress Notes (Signed)
Pt transported from [id number] to MRI and back to [id number] with no complications.

## 2022-04-11 NOTE — Progress Notes (Signed)
Referring Physician(s): Lesleigh Noe, MD  Supervising Physician: Luanne Bras  Patient Status:  Patricia [name] - In-pt  Chief Complaint:  1 day s/p mechanical thrombectomy for R MCA occlusion and placement of stent to proximal R ICA.   Subjective:  Pt resting in bed. She is intubated but alert. When assessing R CFA puncture site, pt waved hand away. When asked if it hurts, she shakes her head yes. Son states that pt seems to be doing well, but complains of right jaw pain.   Allergies: Tramadol  Medications: Prior to Admission medications   Medication Sig Start Date End Date Taking? Authorizing Provider  albuterol (PROVENTIL HFA;VENTOLIN HFA) 108 (90 Base) MCG/ACT inhaler Inhale 2 puffs into the lungs every 6 (six) hours as needed for wheezing or shortness of breath. 07/08/16   Delman Kitten, MD  amLODipine (NORVASC) 5 MG tablet Take 5 mg by mouth daily. 02/15/22   [provider]  cephALEXin (KEFLEX) 500 MG capsule Take 1 capsule (500 mg total) by mouth 4 (four) times daily. 09/29/21   Johnn Hai, PA-C  citalopram (CELEXA) 20 MG tablet Take 20 mg by mouth at bedtime. 03/17/22   [provider]  hydrochlorothiazide (HYDRODIURIL) 25 MG tablet Take 25 mg by mouth daily. 03/17/22   [provider]  NIFEdipine (ADALAT CC) 30 MG 24 hr tablet Take 30 mg by mouth daily. 03/20/22   [provider]     Vital Signs: BP 124/84   Pulse 90   Temp 98.5 F (36.9 C) (Axillary)   Resp 19   Ht 5' 5"$  (1.651 m)   Wt 187 lb 6.3 oz (85 kg)   SpO2 98%   BMI 31.18 kg/m   Physical Exam Vitals reviewed.  Constitutional:      General: She is not in acute distress.    Appearance: She is ill-appearing.  Eyes:     Extraocular Movements: Extraocular movements intact.     Pupils: Pupils are equal, round, and reactive to light.  Cardiovascular:     Rate and Rhythm: Normal rate and regular rhythm.  Pulmonary:     Effort: Pulmonary effort is normal. No  respiratory distress.     Comments: Mechanically ventilated Musculoskeletal:     Right lower leg: No edema.     Left lower leg: No edema.  Skin:    General: Skin is warm and dry.     Comments: R CFA site covered with pressure dressing. Unable to assess. Vas [place] ordered.   Neurological:     Mental Status: She is alert.     Comments: Alert, aware  Unable to assess speech, mechanically ventilated PERRL bilaterally Hand grip strength equal bilaterally. 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 Bilateral testing is considered an integral part of a complete examination. Limited examinations for reoccurring indications may be performed as noted. +------------+----------+--------+------+----------+ Right DuplexPSV (cm/s)WaveformPlaqueComment(s) +------------+----------+--------+------+----------+ CFA            110                             +------------+----------+--------+------+----------+ Prox SFA        99                             +------------+----------+--------+------+----------+ Right Vein comments:Patent right common femoral vein.  Summary: No evidence of right groin pseudoaneurysm or AVF.     --------------------------------------------------------------------------------    Preliminary    ECHOCARDIOGRAM COMPLETE  Result Date: 04/11/2022    ECHOCARDIOGRAM REPORT   Patient Name:   Patricia [name] Date of Exam: 04/10/2022 Medical Rec #:  [id number]       Height:       65.0 in Accession #:    [id number]      Weight:       176.8 lb Date of Birth:  [birth year]       BSA:          1.877 m Patient Age:    57 years        BP:           122/82 mmHg Patient Gender: F               HR:           80 bpm. Exam Location:  Inpatient Procedure: 2D Echo, Color Doppler and Cardiac Doppler Indications:    stroke  History:        Patient has no prior history of Echocardiogram examinations.                 Risk Factors:Hypertension and Current Smoker.  Sonographer:    Johny Chess RDCS Referring Phys: [id number] [place]  1. Left ventricular ejection fraction, by estimation, is 60 to 65%. The left ventricle has normal function. Left ventricular endocardial border not optimally defined to evaluate regional wall motion. Left ventricular diastolic parameters were normal.  2. Right ventricular systolic function is normal. The right ventricular size is normal. There is normal pulmonary artery systolic pressure. The estimated right ventricular systolic pressure is 123456 mmHg.  3. The mitral valve is normal in structure. Trivial mitral valve regurgitation. No evidence of mitral stenosis.  4. The aortic valve was not well visualized. Aortic valve regurgitation is not visualized. No aortic stenosis is present.  5. The inferior vena cava is normal in size with greater than 50% respiratory variability, suggesting right atrial pressure of 3 mmHg. FINDINGS  Left Ventricle: Left ventricular ejection fraction, by estimation, is 60 to 65%. The left ventricle has normal function. Left ventricular endocardial border not optimally defined to evaluate regional wall motion. The left ventricular internal cavity size was  normal in size. There is no left ventricular hypertrophy. Left ventricular diastolic parameters were normal. Right Ventricle: The right ventricular size is normal. No increase in right ventricular wall thickness. Right ventricular systolic function is normal. There is normal pulmonary artery systolic pressure. The tricuspid regurgitant velocity is 2.02 m/s, and  with an assumed right atrial pressure of 3 mmHg, the estimated right ventricular systolic pressure is 123456 mmHg. Left Atrium: Left  atrial size was normal in size. Right Atrium: Right atrial size was normal in size. Pericardium: Trivial pericardial effusion is present. Mitral Valve: The mitral valve is normal in structure. Trivial mitral valve regurgitation. No evidence of mitral valve stenosis. Tricuspid Valve: The tricuspid valve is normal in structure. Tricuspid valve regurgitation is trivial. No evidence of tricuspid stenosis. Aortic Valve: The aortic valve was not well visualized. Aortic valve regurgitation is not visualized. No aortic stenosis is present. Pulmonic Valve: The pulmonic valve was normal in structure. Pulmonic valve regurgitation is trivial. No evidence of pulmonic stenosis. Aorta: The aortic root is normal in size and structure. Venous: The inferior vena cava is normal in size with greater than 50% respiratory variability, suggesting right atrial pressure of 3 mmHg. IAS/Shunts: No atrial level shunt detected by color flow Doppler.  LEFT VENTRICLE PLAX 2D LVIDd:         4.00 cm   Diastology LVIDs:         2.40 cm   LV e' medial:  8.92 cm/s LV PW:         0.80 cm   LV e' lateral: 7.51 cm/s LV IVS:        0.70 cm LVOT diam:     2.00 cm LV SV:         60 LV SV Index:   32 LVOT Area:     3.14 cm  RIGHT VENTRICLE             IVC RV S prime:     14.00 cm/s  IVC diam: 1.40 cm TAPSE (M-mode): 2.5 cm LEFT ATRIUM           Index        RIGHT ATRIUM           Index LA diam:      2.60 cm 1.39 cm/m   RA Area:     10.40 cm LA Vol (A2C): 26.6 ml 14.17  ml/m  RA Volume:   20.70 ml  11.03 ml/m  AORTIC VALVE LVOT Vmax:   86.60 cm/s LVOT Vmean:  58.200 cm/s LVOT VTI:    0.191 m  AORTA Ao Root diam: 3.10 cm TRICUSPID VALVE TR Peak grad:   16.3 mmHg TR Vmax:        202.00 cm/s  SHUNTS Systemic VTI:  0.19 m Systemic Diam: 2.00 cm Cherlynn Kaiser MD Electronically signed by Cherlynn Kaiser MD Signature Date/Time: 04/11/2022/10:49:24 AM    Final    MR ANGIO HEAD WO CONTRAST  Result Date: 04/10/2022 CLINICAL DATA:  Follow-up examination for acute stroke, status post TNK and thrombectomy. EXAM: MRI HEAD WITHOUT CONTRAST MRA HEAD WITHOUT CONTRAST TECHNIQUE: Multiplanar, multi-echo pulse sequences of the brain and surrounding structures were acquired without intravenous contrast. Angiographic images of the Circle of Willis were acquired using MRA technique without intravenous contrast. COMPARISON:  Comparison made with prior CTs from 04/09/2022. FINDINGS: MRI HEAD FINDINGS Brain: Cerebral volume within normal limits. No significant cerebral white matter disease for age. Restricted diffusion involving the right caudate and lentiform nuclei, consistent with acute right MCA distribution infarct (series 2, image 32). No associated hemorrhage or significant regional mass effect. No other evidence for acute or subacute ischemia elsewhere within the brain. Gray-white matter differentiation otherwise maintained. No acute intracranial hemorrhage. Single punctate chronic microhemorrhage noted at the right occipital region, of doubtful significance in isolation. Incidental small lipoma noted at the left quadrigeminal plate cistern. No other mass lesion, midline shift, or mass effect. Ventricles normal  size without hydrocephalus. No extra-axial fluid collection. Possible but are an arachnoid cyst measuring 8 mm noted at the sellar/suprasellar region (series 5, image 13). This is of doubtful significance. Pituitary gland and suprasellar region otherwise unremarkable. Vascular: Major  intracranial vascular flow voids are maintained. Skull and upper cervical spine: Craniocervical junction within normal limits. Bone marrow signal intensity normal. No scalp soft tissue abnormality. Sinuses/Orbits: Probable small dacryocystocele at the medial left orbit measures 9 mm (series 6, image 9). No associated inflammatory changes. Globes and orbital soft tissues otherwise unremarkable. Few small maxillary sinus retention cyst noted. Paranasal sinuses are otherwise largely clear. No mastoid effusion. Other: Endotracheal and enteric tubes in place. MRA HEAD FINDINGS Anterior circulation: Both internal carotid arteries are widely patent to the termini without stenosis or other abnormality. A1 segments patent bilaterally. Right A1 hypoplastic. Normal anterior communicating artery complex. Both ACAs remain patent to their distal aspects. There has been interval revascularization of previously seen right M1 occlusion. Right MCA and its distal branches are now widely patent and well perfused. Left M1 segment and its distal branches remain widely patent and well perfused as well. Posterior circulation: Vertebral arteries are diminutive but patent without stenosis. Both PICA patent. Basilar markedly diminutive but patent to its distal aspect without appreciable stenosis. Right AICA of supplied via the right vertebral artery. Superior cerebellar arteries patent bilaterally. Fetal type origin of the PCAs, both of which remain widely patent to their distal aspects. Anatomic variants: As above.  No aneurysm. IMPRESSION: MRI HEAD IMPRESSION: 1. 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 No associated hemorrhage or significant regional mass effect. 2. Otherwise normal brain MRI for age. MRA HEAD IMPRESSION: 1. Interval revascularization of previously seen right M1 occlusion. Right MCA and its distal branches are now widely patent and well perfused. 2. Otherwise stable and normal  intracranial MRA. Electronically Signed   By: Jeannine Boga M.D.   On: 04/10/2022 23:09   MR BRAIN WO CONTRAST  Result Date: 04/10/2022 CLINICAL DATA:  Follow-up examination for acute stroke, status post TNK and thrombectomy. EXAM: MRI HEAD WITHOUT CONTRAST MRA HEAD WITHOUT CONTRAST TECHNIQUE: Multiplanar, multi-echo pulse sequences of the brain and surrounding structures were acquired without intravenous contrast. Angiographic images of the Circle of Willis were acquired using MRA technique without intravenous contrast. COMPARISON:  Comparison made with prior CTs from 04/09/2022. FINDINGS: MRI HEAD FINDINGS Brain: Cerebral volume within normal limits. No significant cerebral white matter disease for age. Restricted diffusion involving the right caudate and lentiform nuclei, consistent with acute right MCA distribution infarct (series 2, image 32). No associated hemorrhage or significant regional mass effect. No other evidence for acute or subacute ischemia elsewhere within the brain. Gray-white matter differentiation otherwise maintained. No acute intracranial hemorrhage. Single punctate chronic microhemorrhage noted at the right occipital region, of doubtful significance in isolation. Incidental small lipoma noted at the left quadrigeminal plate cistern. No other mass lesion, midline shift, or mass effect. Ventricles normal size without hydrocephalus. No extra-axial fluid collection. Possible but are an arachnoid cyst measuring 8 mm noted at the sellar/suprasellar region (series 5, image 13). This is of doubtful significance. Pituitary gland and suprasellar region otherwise unremarkable. Vascular: Major intracranial vascular flow voids are maintained. Skull and upper cervical spine: Craniocervical junction within normal limits. Bone marrow signal intensity normal. No scalp soft tissue abnormality. Sinuses/Orbits: Probable small dacryocystocele at the medial left orbit measures 9 mm (series 6, image 9).  No associated inflammatory changes. Globes and orbital  soft tissues otherwise unremarkable. Few small maxillary sinus retention cyst noted. Paranasal sinuses are otherwise largely clear. No mastoid effusion. Other: Endotracheal and enteric tubes in place. MRA HEAD FINDINGS Anterior circulation: Both internal carotid arteries are widely patent to the termini without stenosis or other abnormality. A1 segments patent bilaterally. Right A1 hypoplastic. Normal anterior communicating artery complex. Both ACAs remain patent to their distal aspects. There has been interval revascularization of previously seen right M1 occlusion. Right MCA and its distal branches are now widely patent and well perfused. Left M1 segment and its distal branches remain widely patent and well perfused as well. Posterior circulation: Vertebral arteries are diminutive but patent without stenosis. Both PICA patent. Basilar markedly diminutive but patent to its distal aspect without appreciable stenosis. Right AICA of supplied via the right vertebral artery. Superior cerebellar arteries patent bilaterally. Fetal type origin of the PCAs, both of which remain widely patent to their distal aspects. Anatomic variants: As above.  No aneurysm. IMPRESSION: MRI HEAD IMPRESSION: 1. 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 No associated hemorrhage or significant regional mass effect. 2. Otherwise normal brain MRI for age. MRA HEAD IMPRESSION: 1. Interval revascularization of previously seen right M1 occlusion. Right MCA and its distal branches are now widely patent and well perfused. 2. Otherwise stable and normal intracranial MRA. Electronically Signed   By: Jeannine Boga M.D.   On: 04/10/2022 23:09   VAS [place] GROIN PSEUDOANEURYSM  Result Date: 04/10/2022  ARTERIAL PSEUDOANEURYSM  Patient Name:  Patricia [name]  Date of Exam:   04/10/2022 Medical Rec #: [id number]        Accession #:    [id number] Date of  Birth: [birth year]        Patient Gender: F Patient Age:   78 years Exam Location:  [facility] Procedure:      VAS [place] GROIN PSEUDOANEURYSM Referring Phys: Luanne Bras --------------------------------------------------------------------------------  Exam: Right groin Indications: Patient complains of oozing from stick site. 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 Comparison Study: No prior study Performing Technologist: Sharion Dove RVS  Examination Guidelines: A complete evaluation includes B-mode imaging, spectral Doppler, color Doppler, and power Doppler as needed of all accessible portions of each vessel. Bilateral testing is considered an integral part of a complete examination. Limited examinations for reoccurring indications may be performed as noted.  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 The neck measures approximately 0.4 cm wide and 0.4 cm long.  Summary: Partially thrombosed pseudoaneurysm arising off the proximal SFA noted in the right proximal thigh/groin area. Diagnosing physician: Harold Barban MD Electronically signed by Harold Barban MD on 04/10/2022 at 8:13:26 PM.   --------------------------------------------------------------------------------    Final    DG CHEST PORT 1 VIEW  Result Date: 04/10/2022 CLINICAL DATA:  Endotracheal tube placement EXAM: PORTABLE CHEST 1 VIEW COMPARISON:  Chest x-ray October 30, 2019 FINDINGS: The endotracheal tube terminates approximately 3.5 cm above the carina. The NG tube side port terminates below the diaphragm in the distal tip is below the field of view. The cardiomediastinal silhouette is unchanged in contour. No focal pulmonary opacity. No  pleural effusion or pneumothorax. The visualized upper abdomen is unremarkable. No acute osseous abnormality. IMPRESSION: 1. Adequate  positioning of the endotracheal tube and NG tube. 2. No acute cardiopulmonary abnormality. Electronically Signed   By: Beryle Flock M.D.   On: 04/10/2022 14:04   DG Abd Portable 1V  Result Date: 04/10/2022 CLINICAL DATA:  OG tube placement. EXAM: PORTABLE ABDOMEN - 1 VIEW COMPARISON:  None Available. FINDINGS: OG tube tip is in the mid stomach. Proximal side port is at or just below the level of the GE junction. Tube could be advanced another 3-4 cm to ensure side port is below the GE junction, as clinically warranted visualized abdomen demonstrates nonspecific gas pattern. Contrast excretion noted from both kidneys compatible with contrast infused studies from 04/09/2022. IMPRESSION: OG tube tip is in the mid stomach. Proximal side port is at or just below the level of the GE junction. See above. Electronically Signed   By: Misty Stanley M.D.   On: 04/10/2022 07:34   CT HEAD WO CONTRAST (5MM)  Result Date: 04/10/2022 CLINICAL DATA:  Stroke follow-up. EXAM: CT HEAD WITHOUT CONTRAST TECHNIQUE: Contiguous axial images were obtained from the base of the skull through the vertex without intravenous contrast. RADIATION DOSE REDUCTION: This exam was performed according to the departmental dose-optimization program which includes automated exposure control, adjustment of the mA and/or kV according to patient size and/or use of iterative reconstruction technique. COMPARISON:  Head CT from yesterday FINDINGS: Brain: No evidence of acute infarction, hemorrhage, hydrocephalus, extra-axial collection or mass lesion/mass effect. Vascular: Prominent density of right M1 segment but only at the level of streak artifact on coronal reformats. Skull: Normal. Negative for fracture or focal lesion. Sinuses/Orbits: Negative IMPRESSION: Negative for hemorrhage or visible infarct. Electronically  Signed   By: Jorje Guild M.D.   On: 04/10/2022 04:02   CT ANGIO HEAD NECK W WO CM (CODE STROKE)  Result Date: 04/09/2022 CLINICAL DATA:  Initial evaluation for neuro deficit, stroke. EXAM: CT ANGIOGRAPHY HEAD AND NECK TECHNIQUE: Multidetector CT imaging of the head and neck was performed using the standard protocol during bolus administration of intravenous contrast. Multiplanar CT image reconstructions and MIPs were obtained to evaluate the vascular anatomy. Carotid stenosis measurements (when applicable) are obtained utilizing NASCET criteria, using the distal internal carotid diameter as the denominator. RADIATION DOSE REDUCTION: This exam was performed according to the departmental dose-optimization program which includes automated exposure control, adjustment of the mA and/or kV according to patient size and/or use of iterative reconstruction technique. CONTRAST:  48m OMNIPAQUE IOHEXOL 350 MG/ML SOLN COMPARISON:  Prior CT from earlier the same day. FINDINGS: CTA NECK FINDINGS Aortic arch: Visualized aortic arch normal caliber with standard branch pattern. No stenosis about the origin of the great vessels. Right carotid system: Right common and internal carotid arteries are patent without dissection. Transverse filling defect involving the proximal cervical right ICA just distal to the bifurcation most characteristic of a carotid web (series 9, image 52). Associated short-segment stenosis of up to 55% by NASCET criteria. Left carotid system: Left common and internal carotid arteries are patent without stenosis or dissection. Vertebral arteries: Both vertebral arteries arise from the subclavian arteries. No proximal subclavian artery stenosis. Vertebral arteries are diminutive but patent without stenosis or dissection. Skeleton: No discrete or worrisome osseous lesions. Mild-to-moderate spondylosis noted at C5-6 and C6-7. Other neck: No other acute soft tissue abnormality within the neck. 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 (Ref: J Am Coll Radiol. 2015 Feb;12(2): 143-50). Upper chest:  Visualized  upper chest demonstrates no acute finding. Review of the MIP images confirms the above findings CTA HEAD FINDINGS Anterior circulation: Both internal carotid arteries are patent to the termini without stenosis. A1 segments patent bilaterally. Normal anterior communicating artery complex. Anterior cerebral arteries patent bilaterally. Left M1 segment widely patent. No proximal left MCA branch occlusion or high-grade stenosis. Distal left MCA branches well perfused. On the right, there is acute occlusion of the mid and distal right M1 segment, corresponding with hyperdense vessel on prior noncontrast CT (series 7, image 98). Attenuated flow within the right MCA territory distally, likely collateral nature. Posterior circulation: V4 segments diminutive but widely patent without stenosis. Both PICA patent. Basilar fenestrated and markedly diminutive but patent without stenosis. Superior cerebral arteries patent bilaterally. Fetal type origin of the PCAs, both of which are widely patent to their distal aspects. Venous sinuses: Patent allowing for timing the contrast bolus. Anatomic variants: Fetal type origin of the PCAs with overall diminutive vertebrobasilar system. No aneurysm. Review of the MIP images confirms the above findings IMPRESSION: 1. Positive CTA for emergent large vessel occlusion, with acute occlusion of the mid and distal right M1 segment. 2. Transverse filling defect involving the proximal cervical right ICA just distal to the bifurcation, most characteristic of a carotid web. Associated short-segment stenosis of up to 55% by NASCET criteria. 3. Fetal type origin of the PCAs with overall diminutive vertebrobasilar system. These results were communicated to Dr. Curly Shores at 10:42 pm on 04/09/2022 by text  page via the [facility] messaging system. Electronically Signed   By: Jeannine Boga M.D.   On: 04/09/2022 22:55   CT HEAD CODE STROKE WO CONTRAST  Result Date: 04/09/2022 CLINICAL DATA:  Code stroke. Initial evaluation for neuro deficit, stroke. EXAM: CT HEAD WITHOUT CONTRAST TECHNIQUE: Contiguous axial images were obtained from the base of the skull through the vertex without intravenous contrast. RADIATION DOSE REDUCTION: This exam was performed according to the departmental dose-optimization program which includes automated exposure control, adjustment of the mA and/or kV according to patient size and/or use of iterative reconstruction technique. COMPARISON:  None Available. FINDINGS: Brain: Cerebral volume within normal limits. No acute intracranial hemorrhage. There is subtle loss of gray-white matter differentiation involving the right insula as well as the right caudate and lentiform nuclei, concerning for early acute right MCA territory infarct. Gray-white matter differentiation otherwise grossly maintained. No mass lesion or midline shift. No hydrocephalus or extra-axial fluid collection. Tiny lipoma noted near the quadrigeminal plate cistern. Vascular: Dense right M1 segment, concerning for thrombus in large vessel occlusion. Skull: Scalp soft tissues and calvarium demonstrate no acute finding. Sinuses/Orbits: Right gaze preference noted. Paranasal sinuses are largely clear. No mastoid effusion. Other: None. ASPECTS [name] Stroke Program Early CT Score) - Ganglionic level infarction (caudate, lentiform nuclei, internal capsule, insula, M1-M3 cortex): 4 - Supraganglionic infarction (M4-M6 cortex): 3 Total score (0-10 with 10 being normal): 7 IMPRESSION: 1. Dense right M1 segment, concerning for thrombus/large vessel occlusion. 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 No intracranial hemorrhage. 2. ASPECTS is 7. These results were communicated to Dr. Curly Shores at 10:11 pm on 04/09/2022 by  text page via the [facility] messaging system. Electronically Signed   By: Jeannine Boga M.D.   On: 04/09/2022 22:16    Labs:  CBC: Recent Labs    04/09/22 2150 04/09/22 2213 04/10/22 0424 04/11/22 0518  WBC 9.1  --  9.1 9.7  HGB 12.1 11.9* 11.5* 9.7*  HCT 33.5* 35.0* 31.7* 28.3*  PLT  226  --  247 180    COAGS: Recent Labs    04/09/22 2150  INR 0.9  APTT 31    BMP: Recent Labs    04/09/22 2150 04/09/22 2213 04/10/22 0424 04/10/22 2111 04/11/22 0518  NA 135 140 138  --  139  K 3.1* 3.1* 3.0* 3.8 3.3*  CL 101 101 104  --  110  CO2 26  --  24  --  22  GLUCOSE 110* 107* 173*  --  104*  BUN 14 16 11  $ --  9  CALCIUM 9.0  --  8.2*  --  7.9*  CREATININE 1.01* 1.00 0.87  --  0.94  GFRNONAA >60  --  >60  --  >60    LIVER FUNCTION TESTS: Recent Labs    04/09/22 2150  BILITOT 0.7  AST 25  ALT 11  ALKPHOS 84  PROT 6.6  ALBUMIN 3.8    Assessment and Plan:  57 yo female with PMH significant for tobacco abuse, HTN and asthma presented to [facility] ED via EMS for sudden onset left arm numbness. Code stroke was activated for R MCA syndrome. 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 Post procedural US exam was performed to r/u pseudoaneurysm to R CFA puncture site. 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 Repeat US performed today found no evidence of right groin pseudoaneurysm or AVF. She was subsequently extubated.   Plan per PCCM/Neuro  Please contact IR with questions or concerns.   Electronically Signed: Tyson Alias, NP 04/11/2022, 11:27 AM   I spent a total of 15 Minutes at the the patient's bedside AND on the patient's hospital floor or unit, greater than 50% of which was counseling/coordinating care for mechanical thrombectomy for R MCA occlusion/stent placement to R proximal R ICA for carotid web.

## 2022-04-11 NOTE — Progress Notes (Addendum)
Spoke with Dr. Curly Shores with Neurology to clarify BP parameters.  New BP goal < 160

## 2022-04-11 NOTE — Progress Notes (Signed)
NAME:  Patricia [name], MRN:  [id number], DOB:  1966-02-23, LOS: 2 ADMISSION DATE:  04/09/2022, CONSULTATION DATE:  04/10/22 REFERRING MD:  Patrecia Pour , CHIEF COMPLAINT:  stroke    History of Present Illness:  57 yo female smoker developed sudden onset of Lt arm numbness.  Code stroke activated for Rt MCA syndrome.  Seen by neurology in ER and treated with thrombolytic.  Found to have occluded Rt MCA M1 and had mechanical thrombectomy and stent to proximal Rt ICA.  Pertinent  Medical History  Asthma, HTN  Significant Hospital Events: Including procedures, antibiotic start and stop dates in addition to other pertinent events   2/10 admit, TNKase, mechanical thrombectomy to Rt M1 and stent to Rt ICA  Interim History / Subjective:   Intubated critically illl, on mechanical life support, family at bedside  Objective   Blood pressure 133/84, pulse 91, temperature 98.5 F (36.9 C), temperature source Axillary, resp. rate 13, height 5' 5"$  (1.651 m), weight 85 kg, SpO2 100 %.    Vent Mode: PRVC FiO2 (%):  [40 %] 40 % Set Rate:  [12 bmp] 12 bmp Vt Set:  [510 mL] 510 mL PEEP:  [5 cmH20] 5 cmH20 Plateau Pressure:  [14 cmH20-18 cmH20] 18 cmH20   Intake/Output Summary (Last 24 hours) at 04/11/2022 0845 Last data filed at 04/11/2022 0800 Gross per 24 hour  Intake 1970.52 ml  Output 551 ml  Net 1419.52 ml   Filed Weights   04/09/22 2100 04/11/22 0600  Weight: 80.2 kg 85 kg    Examination:  General - off sedation, awake, following commands, remains on vent  Eyes - tracking  ENT - ett in place  Cardiac - RRR, s1 s2  Chest - RRR, s1 s2  Abdomen - soft nt nd  Extremities - no edema  Skin - no rash  Neuro - RASS 0, weak grip in the left compared to right, moves all four ext on command  Resolved Hospital Problem list     Assessment & Plan:   Acute Rt MCA ischemic stroke with M1 occlusion s/p TNKase, mechanical thrombectomy and stent to Rt ICA. 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 P: Leg remains straight  Follow up US per IR today   Compromised airway. Hx of asthma. P: Post-op vent management  SAT SBT this AM  Place in PS, will discuss with RT   Hx of HTN, HLD. - continue statin  Holding home bp meds   Hypokalemia. - f/uBMET and mag   Best Practice (right click and "Reselect all SmartList Selections" daily)   Diet/type: NPO DVT prophylaxis: SCD GI prophylaxis: PPI Lines: N/A Foley:  N/A Code Status:  full code Last date of multidisciplinary goals of care discussion [updated son at bedside]  Labs       Latest Ref Rng & Units 04/11/2022    5:18 AM 04/10/2022    9:11 PM 04/10/2022    4:24 AM  CMP  Glucose 70 - 99 mg/dL 104   173   BUN 6 - 20 mg/dL 9   11   Creatinine 0.44 - 1.00 mg/dL 0.94   0.87   Sodium 135 - 145 mmol/L 139   138   Potassium 3.5 - 5.1 mmol/L 3.3  3.8  3.0   Chloride 98 - 111 mmol/L 110   104   CO2 22 - 32 mmol/L 22   24  Calcium 8.9 - 10.3 mg/dL 7.9   8.2        Latest Ref Rng & Units 04/11/2022    5:18 AM 04/10/2022    4:24 AM 04/09/2022   10:13 PM  CBC  WBC 4.0 - 10.5 K/uL 9.7  9.1    Hemoglobin 12.0 - 15.0 g/dL 9.7  11.5  11.9   Hematocrit 36.0 - 46.0 % 28.3  31.7  35.0   Platelets 150 - 400 K/uL 180  247      ABG    Component Value Date/Time   TCO2 29 04/09/2022 2213    CBG (last 3)  Recent Labs    04/09/22 2152  GLUCAP 112*   This patient is critically ill with multiple organ system failure; which, requires frequent high complexity decision making, assessment, support, evaluation, and titration of therapies. This was completed through the application of advanced monitoring technologies and extensive interpretation of multiple databases. During this encounter critical care time was devoted to patient care services described in this note for 32 minutes.  [facility] 04/11/2022 8:45 AM

## 2022-04-11 NOTE — Progress Notes (Signed)
STROKE TEAM PROGRESS NOTE   INTERVAL HISTORY Her son is at the bedside. RN at the bedside. Patient remains intubated but is weaning well with plans for extubation later today.  Neurointervention radiology recommended repeat US of right groin and if pseudoaneurysm has improved patient will be allowed to sit up and extubated later today.  She is awake alert and interactive and follows all commands she has only mild weakness of left grip and intrinsic hand muscles with good lower extremity strength.  Vital signs stable. Vitals:   04/11/22 0900 04/11/22 1000 04/11/22 1100 04/11/22 1200  BP: 117/75 124/84 109/70 116/70  Pulse: 92 90 91 97  Resp: 12 19 17 15  $ Temp:    98.9 F (37.2 C)  TempSrc:    Axillary  SpO2: 100% 98% 100% 99%  Weight:      Height:       CBC:  Recent Labs  Lab 04/09/22 2150 04/09/22 2213 04/10/22 0424 04/11/22 0518  WBC 9.1  --  9.1 9.7  NEUTROABS 5.3  --  8.1*  --   HGB 12.1   < > 11.5* 9.7*  HCT 33.5*   < > 31.7* 28.3*  MCV 92.5  --  93.2 99.6  PLT 226  --  247 180   < > = values in this interval not displayed.   Basic Metabolic Panel:  Recent Labs  Lab 04/10/22 0424 04/10/22 2111 04/11/22 0518  NA 138  --  139  K 3.0* 3.8 3.3*  CL 104  --  110  CO2 24  --  22  GLUCOSE 173*  --  104*  BUN 11  --  9  CREATININE 0.87  --  0.94  CALCIUM 8.2*  --  7.9*  MG  --   --  1.9   Lipid Panel:  Recent Labs  Lab 04/10/22 0424 04/11/22 0518  CHOL 132  --   TRIG 90 154*  HDL 33*  --   CHOLHDL 4.0  --   VLDL 18  --   LDLCALC 81  --    HgbA1c:  Recent Labs  Lab 04/09/22 2150  HGBA1C 5.1   Urine Drug Screen:  Recent Labs  Lab 04/10/22 0813  LABOPIA NONE DETECTED  COCAINSCRNUR NONE DETECTED  LABBENZ NONE DETECTED  AMPHETMU NONE DETECTED  THCU POSITIVE*  LABBARB NONE DETECTED    Alcohol Level  Recent Labs  Lab 04/10/22 0424  ETH <10    IMAGING past 24 hours VAS [place] GROIN PSEUDOANEURYSM  Result Date: 04/11/2022  ARTERIAL PSEUDOANEURYSM   Patient Name:  Patricia [name]  Date of Exam:   04/11/2022 Medical Rec #: [id number]        Accession #:    [id number] Date of Birth: 04-26-1965        Patient Gender: F Patient Age:   57 years Exam Location:  [facility] Procedure:      VAS [place] GROIN PSEUDOANEURYSM Referring Phys: Ascencion Dike --------------------------------------------------------------------------------  Exam: History of right groin pseudoaneurysm measuring 0.9 x 1.3 cm on 04/10/2022 Performing Technologist: Velva Harman Sturdivant RDMS, RVT  Examination Guidelines: A complete evaluation includes B-mode imaging, spectral Doppler, color Doppler, and power Doppler as needed of all accessible portions of each vessel. Bilateral testing is considered an integral part of a complete examination. Limited examinations for reoccurring indications may be performed as noted. +------------+----------+--------+------+----------+ Right DuplexPSV (cm/s)WaveformPlaqueComment(s) +------------+----------+--------+------+----------+ CFA            110                             +------------+----------+--------+------+----------+  Prox SFA        99                             +------------+----------+--------+------+----------+ Right Vein comments:Patent right common femoral vein.  Summary: No evidence of right groin pseudoaneurysm or AVF.    --------------------------------------------------------------------------------    Preliminary    ECHOCARDIOGRAM COMPLETE  Result Date: 04/11/2022    ECHOCARDIOGRAM REPORT   Patient Name:   Patricia [name] Date of Exam: 04/10/2022 Medical Rec #:  [id number]       Height:       65.0 in Accession #:    [id number]      Weight:       176.8 lb Date of Birth:  Jan 01, 1966       BSA:          1.877 m Patient Age:    57 years        BP:           122/82 mmHg Patient Gender: F               HR:           80 bpm. Exam Location:  Inpatient Procedure: 2D Echo, Color Doppler and Cardiac Doppler Indications:    stroke   History:        Patient has no prior history of Echocardiogram examinations.                 Risk Factors:Hypertension and Current Smoker.  Sonographer:    Johny Chess RDCS Referring Phys: [id number] [place]  1. Left ventricular ejection fraction, by estimation, is 60 to 65%. The left ventricle has normal function. Left ventricular endocardial border not optimally defined to evaluate regional wall motion. Left ventricular diastolic parameters were normal.  2. Right ventricular systolic function is normal. The right ventricular size is normal. There is normal pulmonary artery systolic pressure. The estimated right ventricular systolic pressure is 123456 mmHg.  3. The mitral valve is normal in structure. Trivial mitral valve regurgitation. No evidence of mitral stenosis.  4. The aortic valve was not well visualized. Aortic valve regurgitation is not visualized. No aortic stenosis is present.  5. The inferior vena cava is normal in size with greater than 50% respiratory variability, suggesting right atrial pressure of 3 mmHg. FINDINGS  Left Ventricle: Left ventricular ejection fraction, by estimation, is 60 to 65%. The left ventricle has normal function. Left ventricular endocardial border not optimally defined to evaluate regional wall motion. The left ventricular internal cavity size was normal in size. There is no left ventricular hypertrophy. Left ventricular diastolic parameters were normal. Right Ventricle: The right ventricular size is normal. No increase in right ventricular wall thickness. Right ventricular systolic function is normal. There is normal pulmonary artery systolic pressure. The tricuspid regurgitant velocity is 2.02 m/s, and  with an assumed right atrial pressure of 3 mmHg, the estimated right ventricular systolic pressure is 123456 mmHg. Left Atrium: Left atrial size was normal in size. Right Atrium: Right atrial size was normal in size. Pericardium: Trivial pericardial  effusion is present. Mitral Valve: The mitral valve is normal in structure. Trivial mitral valve regurgitation. No evidence of mitral valve stenosis. Tricuspid Valve: The tricuspid valve is normal in structure. Tricuspid valve regurgitation is trivial. No evidence of tricuspid stenosis. Aortic Valve: The aortic valve was not well visualized. Aortic valve regurgitation is not visualized. No aortic stenosis is  present. Pulmonic Valve: The pulmonic valve was normal in structure. Pulmonic valve regurgitation is trivial. No evidence of pulmonic stenosis. Aorta: The aortic root is normal in size and structure. Venous: The inferior vena cava is normal in size with greater than 50% respiratory variability, suggesting right atrial pressure of 3 mmHg. IAS/Shunts: No atrial level shunt detected by color flow Doppler.  LEFT VENTRICLE PLAX 2D LVIDd:         4.00 cm   Diastology LVIDs:         2.40 cm   LV e' medial:  8.92 cm/s LV PW:         0.80 cm   LV e' lateral: 7.51 cm/s LV IVS:        0.70 cm LVOT diam:     2.00 cm LV SV:         60 LV SV Index:   32 LVOT Area:     3.14 cm  RIGHT VENTRICLE             IVC RV S prime:     14.00 cm/s  IVC diam: 1.40 cm TAPSE (M-mode): 2.5 cm LEFT ATRIUM           Index        RIGHT ATRIUM           Index LA diam:      2.60 cm 1.39 cm/m   RA Area:     10.40 cm LA Vol (A2C): 26.6 ml 14.17 ml/m  RA Volume:   20.70 ml  11.03 ml/m  AORTIC VALVE LVOT Vmax:   86.60 cm/s LVOT Vmean:  58.200 cm/s LVOT VTI:    0.191 m  AORTA Ao Root diam: 3.10 cm TRICUSPID VALVE TR Peak grad:   16.3 mmHg TR Vmax:        202.00 cm/s  SHUNTS Systemic VTI:  0.19 m Systemic Diam: 2.00 cm Cherlynn Kaiser MD Electronically signed by Cherlynn Kaiser MD Signature Date/Time: 04/11/2022/10:49:24 AM    Final    MR ANGIO HEAD WO CONTRAST  Result Date: 04/10/2022 CLINICAL DATA:  Follow-up examination for acute stroke, status post TNK and thrombectomy. EXAM: MRI HEAD WITHOUT CONTRAST MRA HEAD WITHOUT CONTRAST TECHNIQUE:  Multiplanar, multi-echo pulse sequences of the brain and surrounding structures were acquired without intravenous contrast. Angiographic images of the Circle of Willis were acquired using MRA technique without intravenous contrast. COMPARISON:  Comparison made with prior CTs from 04/09/2022. FINDINGS: MRI HEAD FINDINGS Brain: Cerebral volume within normal limits. No significant cerebral white matter disease for age. Restricted diffusion involving the right caudate and lentiform nuclei, consistent with acute right MCA distribution infarct (series 2, image 32). No associated hemorrhage or significant regional mass effect. No other evidence for acute or subacute ischemia elsewhere within the brain. Gray-white matter differentiation otherwise maintained. No acute intracranial hemorrhage. Single punctate chronic microhemorrhage noted at the right occipital region, of doubtful significance in isolation. Incidental small lipoma noted at the left quadrigeminal plate cistern. No other mass lesion, midline shift, or mass effect. Ventricles normal size without hydrocephalus. No extra-axial fluid collection. Possible but are an arachnoid cyst measuring 8 mm noted at the sellar/suprasellar region (series 5, image 13). This is of doubtful significance. Pituitary gland and suprasellar region otherwise unremarkable. Vascular: Major intracranial vascular flow voids are maintained. Skull and upper cervical spine: Craniocervical junction within normal limits. Bone marrow signal intensity normal. No scalp soft tissue abnormality. Sinuses/Orbits: Probable small dacryocystocele at the medial left orbit measures 9 mm (series  6, image 9). No associated inflammatory changes. Globes and orbital soft tissues otherwise unremarkable. Few small maxillary sinus retention cyst noted. Paranasal sinuses are otherwise largely clear. No mastoid effusion. Other: Endotracheal and enteric tubes in place. MRA HEAD FINDINGS Anterior circulation: Both  internal carotid arteries are widely patent to the termini without stenosis or other abnormality. A1 segments patent bilaterally. Right A1 hypoplastic. Normal anterior communicating artery complex. Both ACAs remain patent to their distal aspects. There has been interval revascularization of previously seen right M1 occlusion. Right MCA and its distal branches are now widely patent and well perfused. Left M1 segment and its distal branches remain widely patent and well perfused as well. Posterior circulation: Vertebral arteries are diminutive but patent without stenosis. Both PICA patent. Basilar markedly diminutive but patent to its distal aspect without appreciable stenosis. Right AICA of supplied via the right vertebral artery. Superior cerebellar arteries patent bilaterally. Fetal type origin of the PCAs, both of which remain widely patent to their distal aspects. Anatomic variants: As above.  No aneurysm. IMPRESSION: MRI HEAD IMPRESSION: 1. 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 No associated hemorrhage or significant regional mass effect. 2. Otherwise normal brain MRI for age. MRA HEAD IMPRESSION: 1. Interval revascularization of previously seen right M1 occlusion. Right MCA and its distal branches are now widely patent and well perfused. 2. Otherwise stable and normal intracranial MRA. Electronically Signed   By: Jeannine Boga M.D.   On: 04/10/2022 23:09   MR BRAIN WO CONTRAST  Result Date: 04/10/2022 CLINICAL DATA:  Follow-up examination for acute stroke, status post TNK and thrombectomy. EXAM: MRI HEAD WITHOUT CONTRAST MRA HEAD WITHOUT CONTRAST TECHNIQUE: Multiplanar, multi-echo pulse sequences of the brain and surrounding structures were acquired without intravenous contrast. Angiographic images of the Circle of Willis were acquired using MRA technique without intravenous contrast. COMPARISON:  Comparison made with prior CTs from 04/09/2022.  FINDINGS: MRI HEAD FINDINGS Brain: Cerebral volume within normal limits. No significant cerebral white matter disease for age. Restricted diffusion involving the right caudate and lentiform nuclei, consistent with acute right MCA distribution infarct (series 2, image 32). No associated hemorrhage or significant regional mass effect. No other evidence for acute or subacute ischemia elsewhere within the brain. Gray-white matter differentiation otherwise maintained. No acute intracranial hemorrhage. Single punctate chronic microhemorrhage noted at the right occipital region, of doubtful significance in isolation. Incidental small lipoma noted at the left quadrigeminal plate cistern. No other mass lesion, midline shift, or mass effect. Ventricles normal size without hydrocephalus. No extra-axial fluid collection. Possible but are an arachnoid cyst measuring 8 mm noted at the sellar/suprasellar region (series 5, image 13). This is of doubtful significance. Pituitary gland and suprasellar region otherwise unremarkable. Vascular: Major intracranial vascular flow voids are maintained. Skull and upper cervical spine: Craniocervical junction within normal limits. Bone marrow signal intensity normal. No scalp soft tissue abnormality. Sinuses/Orbits: Probable small dacryocystocele at the medial left orbit measures 9 mm (series 6, image 9). No associated inflammatory changes. Globes and orbital soft tissues otherwise unremarkable. Few small maxillary sinus retention cyst noted. Paranasal sinuses are otherwise largely clear. No mastoid effusion. Other: Endotracheal and enteric tubes in place. MRA HEAD FINDINGS Anterior circulation: Both internal carotid arteries are widely patent to the termini without stenosis or other abnormality. A1 segments patent bilaterally. Right A1 hypoplastic. Normal anterior communicating artery complex. Both ACAs remain patent to their distal aspects. There has been interval revascularization of  previously seen right M1 occlusion.  Right MCA and its distal branches are now widely patent and well perfused. Left M1 segment and its distal branches remain widely patent and well perfused as well. Posterior circulation: Vertebral arteries are diminutive but patent without stenosis. Both PICA patent. Basilar markedly diminutive but patent to its distal aspect without appreciable stenosis. Right AICA of supplied via the right vertebral artery. Superior cerebellar arteries patent bilaterally. Fetal type origin of the PCAs, both of which remain widely patent to their distal aspects. Anatomic variants: As above.  No aneurysm. IMPRESSION: MRI HEAD IMPRESSION: 1. 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 No associated hemorrhage or significant regional mass effect. 2. Otherwise normal brain MRI for age. MRA HEAD IMPRESSION: 1. Interval revascularization of previously seen right M1 occlusion. Right MCA and its distal branches are now widely patent and well perfused. 2. Otherwise stable and normal intracranial MRA. Electronically Signed   By: Jeannine Boga M.D.   On: 04/10/2022 23:09    PHYSICAL EXAM  Temp:  [97.1 F (36.2 C)-98.9 F (37.2 C)] 98.9 F (37.2 C) (02/12 1200) Pulse Rate:  [63-97] 97 (02/12 1200) Resp:  [0-25] 15 (02/12 1200) BP: (92-158)/(65-107) 116/70 (02/12 1200) SpO2:  [98 %-100 %] 99 % (02/12 1200) FiO2 (%):  [40 %] 40 % (02/12 0800) Weight:  [85 kg] 85 kg (02/12 0600)  General - Well nourished, well developed pleasant middle-age African-American lady, in no apparent distress. Cardiovascular - Regular rhythm and rate.  Mental Status -  Patient is intubated  .  Awake alert and interactive.  Follows all commands.  No aphasia  Cranial Nerves II - XII - II - Visual field no blink to threat  III, IV, VI - Extraocular movements intact. V - Facial sensation intact bilaterally. VII -unable to assess  VIII - Hearing & vestibular intact  bilaterally. X - Palate elevates symmetrically. XI - Chin turning & shoulder shrug intact bilaterally. XII - Tongue protrusion intact.  Motor Strength -good and symmetric antigravity strength in all 4 extremities.  Mild weakness of left grip and intrinsic hand muscles.  Fine finger movements are diminished on the left.  Orbits right over left upper extremity. Motor Tone - Muscle tone was assessed at the neck and appendages and was normal.  Sensory - responds to noxious stimuli   Coordination - Unable to assess  Gait and Station - deferred.  ASSESSMENT/PLAN [name] is a 57 y.o. female with history of  smoking, HTN and asthma who presents to [facility] ED for evaluation of Right MCA syndrome with left side weakness and numbness. 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 most characteristic of a carotid web. Associated short-segment stenosis of up to 55% by NASCET criteria. Fetal type origin of the PCAs with overall diminutive vertebrobasilar system IR s/p right MCA TICI 3 revascularization and right ICA stent. Post IR CT No ICH  MRI / MRA acute right MCA infarct involving right caudate and lentiform nuclei.  No associated hemorrhage or mass effect.  MR  angiogram shows interval revascularization of the previously occluded right M1. 2D Echo ejection fraction 60 to 65%. LDL 81 HgbA1c 5.1 UDS +THC VTE prophylaxis - SCD's  No antithrombotic prior to admission, now on ASA and brilinta given right ICA stenting.   Therapy recommendations:   pending  Disposition:  pending   Right groin pseudoaneurysm [place] right groin - small pseudoaneurysm  S/p manual compression  Bed flat  Repeat US right groin 04/11/2022 shows no pseudoaneurysm  Acute Respiratory Failure due to inability to protect airway  Vent management per CCM  Goal SPO2 > 92% Keep intubated today for right groin pseudoaneurysm, ? Aspiration with dysphagia Wean off vent in am if able after [place] right groin  Hypertension Home meds:  none  On the low end On neo gtt to maintain BP goal  BP goal 120-140 within 24h post IR Long-term BP goal normotensive  Hyperlipidemia Home meds:  none LDL 81, goal < 70 Add Crestor 20 mg   Continue statin at discharge  Dysphagia  OG tube  On ASA and brilinta via OG Consider starting TF in the next 24 hrs   Tobacco abuse Current smoker Smoking cessation counseling will be provided  Other Stroke Risk Factors THC abuse, UDS positive for THC, cessation education will be provided.  Other Active Problems Hypokalemia, K 3.0 replacing an check in the am    Hospital day # 2 Patient is doing well neurologically and left hemiparesis is improving with only mild residual left hand and grip weakness.  Recommend extubate today as tolerated use repeat right groin ultrasound shows improvement in pseudoaneurysm.  Mobilize out of bed.  Therapy consults.  Continue dual antiplatelet therapy for carotid stent.  Discussed with Dr. Estanislado Pandy neurointerventional radiology and Dr. Valeta Harms critical care medicine.  Discussed with patient and son and answered questions. This patient is critically ill and at significant risk of neurological worsening, death and care requires constant monitoring of vital signs, hemodynamics,respiratory and cardiac monitoring, extensive review of multiple databases, frequent neurological assessment, discussion with family, other specialists and medical decision making of high complexity.I have made any additions or clarifications  directly to the above note.This critical care time does not reflect procedure time, or teaching time or supervisory time of PA/NP/Med Resident etc but could involve care discussion time.  I spent 30 minutes of neurocritical care time  in the care of  this patient.    Antony Contras, MD  To contact Stroke Continuity provider, please refer to [email or web address] After hours, contact General Neurology

## 2022-04-11 NOTE — TOC CAGE-AID Note (Signed)
Transition of Care [facility]) - CAGE-AID Screening   Patient Details  Name: Patricia [name] MRN: [id number] Date of Birth: 1965/12/30  [facility] CM/SW Contact:    Benard Halsted, LCSW Phone Number: 04/11/2022, 8:08 AM   Clinical Narrative: Patient intubated and unable to participate in screening.    CAGE-AID Screening: Substance Abuse Screening unable to be completed due to: : Patient unable to participate

## 2022-04-11 NOTE — Progress Notes (Signed)
eLink Physician-Brief Progress Note Patient Name: ALYZAH TREVINO DOB: [birth year] MRN: [id number]   Date of Service  04/11/2022  HPI/Events of Note  Bladder scan showed 81cc  eICU Interventions  Ordered in and out cath     Intervention Category Intermediate Interventions: Other:  Judd Lien 04/11/2022, 1:46 AM

## 2022-04-11 NOTE — Progress Notes (Signed)
PCCM:  Pseudoaneursym stable on [place]  Plan for extubation   Garner Nash, DO [facility] 04/11/2022 9:55 AM

## 2022-04-11 NOTE — Procedures (Signed)
Extubation Procedure Note  Patient Details:   Name: Patricia [name] DOB: 05-01-1965 MRN: [id number]   Airway Documentation:    Vent end date: 04/11/22 Vent end time: 1013   Evaluation  O2 sats: stable throughout Complications: No apparent complications Patient did tolerate procedure well. Bilateral Breath Sounds: Clear   Yes  Pt extubated to 3L  per MD order. Positive cuff leak noted prior to extubation. Pt able to speak and has a strong cough post extubation. Pt encouraged to use Y[place]auer to clear secretions.  Jesse Sans 04/11/2022, 10:13 AM

## 2022-04-11 NOTE — Evaluation (Signed)
Physical Therapy Evaluation Patient Details Name: Patricia [name] MRN: [id number] DOB: August 21, 1965 Today's Date: 04/11/2022  History of Present Illness  Patricia [name] is a 57 y.o. female who presented after sudden onset of L arm numbness. 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 S/P TNKase, and s/p mechanical thrombectomy to occluded R MCA M1 and R ICA proximal stent. 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 Extubated 2/12. PMHx: smoking, hypertension, asthma.   Clinical Impression  Pt admitted with above. Pt presenting with slow processing and mild L UE and LE weakness with numbness however was able to ambulate and transfer with minA this date. Suspect pt to progress well and be able to d/c home with mother and HHPT once medically stable. Acute PT to cont to follow.       Recommendations for follow up therapy are one component of a multi-disciplinary discharge planning process, led by the attending physician.  Recommendations may be updated based on patient status, additional functional criteria and insurance authorization.  Follow Up Recommendations Home health PT      Assistance Recommended at Discharge Intermittent Supervision/Assistance  Patient can return home with the following       Equipment Recommendations None recommended by PT  Recommendations for Other Services       Functional Status Assessment Patient has had a recent decline in their functional status and demonstrates the ability to make significant improvements in function in a reasonable and predictable amount of time.     Precautions / Restrictions Precautions Precautions: Fall Restrictions Weight Bearing Restrictions: No      Mobility  Bed Mobility Overal bed mobility: Needs Assistance Bed Mobility: Supine to Sit     Supine to sit: Min assist     General bed mobility comments: with increased time pt initiated transfer to EOB     Transfers Overall transfer level: Needs assistance Equipment used: None Transfers: Sit to/from Stand Sit to Stand: Min assist, Min guard           General transfer comment: for safety/balance, mild unsteadiness, slow guarded    Ambulation/Gait Ambulation/Gait assistance: Min assist Gait Distance (Feet): 150 Feet Assistive device: 1 person hand held assist Gait Pattern/deviations: Step-through pattern, Decreased stride length Gait velocity: dec Gait velocity interpretation: <1.31 ft/sec, indicative of household ambulator   General Gait Details: initially slow and guarded as first time up since admission and being extubated progressing to more fluid reciprocal gait pattern, no overt LOB  Stairs            Wheelchair Mobility    Modified Rankin (Stroke Patients Only) Modified Rankin (Stroke Patients Only) Pre-Morbid Rankin Score: No symptoms Modified Rankin: Slight disability     Balance Overall balance assessment: Needs assistance Sitting-balance support: No upper extremity supported, Feet supported Sitting balance-Leahy Scale: Fair     Standing balance support: No upper extremity supported, During functional activity Standing balance-Leahy Scale: Fair                               Pertinent Vitals/Pain Pain Assessment Pain Assessment: Faces Faces Pain Scale: Hurts little more Pain Location: R groin at IR site Pain Descriptors / Indicators: Sore Pain Intervention(s): Limited activity within patient's tolerance    Home Living Family/patient expects to be discharged to:: Private residence Living Arrangements: Children (mother) Available Help at Discharge: Family;Available 24 hours/day Type of Home:  House Home Access: Stairs to enter Entrance Stairs-Rails: Can reach both Entrance Stairs-Number of Steps: 3-5   Home Layout: One level Home Equipment: Advice worker (2 wheels);Hand held shower head (raised toilet seat)       Prior Function Prior Level of Function : Independent/Modified Independent;Working/employed             Mobility Comments: indep, Surveyor, minerals, driving ADLs Comments: indep     Hand Dominance   Dominant Hand: Right    Extremity/Trunk Assessment   Upper Extremity Assessment Upper Extremity Assessment: Defer to OT evaluation LUE Deficits / Details: grossly 3+/5 MMT, numbness in UE and decreased [place] LUE Sensation: decreased light touch LUE Coordination: decreased fine motor    Lower Extremity Assessment Lower Extremity Assessment: LLE deficits/detail LLE Deficits / Details: grossly 4-/5, decreased light touch, sensation of feeling heavy    Cervical / Trunk Assessment Cervical / Trunk Assessment: Normal  Communication   Communication: No difficulties (soft spoken)  Cognition Arousal/Alertness: Awake/alert Behavior During Therapy: WFL for tasks assessed/performed Overall Cognitive Status: Impaired/Different from baseline Area of Impairment: Problem solving                             Problem Solving: Slow processing, Difficulty sequencing, Requires verbal cues General Comments: pt initially sleeping but awoken easily and tended to tasks well. Pt with noted delayed processing but aware of deficits        General Comments General comments (skin integrity, edema, etc.): VSS, pt assisted to bathroom, +VOID, informed RN    Exercises     Assessment/Plan    PT Assessment Patient needs continued PT services  PT Problem List Decreased strength;Decreased activity tolerance;Decreased balance;Decreased mobility       PT Treatment Interventions DME instruction;Gait training;Stair training;Functional mobility training;Therapeutic activities;Therapeutic exercise;Balance training;Neuromuscular re-education    PT Goals (Current goals can be found in the Care Plan section)  Acute Rehab PT Goals Patient Stated Goal: home PT Goal Formulation: With patient Time For  Goal Achievement: 04/25/22 Potential to Achieve Goals: Good Additional Goals Additional Goal #1: Pt to score >19 on DGI to indicate minimal falls risk.    Frequency Min 4X/week     Co-evaluation PT/OT/SLP Co-Evaluation/Treatment: Yes Reason for Co-Treatment: To address functional/ADL transfers;Other (comment) (activity tolerance) PT goals addressed during session: Mobility/safety with mobility OT goals addressed during session: ADL's and self-care       AM-PAC PT "6 Clicks" Mobility  Outcome Measure Help needed turning from your back to your side while in a flat bed without using bedrails?: A Little Help needed moving from lying on your back to sitting on the side of a flat bed without using bedrails?: A Little Help needed moving to and from a bed to a chair (including a wheelchair)?: A Little Help needed standing up from a chair using your arms (e.g., wheelchair or bedside chair)?: A Little Help needed to walk in hospital room?: A Little Help needed climbing 3-5 steps with a railing? : A Little 6 Click Score: 18    End of Session Equipment Utilized During Treatment: Gait belt Activity Tolerance: Patient tolerated treatment well Patient left: in chair;with chair alarm set;with family/visitor present Nurse Communication: Mobility status PT Visit Diagnosis: Unsteadiness on feet (R26.81);Difficulty in walking, not elsewhere classified (R26.2)    Time: [id number] PT Time Calculation (min) (ACUTE ONLY): 33 min   Charges:   PT Evaluation $PT Eval Moderate Complexity: 1 Mod  Kittie Plater, PT, DPT Acute Rehabilitation Services Secure chat preferred Office #: [phone]   Berline Lopes 04/11/2022, 3:11 PM

## 2022-04-11 NOTE — Progress Notes (Signed)
Pt voided 250 mL around 2100.  Pt bladder scanned at 0100.  Bladder scan read 793 mL.  Hardin notified.  Awaiting orders.

## 2022-04-11 NOTE — Evaluation (Signed)
Occupational Therapy Evaluation Patient Details Name: Patricia [name] MRN: [id number] DOB: 08-Sep-1965 Today's Date: 04/11/2022   History of Present Illness Patricia [name] is a 57 y.o. female who presented after sudden onset of L arm numbness. 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 S/P TNKase, and s/p mechanical thrombectomy to occluded R MCA M1 and R ICA proximal stent. 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 Extubated 2/12. PMHx: smoking, hypertension, asthma.   Clinical Impression   PTA patient independent and working. Admitted for above and presents with problem list below.  Pt fatigued, but following commands well.  Some slow processing and decreased sequencing, cueing for safety; will continue to assess cognition.  Pt completing Adls with up to min assist, transfers and mobility with 0-1 hand held assist with min assist to min guard.  Pt has good support at home and anticipate she will progress well.  Will follow acutely with recommendation for HHOT services at dc.       Recommendations for follow up therapy are one component of a multi-disciplinary discharge planning process, led by the attending physician.  Recommendations may be updated based on patient status, additional functional criteria and insurance authorization.   Follow Up Recommendations  Home health OT (may progress to outpatient)     Assistance Recommended at Discharge Frequent or constant Supervision/Assistance  Patient can return home with the following A little help with walking and/or transfers;A little help with bathing/dressing/bathroom;Assistance with cooking/housework;Direct supervision/assist for medications management;Direct supervision/assist for financial management;Assist for transportation;Help with stairs or ramp for entrance    Functional Status Assessment  Patient has had a recent decline in their functional status and  demonstrates the ability to make significant improvements in function in a reasonable and predictable amount of time.  Equipment Recommendations  None recommended by OT    Recommendations for Other Services       Precautions / Restrictions Precautions Precautions: Fall Restrictions Weight Bearing Restrictions: No      Mobility Bed Mobility Overal bed mobility: Needs Assistance Bed Mobility: Supine to Sit     Supine to sit: Min assist     General bed mobility comments: with increased time pt initiated transfer to EOB    Transfers Overall transfer level: Needs assistance Equipment used: None Transfers: Sit to/from Stand Sit to Stand: Min assist, Min guard           General transfer comment: for safety/balance, mild unsteadiness      Balance Overall balance assessment: Needs assistance Sitting-balance support: No upper extremity supported, Feet supported Sitting balance-Leahy Scale: Fair     Standing balance support: No upper extremity supported, During functional activity Standing balance-Leahy Scale: Fair                             ADL either performed or assessed with clinical judgement   ADL Overall ADL's : Needs assistance/impaired     Grooming: Min guard;Standing;Wash/dry hands           Upper Body Dressing : Minimal assistance;Sitting   Lower Body Dressing: Sit to/from stand;Minimal assistance   Toilet Transfer: Minimal assistance;Ambulation;Grab bars   Toileting- Clothing Manipulation and Hygiene: Supervision/safety;Sitting/lateral lean       Functional mobility during ADLs: Minimal assistance;Min guard;Cueing for safety       Vision Baseline Vision/History: 1 Wears glasses Patient Visual Report: Blurring of vision Additional Comments: appears WFL,  able to read clock and room numbers. wears glasses as baseline for distance and glasses not present     Perception     Praxis      Pertinent Vitals/Pain Pain  Assessment Pain Assessment: Faces Faces Pain Scale: Hurts little more Pain Location: R groin at IR site Pain Descriptors / Indicators: Sore Pain Intervention(s): Limited activity within patient's tolerance, Monitored during session, Repositioned     Hand Dominance Right   Extremity/Trunk Assessment Upper Extremity Assessment Upper Extremity Assessment: LUE deficits/detail LUE Deficits / Details: grossly 3+/5 MMT, numbness in UE and decreased [place] LUE Sensation: decreased light touch LUE Coordination: decreased fine motor   Lower Extremity Assessment Lower Extremity Assessment: Defer to PT evaluation LLE Deficits / Details: grossly 4-/5, decreased light touch, sensation of feeling heavy   Cervical / Trunk Assessment Cervical / Trunk Assessment: Normal   Communication Communication Communication: No difficulties (soft spoken)   Cognition Arousal/Alertness: Awake/alert Behavior During Therapy: WFL for tasks assessed/performed Overall Cognitive Status: Impaired/Different from baseline Area of Impairment: Problem solving                             Problem Solving: Slow processing, Difficulty sequencing, Requires verbal cues General Comments: pt initially sleeping but awoken easily and tended to tasks well. Pt with noted delayed processing but aware of deficits, mild difficulty sequencing counting backwards (missing 2 numbers) and further cog assessment recommended.  guidance for safety throughout session     General Comments  vss    Exercises     Shoulder Instructions      Home Living Family/patient expects to be discharged to:: Private residence Living Arrangements: Children (mother) Available Help at Discharge: Family;Available 24 hours/day Type of Home: House Home Access: Stairs to enter [employer] of Steps: 3-5 Entrance Stairs-Rails: Can reach both Home Layout: One level     Bathroom Shower/Tub: Occupational psychologist:  Standard     Home Equipment: Advice worker (2 wheels);Hand held shower head (raised toilet seat)          Prior Functioning/Environment Prior Level of Function : Independent/Modified Independent;Working/employed             Mobility Comments: indep, nurses aide, driving ADLs Comments: indep        OT Problem List: Decreased strength;Decreased activity tolerance;Impaired balance (sitting and/or standing);Decreased cognition;Decreased safety awareness;Decreased knowledge of use of DME or AE;Decreased knowledge of precautions;Cardiopulmonary status limiting activity;Decreased coordination;Impaired sensation;Impaired UE functional use      OT Treatment/Interventions: Self-care/ADL training;Neuromuscular education;DME and/or AE instruction;Therapeutic activities;Cognitive remediation/compensation;Patient/family education;Balance training    OT Goals(Current goals can be found in the care plan section) Acute Rehab OT Goals Patient Stated Goal: feel better OT Goal Formulation: With patient Time For Goal Achievement: 04/25/22 Potential to Achieve Goals: Good  OT Frequency: Min 2X/week    Co-evaluation PT/OT/SLP Co-Evaluation/Treatment: Yes Reason for Co-Treatment: To address functional/ADL transfers;Other (comment) (activity tolerance)   OT goals addressed during session: ADL's and self-care      AM-PAC OT "6 Clicks" Daily Activity     Outcome Measure Help from another person eating meals?: A Little Help from another person taking care of personal grooming?: A Little Help from another person toileting, which includes using toliet, bedpan, or urinal?: A Little Help from another person bathing (including washing, rinsing, drying)?: A Little Help from another person to put on and taking off regular upper body clothing?: A Little Help from another  person to put on and taking off regular lower body clothing?: A Little 6 Click Score: 18   End of Session Equipment  Utilized During Treatment: Gait belt Nurse Communication: Mobility status  Activity Tolerance: Patient tolerated treatment well Patient left: in chair;with call bell/phone within reach;with chair alarm set;with family/visitor present  OT Visit Diagnosis: Other abnormalities of gait and mobility (R26.89);Muscle weakness (generalized) (M62.81);Other symptoms and signs involving the nervous system (R29.898)                Time: [id number] OT Time Calculation (min): 29 min Charges:  OT General Charges $OT Visit: 1 Visit OT Evaluation $OT Eval Moderate Complexity: 1 Mod  Jolaine Artist, OT Acute Rehabilitation Services Office [phone]   Delight Stare 04/11/2022, 1:29 PM

## 2022-04-12 ENCOUNTER — Other Ambulatory Visit (HOSPITAL_COMMUNITY): Payer: Self-pay

## 2022-04-12 ENCOUNTER — Other Ambulatory Visit: Payer: Self-pay | Admitting: Radiology

## 2022-04-12 DIAGNOSIS — I63511 Cerebral infarction due to unspecified occlusion or stenosis of right middle cerebral artery: Secondary | ICD-10-CM

## 2022-04-12 MED ORDER — TICAGRELOR 90 MG PO TABS
90.0000 mg | ORAL_TABLET | Freq: Two times a day (BID) | ORAL | 2 refills | Status: DC
  Filled 2022-04-12: qty 60, 30d supply, fill #0

## 2022-04-12 MED ORDER — TICAGRELOR 90 MG PO TABS
90.0000 mg | ORAL_TABLET | Freq: Two times a day (BID) | ORAL | 0 refills | Status: DC
  Filled 2022-04-12: qty 60, 30d supply, fill #0

## 2022-04-12 MED ORDER — CLOPIDOGREL BISULFATE 75 MG PO TABS: 75.0000 mg | ORAL_TABLET | Freq: Every day | ORAL | Status: DC

## 2022-04-12 MED ORDER — ASPIRIN 81 MG PO CHEW
81.0000 mg | CHEWABLE_TABLET | Freq: Every day | ORAL | 2 refills | Status: AC
  Filled 2022-04-12: qty 30, 30d supply, fill #0

## 2022-04-12 MED ORDER — ROSUVASTATIN CALCIUM 20 MG PO TABS
20.0000 mg | ORAL_TABLET | Freq: Every day | ORAL | 2 refills | Status: AC
  Filled 2022-04-12: qty 30, 30d supply, fill #0

## 2022-04-12 MED ORDER — CLOPIDOGREL BISULFATE 75 MG PO TABS
75.0000 mg | ORAL_TABLET | Freq: Every day | ORAL | 1 refills | Status: DC
  Filled 2022-04-12: qty 30, 30d supply, fill #0

## 2022-04-12 NOTE — Progress Notes (Signed)
NAME:  Patricia [name], MRN:  [id number], DOB:  December 17, 1965, LOS: 3 ADMISSION DATE:  04/09/2022, CONSULTATION DATE:  04/10/22 REFERRING MD:  Patrecia Pour , CHIEF COMPLAINT:  stroke    History of Present Illness:  57 yo female smoker developed sudden onset of Lt arm numbness.  Code stroke activated for Rt MCA syndrome.  Seen by neurology in ER and treated with thrombolytic.  Found to have occluded Rt MCA M1 and had mechanical thrombectomy and stent to proximal Rt ICA.  Pertinent  Medical History  Asthma, HTN  Significant Hospital Events: Including procedures, antibiotic start and stop dates in addition to other pertinent events   2/10 admit, TNKase, mechanical thrombectomy to Rt M1 and stent to Rt ICA Echocardiogram 2/11 LVEF 60-65%, normal RV size and function Vascular ultrasound right lower extremity 2/12 without any evidence of right groin pseudoaneurysm or AVF.  Interim History / Subjective:  Extubated 2/12 No infusions   Objective   Blood pressure 132/86, pulse 77, temperature 98.6 F (37 C), resp. rate 12, height 5' 5"$  (1.651 m), weight 85 kg, SpO2 99 %.        Intake/Output Summary (Last 24 hours) at 04/12/2022 0825 Last data filed at 04/11/2022 1800 Gross per 24 hour  Intake 901.71 ml  Output 1300 ml  Net -398.29 ml   Filed Weights   04/09/22 2100 04/11/22 0600  Weight: 80.2 kg 85 kg    Examination:  General -up to chair, eating HEENT-strong voice, no secretions Cardiac -regular, no murmur Chest -clear bilateral Abdomen -nondistended, positive bowel sounds Extremities -no edema Skin -no rash Neuro -awake, alert, interacting appropriately, follows commands, slightly weaker on the left than the right  Resolved Hospital Problem list     Assessment & Plan:   Acute Rt MCA ischemic stroke with M1 occlusion s/p TNKase, mechanical thrombectomy and stent to Rt ICA. P: -Neurology management post CVA -Aspirin, rosuvastatin.  Complete Brilinta  Possible Rt femoral  artery pseudoaneurysm. P: -Ultrasound without any evidence of pseudoaneurysm 2/12  Compromised airway. Hx of asthma. P: -Push pulmonary hygiene -Albuterol available if needed  Hx of HTN, HLD. -Continue statin -Reinitiate blood pressure regimen when able: Amlodipine, HCTZ, nifedipine  Hypokalemia. -Follow BMP  Best Practice (right click and "Reselect all SmartList Selections" daily)   Diet/type: NPO DVT prophylaxis: SCD GI prophylaxis: PPI Lines: N/A Foley:  N/A Code Status:  full code Last date of multidisciplinary goals of care discussion []$   Labs       Latest Ref Rng & Units 04/11/2022    5:18 AM 04/10/2022    9:11 PM 04/10/2022    4:24 AM  CMP  Glucose 70 - 99 mg/dL 104   173   BUN 6 - 20 mg/dL 9   11   Creatinine 0.44 - 1.00 mg/dL 0.94   0.87   Sodium 135 - 145 mmol/L 139   138   Potassium 3.5 - 5.1 mmol/L 3.3  3.8  3.0   Chloride 98 - 111 mmol/L 110   104   CO2 22 - 32 mmol/L 22   24   Calcium 8.9 - 10.3 mg/dL 7.9   8.2        Latest Ref Rng & Units 04/11/2022    5:18 AM 04/10/2022    4:24 AM 04/09/2022   10:13 PM  CBC  WBC 4.0 - 10.5 K/uL 9.7  9.1    Hemoglobin 12.0 - 15.0 g/dL 9.7  11.5  11.9   Hematocrit 36.0 - 46.0 %  28.3  31.7  35.0   Platelets 150 - 400 K/uL 180  247      ABG    Component Value Date/Time   TCO2 29 04/09/2022 2213    CBG (last 3)  Recent Labs    04/09/22 2152  GLUCAP 112*     Baltazar Apo, MD, PhD 04/12/2022, 8:25 AM [facility] [phone] or if no answer before 7:00PM call [phone] For any issues after 7:00PM please call eLink [phone]

## 2022-04-12 NOTE — Discharge Summary (Addendum)
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   aspirin 81 MG chewable tablet Chew 1 tablet (81 mg total) by mouth daily. Start taking on: April 13, 2022   citalopram 20 MG tablet Commonly known as: CELEXA Take 20 mg by mouth at bedtime.   hydrochlorothiazide 25 MG tablet Commonly known as: HYDRODIURIL Take 25 mg by mouth daily.   NIFEdipine 30 MG 24 hr tablet Commonly known as: ADALAT CC Take 30 mg by mouth daily.   rosuvastatin 20 MG tablet Commonly known as: CRESTOR Take 1 tablet (20 mg total) by mouth daily. Start taking on: April 13, 2022   ticagrelor 90 MG Tabs tablet Commonly known as: BRILINTA Take 1 tablet (90 mg total) by mouth 2 (two) times daily.        LABORATORY STUDIES CBC    Component Value Date/Time   WBC 9.7 04/11/2022 0518   RBC 2.84 (L) 04/11/2022 0518   HGB 9.7 (L)  04/11/2022 0518   HGB 12.7 03/19/2014 1028   HCT 28.3 (L) 04/11/2022 0518   HCT 37.8 03/19/2014 1028   PLT 180 04/11/2022 0518   PLT 191 03/19/2014 1028   MCV 99.6 04/11/2022 0518   MCV 99 03/19/2014 1028   MCH 34.2 (H) 04/11/2022 0518   MCHC 34.3 04/11/2022 0518   RDW 12.2 04/11/2022 0518   RDW 12.3 03/19/2014 1028   LYMPHSABS 0.8 04/10/2022 0424   LYMPHSABS 2.7 03/19/2014 1028   MONOABS 0.1 04/10/2022 0424   MONOABS 0.5 03/19/2014 1028   EOSABS 0.0 04/10/2022 0424   EOSABS 0.1 03/19/2014 1028   BASOSABS 0.0 04/10/2022 0424   BASOSABS 0.0 03/19/2014 1028   CMP    Component Value Date/Time   NA 139 04/11/2022 0518   NA 141 03/19/2014 1028   K 3.3 (L) 04/11/2022 0518   K 3.8 03/19/2014 1028   CL 110 04/11/2022 0518   CL 109 (H) 03/19/2014 1028   CO2 22 04/11/2022 0518   CO2 29 03/19/2014 1028   GLUCOSE 104 (H) 04/11/2022 0518   GLUCOSE 89 03/19/2014 1028   BUN 9 04/11/2022 0518   BUN 10 03/19/2014 1028   CREATININE 0.94 04/11/2022 0518   CREATININE 0.80 03/19/2014 1028   CALCIUM 7.9 (L) 04/11/2022 0518   CALCIUM 8.7 03/19/2014 1028   PROT 6.6 04/09/2022  2150   PROT 8.0 09/18/2012 1620   ALBUMIN 3.8 04/09/2022 2150   ALBUMIN 4.0 09/18/2012 1620   AST 25 04/09/2022 2150   AST 25 09/18/2012 1620   ALT 11 04/09/2022 2150   ALT 11 (L) 09/18/2012 1620   ALKPHOS 84 04/09/2022 2150   ALKPHOS 126 09/18/2012 1620   BILITOT 0.7 04/09/2022 2150   BILITOT 0.4 09/18/2012 1620   GFRNONAA >60 04/11/2022 0518   GFRNONAA >60 03/19/2014 1028   GFRNONAA >60 09/18/2012 1620   GFRAA >60 10/30/2019 1516   GFRAA >60 03/19/2014 1028   GFRAA >60 09/18/2012 1620   COAGS Lab Results  Component Value Date   INR 0.9 04/09/2022   Lipid Panel    Component Value Date/Time   CHOL 132 04/10/2022 0424   TRIG 154 (H) 04/11/2022 0518   HDL 33 (L) 04/10/2022 0424   CHOLHDL 4.0 04/10/2022 0424   VLDL 18 04/10/2022 0424   LDLCALC 81 04/10/2022 0424   HgbA1C  Lab Results  Component  Value Date   HGBA1C 5.1 04/09/2022   Urinalysis    Component Value Date/Time   COLORURINE YELLOW (A) 04/04/2018 0631   APPEARANCEUR CLEAR (A) 04/04/2018 0631   APPEARANCEUR Clear 03/19/2014 0908   LABSPEC 1.019 04/04/2018 0631   LABSPEC 1.020 03/19/2014 0908   PHURINE 6.0 04/04/2018 0631   GLUCOSEU NEGATIVE 04/04/2018 0631   GLUCOSEU Negative 03/19/2014 0908   HGBUR MODERATE (A) 04/04/2018 0631   BILIRUBINUR NEGATIVE 04/04/2018 0631   BILIRUBINUR Negative 03/19/2014 0908   KETONESUR NEGATIVE 04/04/2018 0631   PROTEINUR NEGATIVE 04/04/2018 0631   NITRITE NEGATIVE 04/04/2018 0631   LEUKOCYTESUR NEGATIVE 04/04/2018 0631   LEUKOCYTESUR Negative 03/19/2014 0908   Urine Drug Screen     Component Value Date/Time   LABOPIA NONE DETECTED 04/10/2022 0813   COCAINSCRNUR NONE DETECTED 04/10/2022 0813   LABBENZ NONE DETECTED 04/10/2022 0813   AMPHETMU NONE DETECTED 04/10/2022 0813   THCU POSITIVE (A) 04/10/2022 0813   LABBARB NONE DETECTED 04/10/2022 0813    Alcohol Level    Component Value Date/Time   ETH <10 04/10/2022 0424     SIGNIFICANT DIAGNOSTIC STUDIES IR PERCUTANEOUS ART THROMBECTOMY/INFUSION INTRACRANIAL INC DIAG ANGIO  Result Date: 04/12/2022 INDICATION: Acute onset of left-sided weakness, neglect and right-sided gaze deviation. CT angiogram reveals occluded right middle cerebral artery with a prominent smooth filling defect with a 50-55% stenosis. EXAM: 1. EMERGENT LARGE VESSEL OCCLUSION THROMBOLYSIS (anterior CIRCULATION) COMPARISON:  CT angiogram of the head and neck of April 09, 2022. MEDICATIONS: Ancef 2 g IV antibiotic was administered within 1 hour of the procedure. ANESTHESIA/SEDATION: General anesthesia. CONTRAST:  Omnipaque 300 90 cc. FLUOROSCOPY TIME:  Fluoroscopy Time: 19 minutes 0 seconds (1054 mGy). COMPLICATIONS: None immediate. TECHNIQUE: Following a full explanation of the procedure along with the potential associated complications, an informed witnessed  consent was obtained. The risks of intracranial hemorrhage of 10%, worsening neurological deficit, ventilator dependency, death and inability to revascularize were all reviewed in detail with the patient's mother. The patient was then put under general anesthesia by the Department of Anesthesiology at [facility]. The right groin was prepped and draped in the usual sterile fashion. Thereafter using modified Seldinger technique, transfemoral access into the right common femoral artery was obtained without difficulty. Over an 0.035 inch guidewire a combination of a 5.5 French 125 cm support catheter inside of an 087 90 cm balloon guide catheter was advanced to the aortic arch region, and selectively positioned just proximal to the right  internal carotid artery. The guidewire, and the support catheter were removed. Good aspiration obtained from the hub of the balloon guide catheter. A gentle control arteriogram performed centered extra cranially and intracranially. FINDINGS: The right external carotid artery and its major branches demonstrate wide patency. The right internal carotid artery at the bulb demonstrates a prominent smooth lobulated filling defect with narrowing of approximately 70% by the NASCET criteria. More distally, the right internal carotid artery is seen to ascend normally to the cranial skull base. The petrous, the cavernous and the supraclinoid right ICA demonstrate wide patency. A prominent posterior communicating artery is seen opacifying the right posterior cerebral artery distribution. 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 The right anterior cerebral artery opacifies into the capillary and venous phases. PROCEDURE: Through the balloon guide catheter at the bulb, an 070 aspiration catheter with its Tenzing support catheter were navigated under constant fluoroscopic guidance without any difficulty to the supraclinoid right ICA. The combination was then  advanced through the occluded right middle cerebral artery with the 070 aspiration imbedded in the occluded right M1 segment. The Tenzing cm support catheter was removed. Aspiration was then applied at the hub of the 070 cm aspiration catheter for a minute and a half with proximal flow arrest. The aspiration catheter was removed. Two large clots were identified and the canister. Following reversal of flow arrest, a control arteriogram performed through the balloon guide catheter demonstrated complete revascularization of right MCA distribution achieving a TICI 3 revascularization. The right anterior cerebral artery and the posterior communicating artery remained widely patent. An exchange 300 cm 014 inch Zoom soft tip micro guidewire was then advanced through the balloon guide catheter in the mid M1 segment with widening advanced to the horizontal petrous segment. The balloon guide was then gently retrieved to just proximal to the bifurcation. A control arteriogram performed through this the prominent smooth shelf-like filling defect extending circumferentially and also into the lumen with a significant stenosis. Measurements were performed of the internal carotid artery distal to the bulb and proximally the distal right common carotid artery. A 6 mm x 8 mm/4 cm Xact stent delivery system was then prepped and purged retrogradely with heparinized saline infusion. Using the rapid exchange technique, this was advanced without difficulty over the exchange micro guidewire and positioned such that the distal and proximal markers were at the desired locations. Stent was then deployed in the usual manner without difficulty. The delivery apparatus was retrieved and removed. A control arteriogram performed through the balloon guide catheter in the right common carotid artery demonstrated excellent apposition with significantly improved caliber at the bulb. More distally, the right MCA distribution continued to demonstrated a  TICI 3 revascularization. Arteriograms were then performed at 10 and 20 minutes post deployment of the stent. These continued to demonstrate excellent apposition of the stent proximally, with patency of the right MCA distribution. A final control arteriogram was then performed through the balloon guide catheter after removal of the exchange micro guidewire. Patency of the stent and apposition was maintained. No evidence of intra stent irregularities evident angiographically. Distally the right MCA and the right anterior cerebral artery and the right posterior communicating artery demonstrated wide patency. The balloon guide catheter was removed. An 8 French Angio-Seal closure device was then deployed at the right groin puncture site with hemostasis. Distal pulses remained palpable in both feet unchanged. A flat panel CT of the brain demonstrated no evidence of hemorrhagic complications. Additionally, manual compression  with quick clot was applied for 30 minutes in view of slight contrast extravasation noted at the right common femoral artery puncture site. Patient was left intubated and transferred to the ICU for post revascularization care. Patient was loaded with aspirin 81 mg and Brilinta 180 mg prior to the placement of the stent. Additionally, the patient was given a bolus dose of IV cangrelor followed by a 4 hour infusion, which was stopped after infusion of quarter of the a dose due to the right common femoral slight contrast extravasation. IMPRESSION: Status post complete revascularization of occluded right M1 segment with 1 pass with an 070 FreeClimb aspiration catheter flow arrest achieving a TICI 3 revascularization. 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 PLAN: Follow-up in clinic 2 weeks post discharge. Electronically Signed   By: Luanne Bras M.D.   On: 04/12/2022 08:27   IR CT Head Ltd  Result  Date: 04/12/2022 INDICATION: Acute onset of left-sided weakness, neglect and right-sided gaze deviation. CT angiogram reveals occluded right middle cerebral artery with a prominent smooth filling defect with a 50-55% stenosis. EXAM: 1. EMERGENT LARGE VESSEL OCCLUSION THROMBOLYSIS (anterior CIRCULATION) COMPARISON:  CT angiogram of the head and neck of April 09, 2022. MEDICATIONS: Ancef 2 g IV antibiotic was administered within 1 hour of the procedure. ANESTHESIA/SEDATION: General anesthesia. CONTRAST:  Omnipaque 300 90 cc. FLUOROSCOPY TIME:  Fluoroscopy Time: 19 minutes 0 seconds (1054 mGy). COMPLICATIONS: None immediate. TECHNIQUE: Following a full explanation of the procedure along with the potential associated complications, an informed witnessed consent was obtained. The risks of intracranial hemorrhage of 10%, worsening neurological deficit, ventilator dependency, death and inability to revascularize were all reviewed in detail with the patient's mother. The patient was then put under general anesthesia by the Department of Anesthesiology at [facility]. The right groin was prepped and draped in the usual sterile fashion. Thereafter using modified Seldinger technique, transfemoral access into the right common femoral artery was obtained without difficulty. Over an 0.035 inch guidewire a combination of a 5.5 French 125 cm support catheter inside of an 087 90 cm balloon guide catheter was advanced to the aortic arch region, and selectively positioned just proximal to the right internal carotid artery. The guidewire, and the support catheter were removed. Good aspiration obtained from the hub of the balloon guide catheter. A gentle control arteriogram performed centered extra cranially and intracranially. FINDINGS: The right external carotid artery and its major branches demonstrate wide patency. The right internal carotid artery at the bulb demonstrates a prominent smooth lobulated filling defect with  narrowing of approximately 70% by the NASCET criteria. More distally, the right internal carotid artery is seen to ascend normally to the cranial skull base. The petrous, the cavernous and the supraclinoid right ICA demonstrate wide patency. A prominent posterior communicating artery is seen opacifying the right posterior cerebral artery distribution. 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 The right anterior cerebral artery opacifies into the capillary and venous phases. PROCEDURE: Through the balloon guide catheter at the bulb, an 070 aspiration catheter with its Tenzing support catheter were navigated under constant fluoroscopic guidance without any difficulty to the supraclinoid right ICA. The combination was then advanced through the occluded right middle cerebral artery with the 070 aspiration imbedded in the occluded right M1 segment. The Tenzing cm support catheter was removed. Aspiration was then applied at the hub of the 070  cm aspiration catheter for a minute and a half with proximal flow arrest. The aspiration catheter was removed. Two large clots were identified and the canister. Following reversal of flow arrest, a control arteriogram performed through the balloon guide catheter demonstrated complete revascularization of right MCA distribution achieving a TICI 3 revascularization. The right anterior cerebral artery and the posterior communicating artery remained widely patent. An exchange 300 cm 014 inch Zoom soft tip micro guidewire was then advanced through the balloon guide catheter in the mid M1 segment with widening advanced to the horizontal petrous segment. The balloon guide was then gently retrieved to just proximal to the bifurcation. A control arteriogram performed through this the prominent smooth shelf-like filling defect extending circumferentially and also into the lumen with a significant stenosis. Measurements were performed of the internal carotid  artery distal to the bulb and proximally the distal right common carotid artery. A 6 mm x 8 mm/4 cm Xact stent delivery system was then prepped and purged retrogradely with heparinized saline infusion. Using the rapid exchange technique, this was advanced without difficulty over the exchange micro guidewire and positioned such that the distal and proximal markers were at the desired locations. Stent was then deployed in the usual manner without difficulty. The delivery apparatus was retrieved and removed. A control arteriogram performed through the balloon guide catheter in the right common carotid artery demonstrated excellent apposition with significantly improved caliber at the bulb. More distally, the right MCA distribution continued to demonstrated a TICI 3 revascularization. Arteriograms were then performed at 10 and 20 minutes post deployment of the stent. These continued to demonstrate excellent apposition of the stent proximally, with patency of the right MCA distribution. A final control arteriogram was then performed through the balloon guide catheter after removal of the exchange micro guidewire. Patency of the stent and apposition was maintained. No evidence of intra stent irregularities evident angiographically. Distally the right MCA and the right anterior cerebral artery and the right posterior communicating artery demonstrated wide patency. The balloon guide catheter was removed. An 8 French Angio-Seal closure device was then deployed at the right groin puncture site with hemostasis. Distal pulses remained palpable in both feet unchanged. A flat panel CT of the brain demonstrated no evidence of hemorrhagic complications. Additionally, manual compression with quick clot was applied for 30 minutes in view of slight contrast extravasation noted at the right common femoral artery puncture site. Patient was left intubated and transferred to the ICU for post revascularization care. Patient was loaded with  aspirin 81 mg and Brilinta 180 mg prior to the placement of the stent. Additionally, the patient was given a bolus dose of IV cangrelor followed by a 4 hour infusion, which was stopped after infusion of quarter of the a dose due to the right common femoral slight contrast extravasation. IMPRESSION: Status post complete revascularization of occluded right M1 segment with 1 pass with an 070 FreeClimb aspiration catheter flow arrest achieving a TICI 3 revascularization. 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 PLAN: Follow-up in clinic 2 weeks post discharge. Electronically Signed   By: Luanne Bras M.D.   On: 04/12/2022 08:27   IR INTRAVSC STENT CERV CAROTID W/O EMB-PROT MOD SED  Result Date: 04/12/2022 INDICATION: Acute onset of left-sided weakness, neglect and right-sided gaze deviation. CT angiogram reveals occluded right middle cerebral artery with a prominent smooth filling defect with a 50-55% stenosis. EXAM: 1. EMERGENT  LARGE VESSEL OCCLUSION THROMBOLYSIS (anterior CIRCULATION) COMPARISON:  CT angiogram of the head and neck of April 09, 2022. MEDICATIONS: Ancef 2 g IV antibiotic was administered within 1 hour of the procedure. ANESTHESIA/SEDATION: General anesthesia. CONTRAST:  Omnipaque 300 90 cc. FLUOROSCOPY TIME:  Fluoroscopy Time: 19 minutes 0 seconds (1054 mGy). COMPLICATIONS: None immediate. TECHNIQUE: Following a full explanation of the procedure along with the potential associated complications, an informed witnessed consent was obtained. The risks of intracranial hemorrhage of 10%, worsening neurological deficit, ventilator dependency, death and inability to revascularize were all reviewed in detail with the patient's mother. The patient was then put under general anesthesia by the Department of Anesthesiology at [facility]. The right groin was prepped and draped in the usual sterile  fashion. Thereafter using modified Seldinger technique, transfemoral access into the right common femoral artery was obtained without difficulty. Over an 0.035 inch guidewire a combination of a 5.5 French 125 cm support catheter inside of an 087 90 cm balloon guide catheter was advanced to the aortic arch region, and selectively positioned just proximal to the right internal carotid artery. The guidewire, and the support catheter were removed. Good aspiration obtained from the hub of the balloon guide catheter. A gentle control arteriogram performed centered extra cranially and intracranially. FINDINGS: The right external carotid artery and its major branches demonstrate wide patency. The right internal carotid artery at the bulb demonstrates a prominent smooth lobulated filling defect with narrowing of approximately 70% by the NASCET criteria. More distally, the right internal carotid artery is seen to ascend normally to the cranial skull base. The petrous, the cavernous and the supraclinoid right ICA demonstrate wide patency. A prominent posterior communicating artery is seen opacifying the right posterior cerebral artery distribution. 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 The right anterior cerebral artery opacifies into the capillary and venous phases. PROCEDURE: Through the balloon guide catheter at the bulb, an 070 aspiration catheter with its Tenzing support catheter were navigated under constant fluoroscopic guidance without any difficulty to the supraclinoid right ICA. The combination was then advanced through the occluded right middle cerebral artery with the 070 aspiration imbedded in the occluded right M1 segment. The Tenzing cm support catheter was removed. Aspiration was then applied at the hub of the 070 cm aspiration catheter for a minute and a half with proximal flow arrest. The aspiration catheter was removed. Two large clots were identified and the canister.  Following reversal of flow arrest, a control arteriogram performed through the balloon guide catheter demonstrated complete revascularization of right MCA distribution achieving a TICI 3 revascularization. The right anterior cerebral artery and the posterior communicating artery remained widely patent. An exchange 300 cm 014 inch Zoom soft tip micro guidewire was then advanced through the balloon guide catheter in the mid M1 segment with widening advanced to the horizontal petrous segment. The balloon guide was then gently retrieved to just proximal to the bifurcation. A control arteriogram performed through this the prominent smooth shelf-like filling defect extending circumferentially and also into the lumen with a significant stenosis. Measurements were performed of the internal carotid artery distal to the bulb and proximally the distal right common carotid artery. A 6 mm x 8 mm/4 cm Xact stent delivery system was then prepped and purged retrogradely with heparinized saline infusion. Using the rapid exchange technique, this was advanced without difficulty over the exchange micro guidewire and positioned such that the distal and proximal markers were at the desired locations.  Stent was then deployed in the usual manner without difficulty. The delivery apparatus was retrieved and removed. A control arteriogram performed through the balloon guide catheter in the right common carotid artery demonstrated excellent apposition with significantly improved caliber at the bulb. More distally, the right MCA distribution continued to demonstrated a TICI 3 revascularization. Arteriograms were then performed at 10 and 20 minutes post deployment of the stent. These continued to demonstrate excellent apposition of the stent proximally, with patency of the right MCA distribution. A final control arteriogram was then performed through the balloon guide catheter after removal of the exchange micro guidewire. Patency of the stent  and apposition was maintained. No evidence of intra stent irregularities evident angiographically. Distally the right MCA and the right anterior cerebral artery and the right posterior communicating artery demonstrated wide patency. The balloon guide catheter was removed. An 8 French Angio-Seal closure device was then deployed at the right groin puncture site with hemostasis. Distal pulses remained palpable in both feet unchanged. A flat panel CT of the brain demonstrated no evidence of hemorrhagic complications. Additionally, manual compression with quick clot was applied for 30 minutes in view of slight contrast extravasation noted at the right common femoral artery puncture site. Patient was left intubated and transferred to the ICU for post revascularization care. Patient was loaded with aspirin 81 mg and Brilinta 180 mg prior to the placement of the stent. Additionally, the patient was given a bolus dose of IV cangrelor followed by a 4 hour infusion, which was stopped after infusion of quarter of the a dose due to the right common femoral slight contrast extravasation. IMPRESSION: Status post complete revascularization of occluded right M1 segment with 1 pass with an 070 FreeClimb aspiration catheter flow arrest achieving a TICI 3 revascularization. 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 PLAN: Follow-up in clinic 2 weeks post discharge. Electronically Signed   By: Luanne Bras M.D.   On: 04/12/2022 08:27   IR CT Head Ltd  Result Date: 04/12/2022 INDICATION: Acute onset of left-sided weakness, neglect and right-sided gaze deviation. CT angiogram reveals occluded right middle cerebral artery with a prominent smooth filling defect with a 50-55% stenosis. EXAM: 1. EMERGENT LARGE VESSEL OCCLUSION THROMBOLYSIS (anterior CIRCULATION) COMPARISON:  CT angiogram of the head and neck of April 09, 2022.  MEDICATIONS: Ancef 2 g IV antibiotic was administered within 1 hour of the procedure. ANESTHESIA/SEDATION: General anesthesia. CONTRAST:  Omnipaque 300 90 cc. FLUOROSCOPY TIME:  Fluoroscopy Time: 19 minutes 0 seconds (1054 mGy). COMPLICATIONS: None immediate. TECHNIQUE: Following a full explanation of the procedure along with the potential associated complications, an informed witnessed consent was obtained. The risks of intracranial hemorrhage of 10%, worsening neurological deficit, ventilator dependency, death and inability to revascularize were all reviewed in detail with the patient's mother. The patient was then put under general anesthesia by the Department of Anesthesiology at [facility]. The right groin was prepped and draped in the usual sterile fashion. Thereafter using modified Seldinger technique, transfemoral access into the right common femoral artery was obtained without difficulty. Over an 0.035 inch guidewire a combination of a 5.5 French 125 cm support catheter inside of an 087 90 cm balloon guide catheter was advanced to the aortic arch region, and selectively positioned just proximal to the right internal carotid artery. The guidewire, and the support catheter were removed. Good aspiration obtained from the hub of the balloon guide  catheter. A gentle control arteriogram performed centered extra cranially and intracranially. FINDINGS: The right external carotid artery and its major branches demonstrate wide patency. The right internal carotid artery at the bulb demonstrates a prominent smooth lobulated filling defect with narrowing of approximately 70% by the NASCET criteria. More distally, the right internal carotid artery is seen to ascend normally to the cranial skull base. The petrous, the cavernous and the supraclinoid right ICA demonstrate wide patency. A prominent posterior communicating artery is seen opacifying the right posterior cerebral artery distribution. Complete occlusion  is seen of the right middle cerebral artery M1 segment. The right anterior cerebral artery opacifies into the capillary and venous phases. PROCEDURE: Through the balloon guide catheter at the bulb, an 070 aspiration catheter with its Tenzing support catheter were navigated under constant fluoroscopic guidance without any difficulty to the supraclinoid right ICA. The combination was then advanced through the occluded right middle cerebral artery with the 070 aspiration imbedded in the occluded right M1 segment. The Tenzing cm support catheter was removed. Aspiration was then applied at the hub of the 070 cm aspiration catheter for a minute and a half with proximal flow arrest. The aspiration catheter was removed. Two large clots were identified and the canister. Following reversal of flow arrest, a control arteriogram performed through the balloon guide catheter demonstrated complete revascularization of right MCA distribution achieving a TICI 3 revascularization. The right anterior cerebral artery and the posterior communicating artery remained widely patent. An exchange 300 cm 014 inch Zoom soft tip micro guidewire was then advanced through the balloon guide catheter in the mid M1 segment with widening advanced to the horizontal petrous segment. The balloon guide was then gently retrieved to just proximal to the bifurcation. A control arteriogram performed through this the prominent smooth shelf-like filling defect extending circumferentially and also into the lumen with a significant stenosis. Measurements were performed of the internal carotid artery distal to the bulb and proximally the distal right common carotid artery. A 6 mm x 8 mm/4 cm Xact stent delivery system was then prepped and purged retrogradely with heparinized saline infusion. Using the rapid exchange technique, this was advanced without difficulty over the exchange micro guidewire and positioned such that the distal and proximal markers were at  the desired locations. Stent was then deployed in the usual manner without difficulty. The delivery apparatus was retrieved and removed. A control arteriogram performed through the balloon guide catheter in the right common carotid artery demonstrated excellent apposition with significantly improved caliber at the bulb. More distally, the right MCA distribution continued to demonstrated a TICI 3 revascularization. Arteriograms were then performed at 10 and 20 minutes post deployment of the stent. These continued to demonstrate excellent apposition of the stent proximally, with patency of the right MCA distribution. A final control arteriogram was then performed through the balloon guide catheter after removal of the exchange micro guidewire. Patency of the stent and apposition was maintained. No evidence of intra stent irregularities evident angiographically. Distally the right MCA and the right anterior cerebral artery and the right posterior communicating artery demonstrated wide patency. The balloon guide catheter was removed. An 8 French Angio-Seal closure device was then deployed at the right groin puncture site with hemostasis. Distal pulses remained palpable in both feet unchanged. A flat panel CT of the brain demonstrated no evidence of hemorrhagic complications. Additionally, manual compression with quick clot was applied for 30 minutes in view of slight contrast extravasation noted at the right common femoral artery  puncture site. Patient was left intubated and transferred to the ICU for post revascularization care. Patient was loaded with aspirin 81 mg and Brilinta 180 mg prior to the placement of the stent. Additionally, the patient was given a bolus dose of IV cangrelor followed by a 4 hour infusion, which was stopped after infusion of quarter of the a dose due to the right common femoral slight contrast extravasation. IMPRESSION: Status post complete revascularization of occluded right M1 segment with  1 pass with an 070 FreeClimb aspiration catheter flow arrest achieving a TICI 3 revascularization. 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 PLAN: Follow-up in clinic 2 weeks post discharge. Electronically Signed   By: Luanne Bras M.D.   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 Bilateral testing is considered an integral part of a complete examination. Limited examinations for reoccurring indications may be performed as noted. +------------+----------+--------+------+----------+ Right DuplexPSV (cm/s)WaveformPlaqueComment(s) +------------+----------+--------+------+----------+ CFA            110                             +------------+----------+--------+------+----------+ Prox SFA        99                             +------------+----------+--------+------+----------+ Right Vein comments:Patent right common femoral vein.  Summary: No evidence of right groin pseudoaneurysm or AVF.    --------------------------------------------------------------------------------    Preliminary    ECHOCARDIOGRAM COMPLETE  Result Date: 04/11/2022    ECHOCARDIOGRAM  REPORT   Patient Name:   Patricia [name] Date of Exam: 04/10/2022 Medical Rec #:  [id number]       Height:       65.0 in Accession #:    [id number]      Weight:       176.8 lb Date of Birth:  1965-07-31       BSA:          1.877 m Patient Age:    14 years        BP:           122/82 mmHg Patient Gender: F               HR:           80 bpm. Exam Location:  Inpatient Procedure: 2D Echo, Color Doppler and Cardiac Doppler Indications:    stroke  History:        Patient has no prior history of Echocardiogram examinations.  Risk Factors:Hypertension and Current Smoker.  Sonographer:    Johny Chess RDCS Referring Phys: [id number] [place]  1. Left ventricular ejection fraction, by estimation, is 60 to 65%. The left ventricle has normal function. Left ventricular endocardial border not optimally defined to evaluate regional wall motion. Left ventricular diastolic parameters were normal.  2. Right ventricular systolic function is normal. The right ventricular size is normal. There is normal pulmonary artery systolic pressure. The estimated right ventricular systolic pressure is 123456 mmHg.  3. The mitral valve is normal in structure. Trivial mitral valve regurgitation. No evidence of mitral stenosis.  4. The aortic valve was not well visualized. Aortic valve regurgitation is not visualized. No aortic stenosis is present.  5. The inferior vena cava is normal in size with greater than 50% respiratory variability, suggesting right atrial pressure of 3 mmHg. FINDINGS  Left Ventricle: Left ventricular ejection fraction, by estimation, is 60 to 65%. The left ventricle has normal function. Left ventricular endocardial border not optimally defined to evaluate regional wall motion. The left ventricular internal cavity size was normal in size. There is no left ventricular hypertrophy. Left ventricular diastolic parameters were normal. Right Ventricle: The right ventricular size is normal. No  increase in right ventricular wall thickness. Right ventricular systolic function is normal. There is normal pulmonary artery systolic pressure. The tricuspid regurgitant velocity is 2.02 m/s, and  with an assumed right atrial pressure of 3 mmHg, the estimated right ventricular systolic pressure is 123456 mmHg. Left Atrium: Left atrial size was normal in size. Right Atrium: Right atrial size was normal in size. Pericardium: Trivial pericardial effusion is present. Mitral Valve: The mitral valve is normal in structure. Trivial mitral valve regurgitation. No evidence of mitral valve stenosis. Tricuspid Valve: The tricuspid valve is normal in structure. Tricuspid valve regurgitation is trivial. No evidence of tricuspid stenosis. Aortic Valve: The aortic valve was not well visualized. Aortic valve regurgitation is not visualized. No aortic stenosis is present. Pulmonic Valve: The pulmonic valve was normal in structure. Pulmonic valve regurgitation is trivial. No evidence of pulmonic stenosis. Aorta: The aortic root is normal in size and structure. Venous: The inferior vena cava is normal in size with greater than 50% respiratory variability, suggesting right atrial pressure of 3 mmHg. IAS/Shunts: No atrial level shunt detected by color flow Doppler.  LEFT VENTRICLE PLAX 2D LVIDd:         4.00 cm   Diastology LVIDs:         2.40 cm   LV e' medial:  8.92 cm/s LV PW:         0.80 cm   LV e' lateral: 7.51 cm/s LV IVS:        0.70 cm LVOT diam:     2.00 cm LV SV:         60 LV SV Index:   32 LVOT Area:     3.14 cm  RIGHT VENTRICLE             IVC RV S prime:     14.00 cm/s  IVC diam: 1.40 cm TAPSE (M-mode): 2.5 cm LEFT ATRIUM           Index        RIGHT ATRIUM           Index LA diam:      2.60 cm 1.39 cm/m   RA Area:     10.40 cm LA Vol (A2C): 26.6 ml 14.17 ml/m  RA Volume:  20.70 ml  11.03 ml/m  AORTIC VALVE LVOT Vmax:   86.60 cm/s LVOT Vmean:  58.200 cm/s LVOT VTI:    0.191 m  AORTA Ao Root diam: 3.10 cm TRICUSPID  VALVE TR Peak grad:   16.3 mmHg TR Vmax:        202.00 cm/s  SHUNTS Systemic VTI:  0.19 m Systemic Diam: 2.00 cm Cherlynn Kaiser MD Electronically signed by Cherlynn Kaiser MD Signature Date/Time: 04/11/2022/10:49:24 AM    Final    MR ANGIO HEAD WO CONTRAST  Result Date: 04/10/2022 CLINICAL DATA:  Follow-up examination for acute stroke, status post TNK and thrombectomy. EXAM: MRI HEAD WITHOUT CONTRAST MRA HEAD WITHOUT CONTRAST TECHNIQUE: Multiplanar, multi-echo pulse sequences of the brain and surrounding structures were acquired without intravenous contrast. Angiographic images of the Circle of Willis were acquired using MRA technique without intravenous contrast. COMPARISON:  Comparison made with prior CTs from 04/09/2022. FINDINGS: MRI HEAD FINDINGS Brain: Cerebral volume within normal limits. No significant cerebral white matter disease for age. Restricted diffusion involving the right caudate and lentiform nuclei, consistent with acute right MCA distribution infarct (series 2, image 32). No associated hemorrhage or significant regional mass effect. No other evidence for acute or subacute ischemia elsewhere within the brain. Gray-white matter differentiation otherwise maintained. No acute intracranial hemorrhage. Single punctate chronic microhemorrhage noted at the right occipital region, of doubtful significance in isolation. Incidental small lipoma noted at the left quadrigeminal plate cistern. No other mass lesion, midline shift, or mass effect. Ventricles normal size without hydrocephalus. No extra-axial fluid collection. Possible but are an arachnoid cyst measuring 8 mm noted at the sellar/suprasellar region (series 5, image 13). This is of doubtful significance. Pituitary gland and suprasellar region otherwise unremarkable. Vascular: Major intracranial vascular flow voids are maintained. Skull and upper cervical spine: Craniocervical junction within normal limits. Bone marrow signal intensity normal. No  scalp soft tissue abnormality. Sinuses/Orbits: Probable small dacryocystocele at the medial left orbit measures 9 mm (series 6, image 9). No associated inflammatory changes. Globes and orbital soft tissues otherwise unremarkable. Few small maxillary sinus retention cyst noted. Paranasal sinuses are otherwise largely clear. No mastoid effusion. Other: Endotracheal and enteric tubes in place. MRA HEAD FINDINGS Anterior circulation: Both internal carotid arteries are widely patent to the termini without stenosis or other abnormality. A1 segments patent bilaterally. Right A1 hypoplastic. Normal anterior communicating artery complex. Both ACAs remain patent to their distal aspects. There has been interval revascularization of previously seen right M1 occlusion. Right MCA and its distal branches are now widely patent and well perfused. Left M1 segment and its distal branches remain widely patent and well perfused as well. Posterior circulation: Vertebral arteries are diminutive but patent without stenosis. Both PICA patent. Basilar markedly diminutive but patent to its distal aspect without appreciable stenosis. Right AICA of supplied via the right vertebral artery. Superior cerebellar arteries patent bilaterally. Fetal type origin of the PCAs, both of which remain widely patent to their distal aspects. Anatomic variants: As above.  No aneurysm. IMPRESSION: MRI HEAD IMPRESSION: 1. 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 No associated hemorrhage or significant regional mass effect. 2. Otherwise normal brain MRI for age. MRA HEAD IMPRESSION: 1. Interval revascularization of previously seen right M1 occlusion. Right MCA and its distal branches are now widely patent and well perfused. 2. Otherwise stable and normal intracranial MRA. Electronically Signed   By: Jeannine Boga M.D.   On: 04/10/2022 23:09   MR BRAIN WO CONTRAST  Result Date: 04/10/2022 CLINICAL DATA:   Follow-up examination for acute stroke, status post TNK and thrombectomy. EXAM: MRI HEAD WITHOUT CONTRAST MRA HEAD WITHOUT CONTRAST TECHNIQUE: Multiplanar, multi-echo pulse sequences of the brain and surrounding structures were acquired without intravenous contrast. Angiographic images of the Circle of Willis were acquired using MRA technique without intravenous contrast. COMPARISON:  Comparison made with prior CTs from 04/09/2022. FINDINGS: MRI HEAD FINDINGS Brain: Cerebral volume within normal limits. No significant cerebral white matter disease for age. Restricted diffusion involving the right caudate and lentiform nuclei, consistent with acute right MCA distribution infarct (series 2, image 32). No associated hemorrhage or significant regional mass effect. No other evidence for acute or subacute ischemia elsewhere within the brain. Gray-white matter differentiation otherwise maintained. No acute intracranial hemorrhage. Single punctate chronic microhemorrhage noted at the right occipital region, of doubtful significance in isolation. Incidental small lipoma noted at the left quadrigeminal plate cistern. No other mass lesion, midline shift, or mass effect. Ventricles normal size without hydrocephalus. No extra-axial fluid collection. Possible but are an arachnoid cyst measuring 8 mm noted at the sellar/suprasellar region (series 5, image 13). This is of doubtful significance. Pituitary gland and suprasellar region otherwise unremarkable. Vascular: Major intracranial vascular flow voids are maintained. Skull and upper cervical spine: Craniocervical junction within normal limits. Bone marrow signal intensity normal. No scalp soft tissue abnormality. Sinuses/Orbits: Probable small dacryocystocele at the medial left orbit measures 9 mm (series 6, image 9). No associated inflammatory changes. Globes and orbital soft tissues otherwise unremarkable. Few small maxillary sinus retention cyst noted. Paranasal sinuses are  otherwise largely clear. No mastoid effusion. Other: Endotracheal and enteric tubes in place. MRA HEAD FINDINGS Anterior circulation: Both internal carotid arteries are widely patent to the termini without stenosis or other abnormality. A1 segments patent bilaterally. Right A1 hypoplastic. Normal anterior communicating artery complex. Both ACAs remain patent to their distal aspects. There has been interval revascularization of previously seen right M1 occlusion. Right MCA and its distal branches are now widely patent and well perfused. Left M1 segment and its distal branches remain widely patent and well perfused as well. Posterior circulation: Vertebral arteries are diminutive but patent without stenosis. Both PICA patent. Basilar markedly diminutive but patent to its distal aspect without appreciable stenosis. Right AICA of supplied via the right vertebral artery. Superior cerebellar arteries patent bilaterally. Fetal type origin of the PCAs, both of which remain widely patent to their distal aspects. Anatomic variants: As above.  No aneurysm. IMPRESSION: MRI HEAD IMPRESSION: 1. 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 No associated hemorrhage or significant regional mass effect. 2. Otherwise normal brain MRI for age. MRA HEAD IMPRESSION: 1. Interval revascularization of previously seen right M1 occlusion. Right MCA and its distal branches are now widely patent and well perfused. 2. Otherwise stable and normal intracranial MRA. Electronically Signed   By: Jeannine Boga M.D.   On: 04/10/2022 23:09   VAS [place] GROIN PSEUDOANEURYSM  Result Date: 04/10/2022  ARTERIAL PSEUDOANEURYSM  Patient Name:  TYNEESHA MORRISON  Date of Exam:   04/10/2022 Medical Rec #: [id number]        Accession #:    [id number] Date of Birth: [birth year]        Patient Gender: F Patient Age:   76 years Exam Location[facility] Procedure:      VAS [place] GROIN PSEUDOANEURYSM Referring Phys:  Luanne Bras --------------------------------------------------------------------------------  Exam: Right groin Indications: Patient complains of oozing from  stick site. 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 Comparison Study: No prior study Performing Technologist: Sharion Dove RVS  Examination Guidelines: A complete evaluation includes B-mode imaging, spectral Doppler, color Doppler, and power Doppler as needed of all accessible portions of each vessel. Bilateral testing is considered an integral part of a complete examination. Limited examinations for reoccurring indications may be performed as noted.  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 The neck measures approximately 0.4 cm wide and 0.4 cm long.  Summary: Partially thrombosed pseudoaneurysm arising off the proximal SFA noted in the right proximal thigh/groin area. Diagnosing physician: Harold Barban MD Electronically signed by Harold Barban MD on 04/10/2022 at 8:13:26 PM.   --------------------------------------------------------------------------------    Final    DG CHEST PORT 1 VIEW  Result Date: 04/10/2022 CLINICAL DATA:  Endotracheal tube placement EXAM: PORTABLE CHEST 1 VIEW COMPARISON:  Chest x-ray October 30, 2019 FINDINGS: The endotracheal tube terminates approximately 3.5 cm above the carina. The NG tube side port terminates below the diaphragm in the distal tip is below the field of view. The cardiomediastinal silhouette is unchanged in contour. No focal pulmonary opacity. No pleural effusion or pneumothorax. The visualized upper abdomen is unremarkable. No acute osseous abnormality. IMPRESSION: 1. Adequate positioning of the  endotracheal tube and NG tube. 2. No acute cardiopulmonary abnormality. Electronically Signed   By: Beryle Flock M.D.   On: 04/10/2022 14:04   DG Abd Portable 1V  Result Date: 04/10/2022 CLINICAL DATA:  OG tube placement. EXAM: PORTABLE ABDOMEN - 1 VIEW COMPARISON:  None Available. FINDINGS: OG tube tip is in the mid stomach. Proximal side port is at or just below the level of the GE junction. Tube could be advanced another 3-4 cm to ensure side port is below the GE junction, as clinically warranted visualized abdomen demonstrates nonspecific gas pattern. Contrast excretion noted from both kidneys compatible with contrast infused studies from 04/09/2022. IMPRESSION: OG tube tip is in the mid stomach. Proximal side port is at or just below the level of the GE junction. See above. Electronically Signed   By: Misty Stanley M.D.   On: 04/10/2022 07:34   CT HEAD WO CONTRAST (5MM)  Result Date: 04/10/2022 CLINICAL DATA:  Stroke follow-up. EXAM: CT HEAD WITHOUT CONTRAST TECHNIQUE: Contiguous axial images were obtained from the base of the skull through the vertex without intravenous contrast. RADIATION DOSE REDUCTION: This exam was performed according to the departmental dose-optimization program which includes automated exposure control, adjustment of the mA and/or kV according to patient size and/or use of iterative reconstruction technique. COMPARISON:  Head CT from yesterday FINDINGS: Brain: No evidence of acute infarction, hemorrhage, hydrocephalus, extra-axial collection or mass lesion/mass effect. Vascular: Prominent density of right M1 segment but only at the level of streak artifact on coronal reformats. Skull: Normal. Negative for fracture or focal lesion. Sinuses/Orbits: Negative IMPRESSION: Negative for hemorrhage or visible infarct. Electronically Signed   By: Jorje Guild M.D.   On: 04/10/2022 04:02   CT ANGIO HEAD NECK W WO CM (CODE STROKE)  Result Date: 04/09/2022 CLINICAL DATA:  Initial  evaluation for neuro deficit, stroke. EXAM: CT ANGIOGRAPHY HEAD AND NECK TECHNIQUE: Multidetector CT imaging of the head and neck was performed using the standard protocol  during bolus administration of intravenous contrast. Multiplanar CT image reconstructions and MIPs were obtained to evaluate the vascular anatomy. Carotid stenosis measurements (when applicable) are obtained utilizing NASCET criteria, using the distal internal carotid diameter as the denominator. RADIATION DOSE REDUCTION: This exam was performed according to the departmental dose-optimization program which includes automated exposure control, adjustment of the mA and/or kV according to patient size and/or use of iterative reconstruction technique. CONTRAST:  10m OMNIPAQUE IOHEXOL 350 MG/ML SOLN COMPARISON:  Prior CT from earlier the same day. FINDINGS: CTA NECK FINDINGS Aortic arch: Visualized aortic arch normal caliber with standard branch pattern. No stenosis about the origin of the great vessels. Right carotid system: Right common and internal carotid arteries are patent without dissection. Transverse filling defect involving the proximal cervical right ICA just distal to the bifurcation most characteristic of a carotid web (series 9, image 52). Associated short-segment stenosis of up to 55% by NASCET criteria. Left carotid system: Left common and internal carotid arteries are patent without stenosis or dissection. Vertebral arteries: Both vertebral arteries arise from the subclavian arteries. No proximal subclavian artery stenosis. Vertebral arteries are diminutive but patent without stenosis or dissection. Skeleton: No discrete or worrisome osseous lesions. Mild-to-moderate spondylosis noted at C5-6 and C6-7. Other neck: No other acute soft tissue abnormality within the neck. 1.8 cm heterogeneous left thyroid noted This has been evaluated on previous imaging, most recently with prior ultrasound from 07/26/2017. (Ref: J Am Coll Radiol. 2015  Feb;12(2): 143-50). Upper chest:  Visualized upper chest demonstrates no acute finding. Review of the MIP images confirms the above findings CTA HEAD FINDINGS Anterior circulation: Both internal carotid arteries are patent to the termini without stenosis. A1 segments patent bilaterally. Normal anterior communicating artery complex. Anterior cerebral arteries patent bilaterally. Left M1 segment widely patent. No proximal left MCA branch occlusion or high-grade stenosis. Distal left MCA branches well perfused. On the right, there is acute occlusion of the mid and distal right M1 segment, corresponding with hyperdense vessel on prior noncontrast CT (series 7, image 98). Attenuated flow within the right MCA territory distally, likely collateral nature. Posterior circulation: V4 segments diminutive but widely patent without stenosis. Both PICA patent. Basilar fenestrated and markedly diminutive but patent without stenosis. Superior cerebral arteries patent bilaterally. Fetal type origin of the PCAs, both of which are widely patent to their distal aspects. Venous sinuses: Patent allowing for timing the contrast bolus. Anatomic variants: Fetal type origin of the PCAs with overall diminutive vertebrobasilar system. No aneurysm. Review of the MIP images confirms the above findings IMPRESSION: 1. Positive CTA for emergent large vessel occlusion, with acute occlusion of the mid and distal right M1 segment. 2. Transverse filling defect involving the proximal cervical right ICA just distal to the bifurcation, most characteristic of a carotid web. Associated short-segment stenosis of up to 55% by NASCET criteria. 3. Fetal type origin of the PCAs with overall diminutive vertebrobasilar system. These results were communicated to Dr. BCurly Shoresat 10:42 pm on 04/09/2022 by text page via the [facility]aging system. Electronically Signed   By: BJeannine BogaM.D.   On: 04/09/2022 22:55   CT HEAD CODE STROKE WO CONTRAST  Result  Date: 04/09/2022 CLINICAL DATA:  Code stroke. Initial evaluation for neuro deficit, stroke. EXAM: CT HEAD WITHOUT CONTRAST TECHNIQUE: Contiguous axial images were obtained from the base of the skull through the vertex without intravenous contrast. RADIATION DOSE REDUCTION: This exam was performed according to the departmental dose-optimization program which includes automated exposure control, adjustment  of the mA and/or kV according to patient size and/or use of iterative reconstruction technique. COMPARISON:  None Available. FINDINGS: Brain: Cerebral volume within normal limits. No acute intracranial hemorrhage. There is subtle loss of gray-white matter differentiation involving the right insula as well as the right caudate and lentiform nuclei, concerning for early acute right MCA territory infarct. Gray-white matter differentiation otherwise grossly maintained. No mass lesion or midline shift. No hydrocephalus or extra-axial fluid collection. Tiny lipoma noted near the quadrigeminal plate cistern. Vascular: Dense right M1 segment, concerning for thrombus in large vessel occlusion. Skull: Scalp soft tissues and calvarium demonstrate no acute finding. Sinuses/Orbits: Right gaze preference noted. Paranasal sinuses are largely clear. No mastoid effusion. Other: None. ASPECTS [facility] Stroke Program Early CT Score) - Ganglionic level infarction (caudate, lentiform nuclei, internal capsule, insula, M1-M3 cortex): 4 - Supraganglionic infarction (M4-M6 cortex): 3 Total score (0-10 with 10 being normal): 7 IMPRESSION: 1. Dense right M1 segment, concerning for thrombus/large vessel occlusion. 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 No intracranial hemorrhage. 2. ASPECTS is 7. These results were communicated to Dr. Curly Shores at 10:11 pm on 04/09/2022 by text page via the [facility] messaging system. Electronically Signed   By: Jeannine Boga M.D.   On: 04/09/2022 22:16      HISTORY OF PRESENT  ILLNESS Patient with a history of smoking, hypertension and asthma presented with left-sided weakness and numbness.  HOSPITAL COURSE Patient received TNK and underwent mechanical thrombectomy to right M1 with Tiki 3 revascularization achieved.  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  Patient was found to have bruising and a pseudoaneurysm in her right groin, but the pseudoaneurysm resolved on its own.    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 most characteristic of a carotid web. Associated short-segment stenosis of up to 55% by NASCET criteria. Fetal type origin of the PCAs with overall diminutive vertebrobasilar system IR s/p right MCA TICI 3 revascularization and right ICA stent. Post IR CT No ICH  MRI / MRA acute right MCA infarct involving right caudate and lentiform nuclei.  No associated hemorrhage or mass effect.  MR angiogram shows interval revascularization of the previously occluded right M1. 2D Echo ejection fraction 60 to 65%. LDL 81 HgbA1c 5.1 UDS +THC VTE prophylaxis - SCD's  No antithrombotic prior to admission, now on ASA and brilinta given right ICA stenting.   Therapy recommendations: Home health PT/OT   Right groin pseudoaneurysm [place] right groin - small pseudoaneurysm  S/p manual compression  Bed flat  Repeat US right groin 04/11/2022 shows no pseudoaneurysm   Acute Respiratory Failure due to inability to protect airway  Vent management per CCM  Goal SPO2 > 92% Keep intubated today for right groin pseudoaneurysm, ? Aspiration with dysphagia Extubated 2/12   Hypertension Home meds:  none  On the low end On neo gtt to maintain BP goal  BP goal  120-140 within 24h post IR Long-term BP goal normotensive   Hyperlipidemia Home meds:  none LDL 81, goal < 70 Add Crestor 20 mg   Continue statin at discharge   Dysphagia  OG tube  On ASA and brilinta via OG Consider  starting TF in the next 24 hrs    Tobacco abuse Current smoker Smoking cessation counseling will be provided   Other Stroke Risk Factors THC abuse, UDS positive for THC, cessation education will be provided.  RN Pressure Injury Documentation:     DISCHARGE EXAM Blood pressure 122/82, pulse 89, temperature 98.5 F (36.9 C), temperature source Oral, resp. rate (!) 25, height 5' 5"$  (1.651 m), weight 85 kg, SpO2 99 %. General: Alert, well-nourished, well-developed patient in no acute distress Respiratory: Regular, unlabored respirations on room air  NEURO:  Mental Status: AA&Ox3  Speech/Language: speech is without dysarthria or aphasia.    Cranial Nerves:  II: PERRL. Visual fields full.  III, IV, VI: EOMI. Eyelids elevate symmetrically.  V: Sensation is intact to light touch and symmetrical to face.  VII: Smile is symmetrical. Able to puff cheeks and raise eyebrows.  VIII: hearing intact to voice. IX, X: Palate elevates symmetrically. Phonation is normal.  [id number] shrug 5/5. XII: tongue is midline without fasciculations. Motor: 5/5 strength to all muscle groups tested.  Diminished fine finger movements on the left and orbits right over left upper extremity. Tone: is normal and bulk is normal Sensation- Intact to light touch bilaterally.  Coordination: FTN intact bilaterally.No drift.  Slightly diminished fine finger movements on left with right hand orbiting left Gait- deferred    Discharge Diet       Diet   Diet Heart Room service appropriate? Yes with Assist; Fluid consistency: Thin   liquids  DISCHARGE PLAN Disposition: Home aspirin 81 mg daily and Brilinta (ticagrelor) 90 mg bid for secondary stroke prevention.  If patient is unable to  obtain Brilinta due to insurance coverage, will need to switch to Plavix.  Discussed this with patient, and patient in agreement to obtain a prescription for Plavix in the event she cannot obtain Brilinta. Ongoing stroke risk factor control by Primary Care Physician at time of discharge Follow-up PCP Center, [facility] in 2 weeks. Follow-up in [facility] in 8 weeks with Dr. Leonie Man, office to schedule an appointment.   45 minutes were spent preparing discharge.  [place] , MSN, AGACNP-BC [facility] See Amion for schedule and pager information 04/12/2022 11:58 AM   I have personally obtained history,examined this patient, reviewed notes, independently viewed imaging studies, participated in medical decision making and plan of care.ROS completed by me personally and pertinent positives fully documented  I have made any additions or clarifications directly to the above note. Agree with note above.    Antony Contras, MD Medical Director [facility] Pager: [phone] 04/12/2022 12:14 PM

## 2022-04-12 NOTE — Discharge Instructions (Addendum)
Patricia [name], you were admitted to the hospital with left sided weakness and numbness.  You were found to be having a stroke, and you were treated with TNK and a mechanical thrombectomy.  A stent was placed in your right carotid artery due to a carotid web, and because of the stent, you will need to take aspirin and Brilinta for 6 months.  If you are unable to obtain Brilinta due to insurance issues, please obtain a prescription for Plavix.  You developed some bruising and a pseudoaneurysm in your groin after the mechanical thrombectomy, but the pseudoaneurysm resolved on its own, and the bruising is expected to resolve naturally.  You will need to see your PCP in one week and be seen in the stroke clinic in 6-8 weeks.

## 2022-04-12 NOTE — Progress Notes (Signed)
Referring Physician(s): Code Stroke  Supervising Physician: Luanne Bras  Patient Status:  [facility] - In-pt  Chief Complaint:  Right MCA syndrome. 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 Estanislado Pandy on 2.11.24.   Subjective:  Patient seen at bedside with RN present. Patient getting ready to ambulate with RN. Denies any neurological deficits. Endorses a sore throat  Allergies: Tramadol  Medications: Prior to Admission medications   Medication Sig Start Date End Date Taking? Authorizing Provider  albuterol (PROVENTIL HFA;VENTOLIN HFA) 108 (90 Base) MCG/ACT inhaler Inhale 2 puffs into the lungs every 6 (six) hours as needed for wheezing or shortness of breath. 07/08/16   Delman Kitten, MD  amLODipine (NORVASC) 5 MG tablet Take 5 mg by mouth daily. 02/15/22   [provider]  cephALEXin (KEFLEX) 500 MG capsule Take 1 capsule (500 mg total) by mouth 4 (four) times daily. 09/29/21   Johnn Hai, PA-C  citalopram (CELEXA) 20 MG tablet Take 20 mg by mouth at bedtime. 03/17/22   [provider]  hydrochlorothiazide (HYDRODIURIL) 25 MG tablet Take 25 mg by mouth daily. 03/17/22   [provider]  NIFEdipine (ADALAT CC) 30 MG 24 hr tablet Take 30 mg by mouth daily. 03/20/22   [provider]     Vital Signs: BP 132/86   Pulse 77   Temp 98.5 F (36.9 C) (Oral)   Resp 12   Ht 5' 5"$  (1.651 m)   Wt 187 lb 6.3 oz (85 kg)   SpO2 99%   BMI 31.18 kg/m   Physical Exam Vitals and nursing note reviewed.  Constitutional:      Appearance: She is well-developed.  HENT:     Head: Normocephalic and atraumatic.  Eyes:     Conjunctiva/sclera: Conjunctivae normal.  Cardiovascular:     Rate and Rhythm: Normal rate and regular rhythm.  Pulmonary:     Effort: Pulmonary effort is normal.  Musculoskeletal:        General: Normal  range of motion.     Cervical back: Normal range of motion.  Skin:    General: Skin is warm.  Neurological:     Mental Status: She is alert and oriented to person, place, and time.     Comments: Alert, aware and oriented X 3 Speech and comprehension is intact.  No facial droop noted Can spontaneously move all 4 extremities. Fine motor and coordination intact.  Speech, cognition and language intact.  Comprehension and fluency are normal.  Judgment and insight normal Negative pronator drift. Gait not assessed Romberg not assessed Heel to toe not assessed Distal pulses not assessed   Psychiatric:        Mood and Affect: Mood normal.        Behavior: Behavior normal.        Thought Content: Thought content normal.        Judgment: Judgment normal.     Imaging: IR PERCUTANEOUS ART THROMBECTOMY/INFUSION INTRACRANIAL INC DIAG ANGIO  Result Date: 04/12/2022 INDICATION: Acute onset of left-sided weakness, neglect and right-sided gaze deviation. CT angiogram reveals occluded right middle cerebral artery with a prominent smooth filling defect with a 50-55% stenosis. EXAM: 1. EMERGENT LARGE VESSEL OCCLUSION THROMBOLYSIS (anterior CIRCULATION) COMPARISON:  CT angiogram of the head and neck of April 09, 2022. MEDICATIONS: Ancef 2 g IV antibiotic was administered within 1 hour of the procedure. ANESTHESIA/SEDATION: General anesthesia. CONTRAST:  Omnipaque 300 90 cc. FLUOROSCOPY TIME:  Fluoroscopy Time: 19 minutes 0 seconds (1054 mGy). COMPLICATIONS: None immediate. TECHNIQUE: Following a full explanation of the procedure along with the potential associated complications, an informed witnessed consent was obtained. The risks of intracranial hemorrhage of 10%, worsening neurological deficit, ventilator dependency, death and inability to revascularize were all reviewed in detail with the patient's mother. The patient was then put under general anesthesia by the Department of Anesthesiology at [facility] The right groin was prepped and draped in the usual sterile fashion. Thereafter using modified Seldinger technique, transfemoral access into the right common femoral artery was obtained without difficulty. Over an 0.035 inch guidewire a combination of a 5.5 French 125 cm support catheter inside of an 087 90 cm balloon guide catheter was advanced to the aortic arch region, and selectively positioned just proximal to the right internal carotid artery. The guidewire, and the support catheter were removed. Good aspiration obtained from the hub of the balloon guide catheter. A gentle control arteriogram performed centered extra cranially and intracranially. FINDINGS: The right external carotid artery and its major branches demonstrate wide patency. The right internal carotid artery at the bulb demonstrates a prominent smooth lobulated filling defect with narrowing of approximately 70% by the NASCET criteria. More distally, the right internal carotid artery is seen to ascend normally to the cranial skull base. The petrous, the cavernous and the supraclinoid right ICA demonstrate wide patency. A prominent posterior communicating artery is seen opacifying the right posterior cerebral artery distribution. 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 The right anterior cerebral artery opacifies into the capillary and venous phases. PROCEDURE: Through the balloon guide catheter at the bulb, an 070 aspiration catheter with its Tenzing support catheter were navigated under constant fluoroscopic guidance without any difficulty to the supraclinoid right ICA. The combination was then advanced through the occluded right middle cerebral artery with the 070 aspiration imbedded in the occluded right M1 segment. The Tenzing cm support catheter was removed. Aspiration was then applied at the hub of the 070 cm aspiration catheter for a minute and a half with proximal flow arrest. The aspiration  catheter was removed. Two large clots were identified and the canister. Following reversal of flow arrest, a control arteriogram performed through the balloon guide catheter demonstrated complete revascularization of right MCA distribution achieving a TICI 3 revascularization. The right anterior cerebral artery and the posterior communicating artery remained widely patent. An exchange 300 cm 014 inch Zoom soft tip micro guidewire was then advanced through the balloon guide catheter in the mid M1 segment with widening advanced to the horizontal petrous segment. The balloon guide was then gently retrieved to just proximal to the bifurcation. A control arteriogram performed through this the prominent smooth shelf-like filling defect extending circumferentially and also into the lumen with a significant stenosis. Measurements were performed of the internal carotid artery distal to the bulb and proximally the distal right common carotid artery. A 6 mm x 8 mm/4 cm Xact stent delivery system was then prepped and purged retrogradely with heparinized saline infusion. Using the rapid exchange technique, this was advanced without difficulty over the exchange micro guidewire and positioned such that the distal and proximal markers were at the desired locations. Stent was then deployed in the usual manner without difficulty. The delivery apparatus was retrieved and removed. A control arteriogram performed through the balloon guide catheter in the right common carotid artery demonstrated excellent apposition with significantly improved  caliber at the bulb. More distally, the right MCA distribution continued to demonstrated a TICI 3 revascularization. Arteriograms were then performed at 10 and 20 minutes post deployment of the stent. These continued to demonstrate excellent apposition of the stent proximally, with patency of the right MCA distribution. A final control arteriogram was then performed through the balloon guide  catheter after removal of the exchange micro guidewire. Patency of the stent and apposition was maintained. No evidence of intra stent irregularities evident angiographically. Distally the right MCA and the right anterior cerebral artery and the right posterior communicating artery demonstrated wide patency. The balloon guide catheter was removed. An 8 French Angio-Seal closure device was then deployed at the right groin puncture site with hemostasis. Distal pulses remained palpable in both feet unchanged. A flat panel CT of the brain demonstrated no evidence of hemorrhagic complications. Additionally, manual compression with quick clot was applied for 30 minutes in view of slight contrast extravasation noted at the right common femoral artery puncture site. Patient was left intubated and transferred to the ICU for post revascularization care. Patient was loaded with aspirin 81 mg and Brilinta 180 mg prior to the placement of the stent. Additionally, the patient was given a bolus dose of IV cangrelor followed by a 4 hour infusion, which was stopped after infusion of quarter of the a dose due to the right common femoral slight contrast extravasation. IMPRESSION: Status post complete revascularization of occluded right M1 segment with 1 pass with an 070 FreeClimb aspiration catheter flow arrest achieving a TICI 3 revascularization. 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 PLAN: Follow-up in clinic 2 weeks post discharge. Electronically Signed   By: Luanne Bras M.D.   On: 04/12/2022 08:27   IR CT Head Ltd  Result Date: 04/12/2022 INDICATION: Acute onset of left-sided weakness, neglect and right-sided gaze deviation. CT angiogram reveals occluded right middle cerebral artery with a prominent smooth filling defect with a 50-55% stenosis. EXAM: 1. EMERGENT LARGE VESSEL OCCLUSION THROMBOLYSIS (anterior CIRCULATION)  COMPARISON:  CT angiogram of the head and neck of April 09, 2022. MEDICATIONS: Ancef 2 g IV antibiotic was administered within 1 hour of the procedure. ANESTHESIA/SEDATION: General anesthesia. CONTRAST:  Omnipaque 300 90 cc. FLUOROSCOPY TIME:  Fluoroscopy Time: 19 minutes 0 seconds (1054 mGy). COMPLICATIONS: None immediate. TECHNIQUE: Following a full explanation of the procedure along with the potential associated complications, an informed witnessed consent was obtained. The risks of intracranial hemorrhage of 10%, worsening neurological deficit, ventilator dependency, death and inability to revascularize were all reviewed in detail with the patient's mother. The patient was then put under general anesthesia by the Department of Anesthesiology at [facility]. The right groin was prepped and draped in the usual sterile fashion. Thereafter using modified Seldinger technique, transfemoral access into the right common femoral artery was obtained without difficulty. Over an 0.035 inch guidewire a combination of a 5.5 French 125 cm support catheter inside of an 087 90 cm balloon guide catheter was advanced to the aortic arch region, and selectively positioned just proximal to the right internal carotid artery. The guidewire, and the support catheter were removed. Good aspiration obtained from the hub of the balloon guide catheter. A gentle control arteriogram performed centered extra cranially and intracranially. FINDINGS: The right external carotid artery and its major branches demonstrate wide patency. The right internal carotid artery at the bulb demonstrates a prominent smooth lobulated filling  defect with narrowing of approximately 70% by the NASCET criteria. More distally, the right internal carotid artery is seen to ascend normally to the cranial skull base. The petrous, the cavernous and the supraclinoid right ICA demonstrate wide patency. A prominent posterior communicating artery is seen opacifying  the right posterior cerebral artery distribution. 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 The right anterior cerebral artery opacifies into the capillary and venous phases. PROCEDURE: Through the balloon guide catheter at the bulb, an 070 aspiration catheter with its Tenzing support catheter were navigated under constant fluoroscopic guidance without any difficulty to the supraclinoid right ICA. The combination was then advanced through the occluded right middle cerebral artery with the 070 aspiration imbedded in the occluded right M1 segment. The Tenzing cm support catheter was removed. Aspiration was then applied at the hub of the 070 cm aspiration catheter for a minute and a half with proximal flow arrest. The aspiration catheter was removed. Two large clots were identified and the canister. Following reversal of flow arrest, a control arteriogram performed through the balloon guide catheter demonstrated complete revascularization of right MCA distribution achieving a TICI 3 revascularization. The right anterior cerebral artery and the posterior communicating artery remained widely patent. An exchange 300 cm 014 inch Zoom soft tip micro guidewire was then advanced through the balloon guide catheter in the mid M1 segment with widening advanced to the horizontal petrous segment. The balloon guide was then gently retrieved to just proximal to the bifurcation. A control arteriogram performed through this the prominent smooth shelf-like filling defect extending circumferentially and also into the lumen with a significant stenosis. Measurements were performed of the internal carotid artery distal to the bulb and proximally the distal right common carotid artery. A 6 mm x 8 mm/4 cm Xact stent delivery system was then prepped and purged retrogradely with heparinized saline infusion. Using the rapid exchange technique, this was advanced without difficulty over the exchange micro  guidewire and positioned such that the distal and proximal markers were at the desired locations. Stent was then deployed in the usual manner without difficulty. The delivery apparatus was retrieved and removed. A control arteriogram performed through the balloon guide catheter in the right common carotid artery demonstrated excellent apposition with significantly improved caliber at the bulb. More distally, the right MCA distribution continued to demonstrated a TICI 3 revascularization. Arteriograms were then performed at 10 and 20 minutes post deployment of the stent. These continued to demonstrate excellent apposition of the stent proximally, with patency of the right MCA distribution. A final control arteriogram was then performed through the balloon guide catheter after removal of the exchange micro guidewire. Patency of the stent and apposition was maintained. No evidence of intra stent irregularities evident angiographically. Distally the right MCA and the right anterior cerebral artery and the right posterior communicating artery demonstrated wide patency. The balloon guide catheter was removed. An 8 French Angio-Seal closure device was then deployed at the right groin puncture site with hemostasis. Distal pulses remained palpable in both feet unchanged. A flat panel CT of the brain demonstrated no evidence of hemorrhagic complications. Additionally, manual compression with quick clot was applied for 30 minutes in view of slight contrast extravasation noted at the right common femoral artery puncture site. Patient was left intubated and transferred to the ICU for post revascularization care. Patient was loaded with aspirin 81 mg and Brilinta 180 mg prior to the placement of the stent. Additionally, the patient was given  a bolus dose of IV cangrelor followed by a 4 hour infusion, which was stopped after infusion of quarter of the a dose due to the right common femoral slight contrast extravasation.  IMPRESSION: Status post complete revascularization of occluded right M1 segment with 1 pass with an 070 FreeClimb aspiration catheter flow arrest achieving a TICI 3 revascularization. 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 PLAN: Follow-up in clinic 2 weeks post discharge. Electronically Signed   By: Luanne Bras M.D.   On: 04/12/2022 08:27   IR INTRAVSC STENT CERV CAROTID W/O EMB-PROT MOD SED  Result Date: 04/12/2022 INDICATION: Acute onset of left-sided weakness, neglect and right-sided gaze deviation. CT angiogram reveals occluded right middle cerebral artery with a prominent smooth filling defect with a 50-55% stenosis. EXAM: 1. EMERGENT LARGE VESSEL OCCLUSION THROMBOLYSIS (anterior CIRCULATION) COMPARISON:  CT angiogram of the head and neck of April 09, 2022. MEDICATIONS: Ancef 2 g IV antibiotic was administered within 1 hour of the procedure. ANESTHESIA/SEDATION: General anesthesia. CONTRAST:  Omnipaque 300 90 cc. FLUOROSCOPY TIME:  Fluoroscopy Time: 19 minutes 0 seconds (1054 mGy). COMPLICATIONS: None immediate. TECHNIQUE: Following a full explanation of the procedure along with the potential associated complications, an informed witnessed consent was obtained. The risks of intracranial hemorrhage of 10%, worsening neurological deficit, ventilator dependency, death and inability to revascularize were all reviewed in detail with the patient's mother. The patient was then put under general anesthesia by the Department of Anesthesiology at [facility] The right groin was prepped and draped in the usual sterile fashion. Thereafter using modified Seldinger technique, transfemoral access into the right common femoral artery was obtained without difficulty. Over an 0.035 inch guidewire a combination of a 5.5 French 125 cm support catheter inside of an 087 90 cm balloon guide catheter was advanced  to the aortic arch region, and selectively positioned just proximal to the right internal carotid artery. The guidewire, and the support catheter were removed. Good aspiration obtained from the hub of the balloon guide catheter. A gentle control arteriogram performed centered extra cranially and intracranially. FINDINGS: The right external carotid artery and its major branches demonstrate wide patency. The right internal carotid artery at the bulb demonstrates a prominent smooth lobulated filling defect with narrowing of approximately 70% by the NASCET criteria. More distally, the right internal carotid artery is seen to ascend normally to the cranial skull base. The petrous, the cavernous and the supraclinoid right ICA demonstrate wide patency. A prominent posterior communicating artery is seen opacifying the right posterior cerebral artery distribution. 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 The right anterior cerebral artery opacifies into the capillary and venous phases. PROCEDURE: Through the balloon guide catheter at the bulb, an 070 aspiration catheter with its Tenzing support catheter were navigated under constant fluoroscopic guidance without any difficulty to the supraclinoid right ICA. The combination was then advanced through the occluded right middle cerebral artery with the 070 aspiration imbedded in the occluded right M1 segment. The Tenzing cm support catheter was removed. Aspiration was then applied at the hub of the 070 cm aspiration catheter for a minute and a half with proximal flow arrest. The aspiration catheter was removed. Two large clots were identified and the canister. Following reversal of flow arrest, a control arteriogram performed through the balloon guide catheter demonstrated complete revascularization of right MCA distribution achieving a TICI 3 revascularization. The right  anterior cerebral artery and the posterior communicating artery remained  widely patent. An exchange 300 cm 014 inch Zoom soft tip micro guidewire was then advanced through the balloon guide catheter in the mid M1 segment with widening advanced to the horizontal petrous segment. The balloon guide was then gently retrieved to just proximal to the bifurcation. A control arteriogram performed through this the prominent smooth shelf-like filling defect extending circumferentially and also into the lumen with a significant stenosis. Measurements were performed of the internal carotid artery distal to the bulb and proximally the distal right common carotid artery. A 6 mm x 8 mm/4 cm Xact stent delivery system was then prepped and purged retrogradely with heparinized saline infusion. Using the rapid exchange technique, this was advanced without difficulty over the exchange micro guidewire and positioned such that the distal and proximal markers were at the desired locations. Stent was then deployed in the usual manner without difficulty. The delivery apparatus was retrieved and removed. A control arteriogram performed through the balloon guide catheter in the right common carotid artery demonstrated excellent apposition with significantly improved caliber at the bulb. More distally, the right MCA distribution continued to demonstrated a TICI 3 revascularization. Arteriograms were then performed at 10 and 20 minutes post deployment of the stent. These continued to demonstrate excellent apposition of the stent proximally, with patency of the right MCA distribution. A final control arteriogram was then performed through the balloon guide catheter after removal of the exchange micro guidewire. Patency of the stent and apposition was maintained. No evidence of intra stent irregularities evident angiographically. Distally the right MCA and the right anterior cerebral artery and the right posterior communicating artery demonstrated wide patency. The balloon guide catheter was removed. An 8 French  Angio-Seal closure device was then deployed at the right groin puncture site with hemostasis. Distal pulses remained palpable in both feet unchanged. A flat panel CT of the brain demonstrated no evidence of hemorrhagic complications. Additionally, manual compression with quick clot was applied for 30 minutes in view of slight contrast extravasation noted at the right common femoral artery puncture site. Patient was left intubated and transferred to the ICU for post revascularization care. Patient was loaded with aspirin 81 mg and Brilinta 180 mg prior to the placement of the stent. Additionally, the patient was given a bolus dose of IV cangrelor followed by a 4 hour infusion, which was stopped after infusion of quarter of the a dose due to the right common femoral slight contrast extravasation. IMPRESSION: Status post complete revascularization of occluded right M1 segment with 1 pass with an 070 FreeClimb aspiration catheter flow arrest achieving a TICI 3 revascularization. 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 PLAN: Follow-up in clinic 2 weeks post discharge. Electronically Signed   By: Luanne Bras M.D.   On: 04/12/2022 08:27   IR CT Head Ltd  Result Date: 04/12/2022 INDICATION: Acute onset of left-sided weakness, neglect and right-sided gaze deviation. CT angiogram reveals occluded right middle cerebral artery with a prominent smooth filling defect with a 50-55% stenosis. EXAM: 1. EMERGENT LARGE VESSEL OCCLUSION THROMBOLYSIS (anterior CIRCULATION) COMPARISON:  CT angiogram of the head and neck of April 09, 2022. MEDICATIONS: Ancef 2 g IV antibiotic was administered within 1 hour of the procedure. ANESTHESIA/SEDATION: General anesthesia. CONTRAST:  Omnipaque 300 90 cc. FLUOROSCOPY TIME:  Fluoroscopy Time: 19 minutes 0 seconds (1054 mGy). COMPLICATIONS: None immediate. TECHNIQUE: Following a  full  explanation of the procedure along with the potential associated complications, an informed witnessed consent was obtained. The risks of intracranial hemorrhage of 10%, worsening neurological deficit, ventilator dependency, death and inability to revascularize were all reviewed in detail with the patient's mother. The patient was then put under general anesthesia by the Department of Anesthesiology at [facility]. The right groin was prepped and draped in the usual sterile fashion. Thereafter using modified Seldinger technique, transfemoral access into the right common femoral artery was obtained without difficulty. Over an 0.035 inch guidewire a combination of a 5.5 French 125 cm support catheter inside of an 087 90 cm balloon guide catheter was advanced to the aortic arch region, and selectively positioned just proximal to the right internal carotid artery. The guidewire, and the support catheter were removed. Good aspiration obtained from the hub of the balloon guide catheter. A gentle control arteriogram performed centered extra cranially and intracranially. FINDINGS: The right external carotid artery and its major branches demonstrate wide patency. The right internal carotid artery at the bulb demonstrates a prominent smooth lobulated filling defect with narrowing of approximately 70% by the NASCET criteria. More distally, the right internal carotid artery is seen to ascend normally to the cranial skull base. The petrous, the cavernous and the supraclinoid right ICA demonstrate wide patency. A prominent posterior communicating artery is seen opacifying the right posterior cerebral artery distribution. 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 The right anterior cerebral artery opacifies into the capillary and venous phases. PROCEDURE: Through the balloon guide catheter at the bulb, an 070 aspiration catheter with its Tenzing support catheter were navigated under constant  fluoroscopic guidance without any difficulty to the supraclinoid right ICA. The combination was then advanced through the occluded right middle cerebral artery with the 070 aspiration imbedded in the occluded right M1 segment. The Tenzing cm support catheter was removed. Aspiration was then applied at the hub of the 070 cm aspiration catheter for a minute and a half with proximal flow arrest. The aspiration catheter was removed. Two large clots were identified and the canister. Following reversal of flow arrest, a control arteriogram performed through the balloon guide catheter demonstrated complete revascularization of right MCA distribution achieving a TICI 3 revascularization. The right anterior cerebral artery and the posterior communicating artery remained widely patent. An exchange 300 cm 014 inch Zoom soft tip micro guidewire was then advanced through the balloon guide catheter in the mid M1 segment with widening advanced to the horizontal petrous segment. The balloon guide was then gently retrieved to just proximal to the bifurcation. A control arteriogram performed through this the prominent smooth shelf-like filling defect extending circumferentially and also into the lumen with a significant stenosis. Measurements were performed of the internal carotid artery distal to the bulb and proximally the distal right common carotid artery. A 6 mm x 8 mm/4 cm Xact stent delivery system was then prepped and purged retrogradely with heparinized saline infusion. Using the rapid exchange technique, this was advanced without difficulty over the exchange micro guidewire and positioned such that the distal and proximal markers were at the desired locations. Stent was then deployed in the usual manner without difficulty. The delivery apparatus was retrieved and removed. A control arteriogram performed through the balloon guide catheter in the right common carotid artery demonstrated excellent apposition with significantly  improved caliber at the bulb. More distally, the right MCA distribution continued to demonstrated a TICI 3 revascularization. Arteriograms were then  performed at 10 and 20 minutes post deployment of the stent. These continued to demonstrate excellent apposition of the stent proximally, with patency of the right MCA distribution. A final control arteriogram was then performed through the balloon guide catheter after removal of the exchange micro guidewire. Patency of the stent and apposition was maintained. No evidence of intra stent irregularities evident angiographically. Distally the right MCA and the right anterior cerebral artery and the right posterior communicating artery demonstrated wide patency. The balloon guide catheter was removed. An 8 French Angio-Seal closure device was then deployed at the right groin puncture site with hemostasis. Distal pulses remained palpable in both feet unchanged. A flat panel CT of the brain demonstrated no evidence of hemorrhagic complications. Additionally, manual compression with quick clot was applied for 30 minutes in view of slight contrast extravasation noted at the right common femoral artery puncture site. Patient was left intubated and transferred to the ICU for post revascularization care. Patient was loaded with aspirin 81 mg and Brilinta 180 mg prior to the placement of the stent. Additionally, the patient was given a bolus dose of IV cangrelor followed by a 4 hour infusion, which was stopped after infusion of quarter of the a dose due to the right common femoral slight contrast extravasation. IMPRESSION: Status post complete revascularization of occluded right M1 segment with 1 pass with an 070 FreeClimb aspiration catheter flow arrest achieving a TICI 3 revascularization. 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 PLAN: Follow-up in clinic 2 weeks post discharge.  Electronically Signed   By: Luanne Bras M.D.   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 Bilateral testing is considered an integral part of a complete examination. Limited examinations for reoccurring indications may be performed as noted. +------------+----------+--------+------+----------+ Right DuplexPSV (cm/s)WaveformPlaqueComment(s) +------------+----------+--------+------+----------+ CFA            110                             +------------+----------+--------+------+----------+ Prox SFA        99                             +------------+----------+--------+------+----------+ Right Vein comments:Patent right common femoral vein.  Summary: No evidence of right groin pseudoaneurysm or AVF.    --------------------------------------------------------------------------------    Preliminary    ECHOCARDIOGRAM COMPLETE  Result Date: 04/11/2022    ECHOCARDIOGRAM REPORT   Patient Name:   KHYLEIGH MUGRAGE Date of Exam: 04/10/2022 Medical Rec #:  [id number]       Height:       65.0 in  Accession #:    [id number]      Weight:       176.8 lb Date of Birth:  August 25, 1965       BSA:          1.877 m Patient Age:    37 years        BP:           122/82 mmHg  Patient Gender: F               HR:           80 bpm. Exam Location:  Inpatient Procedure: 2D Echo, Color Doppler and Cardiac Doppler Indications:    stroke  History:        Patient has no prior history of Echocardiogram examinations.                 Risk Factors:Hypertension and Current Smoker.  Sonographer:    Johny Chess RDCS Referring Phys: [id number] [place]  1. Left ventricular ejection fraction, by estimation, is 60 to 65%. The left ventricle has normal function. Left ventricular endocardial border not optimally defined to evaluate regional wall motion. Left ventricular diastolic parameters were normal.  2. Right ventricular systolic function is normal. The right ventricular size is normal. There is normal pulmonary artery systolic pressure. The estimated right ventricular systolic pressure is 123456 mmHg.  3. The mitral valve is normal in structure. Trivial mitral valve regurgitation. No evidence of mitral stenosis.  4. The aortic valve was not well visualized. Aortic valve regurgitation is not visualized. No aortic stenosis is present.  5. The inferior vena cava is normal in size with greater than 50% respiratory variability, suggesting right atrial pressure of 3 mmHg. FINDINGS  Left Ventricle: Left ventricular ejection fraction, by estimation, is 60 to 65%. The left ventricle has normal function. Left ventricular endocardial border not optimally defined to evaluate regional wall motion. The left ventricular internal cavity size was normal in size. There is no left ventricular hypertrophy. Left ventricular diastolic parameters were normal. Right Ventricle: The right ventricular size is normal. No increase in right ventricular wall thickness. Right ventricular systolic function is normal. There is normal pulmonary artery systolic pressure. The tricuspid regurgitant velocity is 2.02 m/s, and  with an assumed right atrial pressure of 3 mmHg, the estimated right ventricular systolic  pressure is 123456 mmHg. Left Atrium: Left atrial size was normal in size. Right Atrium: Right atrial size was normal in size. Pericardium: Trivial pericardial effusion is present. Mitral Valve: The mitral valve is normal in structure. Trivial mitral valve regurgitation. No evidence of mitral valve stenosis. Tricuspid Valve: The tricuspid valve is normal in structure. Tricuspid valve regurgitation is trivial. No evidence of tricuspid stenosis. Aortic Valve: The aortic valve was not well visualized. Aortic valve regurgitation is not visualized. No aortic stenosis is present. Pulmonic Valve: The pulmonic valve was normal in structure. Pulmonic valve regurgitation is trivial. No evidence of pulmonic stenosis. Aorta: The aortic root is normal in size and structure. Venous: The inferior vena cava is normal in size with greater than 50% respiratory variability, suggesting right atrial pressure of 3 mmHg. IAS/Shunts: No atrial level shunt detected by color flow Doppler.  LEFT VENTRICLE PLAX 2D LVIDd:         4.00 cm   Diastology LVIDs:         2.40 cm   LV e' medial:  8.92 cm/s LV PW:         0.80  cm   LV e' lateral: 7.51 cm/s LV IVS:        0.70 cm LVOT diam:     2.00 cm LV SV:         60 LV SV Index:   32 LVOT Area:     3.14 cm  RIGHT VENTRICLE             IVC RV S prime:     14.00 cm/s  IVC diam: 1.40 cm TAPSE (M-mode): 2.5 cm LEFT ATRIUM           Index        RIGHT ATRIUM           Index LA diam:      2.60 cm 1.39 cm/m   RA Area:     10.40 cm LA Vol (A2C): 26.6 ml 14.17 ml/m  RA Volume:   20.70 ml  11.03 ml/m  AORTIC VALVE LVOT Vmax:   86.60 cm/s LVOT Vmean:  58.200 cm/s LVOT VTI:    0.191 m  AORTA Ao Root diam: 3.10 cm TRICUSPID VALVE TR Peak grad:   16.3 mmHg TR Vmax:        202.00 cm/s  SHUNTS Systemic VTI:  0.19 m Systemic Diam: 2.00 cm Cherlynn Kaiser MD Electronically signed by Cherlynn Kaiser MD Signature Date/Time: 04/11/2022/10:49:24 AM    Final    MR ANGIO HEAD WO CONTRAST  Result Date:  04/10/2022 CLINICAL DATA:  Follow-up examination for acute stroke, status post TNK and thrombectomy. EXAM: MRI HEAD WITHOUT CONTRAST MRA HEAD WITHOUT CONTRAST TECHNIQUE: Multiplanar, multi-echo pulse sequences of the brain and surrounding structures were acquired without intravenous contrast. Angiographic images of the Circle of Willis were acquired using MRA technique without intravenous contrast. COMPARISON:  Comparison made with prior CTs from 04/09/2022. FINDINGS: MRI HEAD FINDINGS Brain: Cerebral volume within normal limits. No significant cerebral white matter disease for age. Restricted diffusion involving the right caudate and lentiform nuclei, consistent with acute right MCA distribution infarct (series 2, image 32). No associated hemorrhage or significant regional mass effect. No other evidence for acute or subacute ischemia elsewhere within the brain. Gray-white matter differentiation otherwise maintained. No acute intracranial hemorrhage. Single punctate chronic microhemorrhage noted at the right occipital region, of doubtful significance in isolation. Incidental small lipoma noted at the left quadrigeminal plate cistern. No other mass lesion, midline shift, or mass effect. Ventricles normal size without hydrocephalus. No extra-axial fluid collection. Possible but are an arachnoid cyst measuring 8 mm noted at the sellar/suprasellar region (series 5, image 13). This is of doubtful significance. Pituitary gland and suprasellar region otherwise unremarkable. Vascular: Major intracranial vascular flow voids are maintained. Skull and upper cervical spine: Craniocervical junction within normal limits. Bone marrow signal intensity normal. No scalp soft tissue abnormality. Sinuses/Orbits: Probable small dacryocystocele at the medial left orbit measures 9 mm (series 6, image 9). No associated inflammatory changes. Globes and orbital soft tissues otherwise unremarkable. Few small maxillary sinus retention cyst  noted. Paranasal sinuses are otherwise largely clear. No mastoid effusion. Other: Endotracheal and enteric tubes in place. MRA HEAD FINDINGS Anterior circulation: Both internal carotid arteries are widely patent to the termini without stenosis or other abnormality. A1 segments patent bilaterally. Right A1 hypoplastic. Normal anterior communicating artery complex. Both ACAs remain patent to their distal aspects. There has been interval revascularization of previously seen right M1 occlusion. Right MCA and its distal branches are now widely patent and well perfused. Left M1 segment and its distal branches remain widely  patent and well perfused as well. Posterior circulation: Vertebral arteries are diminutive but patent without stenosis. Both PICA patent. Basilar markedly diminutive but patent to its distal aspect without appreciable stenosis. Right AICA of supplied via the right vertebral artery. Superior cerebellar arteries patent bilaterally. Fetal type origin of the PCAs, both of which remain widely patent to their distal aspects. Anatomic variants: As above.  No aneurysm. IMPRESSION: MRI HEAD IMPRESSION: 1. 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 No associated hemorrhage or significant regional mass effect. 2. Otherwise normal brain MRI for age. MRA HEAD IMPRESSION: 1. Interval revascularization of previously seen right M1 occlusion. Right MCA and its distal branches are now widely patent and well perfused. 2. Otherwise stable and normal intracranial MRA. Electronically Signed   By: Jeannine Boga M.D.   On: 04/10/2022 23:09   MR BRAIN WO CONTRAST  Result Date: 04/10/2022 CLINICAL DATA:  Follow-up examination for acute stroke, status post TNK and thrombectomy. EXAM: MRI HEAD WITHOUT CONTRAST MRA HEAD WITHOUT CONTRAST TECHNIQUE: Multiplanar, multi-echo pulse sequences of the brain and surrounding structures were acquired without intravenous contrast.  Angiographic images of the Circle of Willis were acquired using MRA technique without intravenous contrast. COMPARISON:  Comparison made with prior CTs from 04/09/2022. FINDINGS: MRI HEAD FINDINGS Brain: Cerebral volume within normal limits. No significant cerebral white matter disease for age. Restricted diffusion involving the right caudate and lentiform nuclei, consistent with acute right MCA distribution infarct (series 2, image 32). No associated hemorrhage or significant regional mass effect. No other evidence for acute or subacute ischemia elsewhere within the brain. Gray-white matter differentiation otherwise maintained. No acute intracranial hemorrhage. Single punctate chronic microhemorrhage noted at the right occipital region, of doubtful significance in isolation. Incidental small lipoma noted at the left quadrigeminal plate cistern. No other mass lesion, midline shift, or mass effect. Ventricles normal size without hydrocephalus. No extra-axial fluid collection. Possible but are an arachnoid cyst measuring 8 mm noted at the sellar/suprasellar region (series 5, image 13). This is of doubtful significance. Pituitary gland and suprasellar region otherwise unremarkable. Vascular: Major intracranial vascular flow voids are maintained. Skull and upper cervical spine: Craniocervical junction within normal limits. Bone marrow signal intensity normal. No scalp soft tissue abnormality. Sinuses/Orbits: Probable small dacryocystocele at the medial left orbit measures 9 mm (series 6, image 9). No associated inflammatory changes. Globes and orbital soft tissues otherwise unremarkable. Few small maxillary sinus retention cyst noted. Paranasal sinuses are otherwise largely clear. No mastoid effusion. Other: Endotracheal and enteric tubes in place. MRA HEAD FINDINGS Anterior circulation: Both internal carotid arteries are widely patent to the termini without stenosis or other abnormality. A1 segments patent bilaterally.  Right A1 hypoplastic. Normal anterior communicating artery complex. Both ACAs remain patent to their distal aspects. There has been interval revascularization of previously seen right M1 occlusion. Right MCA and its distal branches are now widely patent and well perfused. Left M1 segment and its distal branches remain widely patent and well perfused as well. Posterior circulation: Vertebral arteries are diminutive but patent without stenosis. Both PICA patent. Basilar markedly diminutive but patent to its distal aspect without appreciable stenosis. Right AICA of supplied via the right vertebral artery. Superior cerebellar arteries patent bilaterally. Fetal type origin of the PCAs, both of which remain widely patent to their distal aspects. Anatomic variants: As above.  No aneurysm. IMPRESSION: MRI HEAD IMPRESSION: 1. 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 No associated hemorrhage or  significant regional mass effect. 2. Otherwise normal brain MRI for age. MRA HEAD IMPRESSION: 1. Interval revascularization of previously seen right M1 occlusion. Right MCA and its distal branches are now widely patent and well perfused. 2. Otherwise stable and normal intracranial MRA. Electronically Signed   By: Jeannine Boga M.D.   On: 04/10/2022 23:09   VAS [place] GROIN PSEUDOANEURYSM  Result Date: 04/10/2022  ARTERIAL PSEUDOANEURYSM  Patient Name:  Patricia [name]  Date of Exam:   04/10/2022 Medical Rec #: [id number]        Accession #:    [id number] Date of Birth: [birth year]        Patient Gender: F Patient Age:   60 years Exam Location:  [facility] Procedure:      VAS [place] GROIN PSEUDOANEURYSM Referring Phys: Luanne Bras --------------------------------------------------------------------------------  Exam: Right groin Indications: Patient complains of oozing from stick site. 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 Comparison Study: No prior study Performing Technologist: Sharion Dove RVS  Examination Guidelines: A complete evaluation includes B-mode imaging, spectral Doppler, color Doppler, and power Doppler as needed of all accessible portions of each vessel. Bilateral testing is considered an integral part of a complete examination. Limited examinations for reoccurring indications may be performed as noted.  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 The neck measures approximately 0.4 cm wide and 0.4 cm long.  Summary: Partially thrombosed pseudoaneurysm arising off the proximal SFA noted in the right proximal thigh/groin area. Diagnosing physician: Harold Barban MD Electronically signed by Harold Barban MD on 04/10/2022 at 8:13:26 PM.   --------------------------------------------------------------------------------    Final    DG CHEST PORT 1 VIEW  Result Date: 04/10/2022 CLINICAL DATA:  Endotracheal tube placement EXAM: PORTABLE CHEST 1 VIEW COMPARISON:  Chest x-ray October 30, 2019 FINDINGS: The endotracheal tube terminates approximately 3.5 cm above the carina. The NG tube side port terminates below the diaphragm in the distal tip is below the field of view. The cardiomediastinal silhouette is unchanged in contour. No focal pulmonary opacity. No pleural effusion or pneumothorax. The visualized upper abdomen is unremarkable. No acute osseous abnormality. IMPRESSION: 1. Adequate positioning of the endotracheal tube and NG tube. 2. No acute cardiopulmonary abnormality. Electronically Signed   By: Beryle Flock M.D.   On: 04/10/2022 14:04   DG Abd Portable 1V  Result Date: 04/10/2022 CLINICAL DATA:  OG tube placement. EXAM: PORTABLE ABDOMEN - 1 VIEW COMPARISON:   None Available. FINDINGS: OG tube tip is in the mid stomach. Proximal side port is at or just below the level of the GE junction. Tube could be advanced another 3-4 cm to ensure side port is below the GE junction, as clinically warranted visualized abdomen demonstrates nonspecific gas pattern. Contrast excretion noted from both kidneys compatible with contrast infused studies from 04/09/2022. IMPRESSION: OG tube tip is in the mid stomach. Proximal side port is at or just below the level of the GE junction. See above. Electronically Signed   By: Misty Stanley M.D.   On: 04/10/2022 07:34   CT HEAD WO CONTRAST (5MM)  Result Date: 04/10/2022 CLINICAL DATA:  Stroke follow-up. EXAM: CT HEAD WITHOUT CONTRAST TECHNIQUE: Contiguous axial images were obtained from the base of the skull through the vertex  without intravenous contrast. RADIATION DOSE REDUCTION: This exam was performed according to the departmental dose-optimization program which includes automated exposure control, adjustment of the mA and/or kV according to patient size and/or use of iterative reconstruction technique. COMPARISON:  Head CT from yesterday FINDINGS: Brain: No evidence of acute infarction, hemorrhage, hydrocephalus, extra-axial collection or mass lesion/mass effect. Vascular: Prominent density of right M1 segment but only at the level of streak artifact on coronal reformats. Skull: Normal. Negative for fracture or focal lesion. Sinuses/Orbits: Negative IMPRESSION: Negative for hemorrhage or visible infarct. Electronically Signed   By: Jorje Guild M.D.   On: 04/10/2022 04:02   CT ANGIO HEAD NECK W WO CM (CODE STROKE)  Result Date: 04/09/2022 CLINICAL DATA:  Initial evaluation for neuro deficit, stroke. EXAM: CT ANGIOGRAPHY HEAD AND NECK TECHNIQUE: Multidetector CT imaging of the head and neck was performed using the standard protocol during bolus administration of intravenous contrast. Multiplanar CT image reconstructions and MIPs were  obtained to evaluate the vascular anatomy. Carotid stenosis measurements (when applicable) are obtained utilizing NASCET criteria, using the distal internal carotid diameter as the denominator. RADIATION DOSE REDUCTION: This exam was performed according to the departmental dose-optimization program which includes automated exposure control, adjustment of the mA and/or kV according to patient size and/or use of iterative reconstruction technique. CONTRAST:  21m OMNIPAQUE IOHEXOL 350 MG/ML SOLN COMPARISON:  Prior CT from earlier the same day. FINDINGS: CTA NECK FINDINGS Aortic arch: Visualized aortic arch normal caliber with standard branch pattern. No stenosis about the origin of the great vessels. Right carotid system: Right common and internal carotid arteries are patent without dissection. Transverse filling defect involving the proximal cervical right ICA just distal to the bifurcation most characteristic of a carotid web (series 9, image 52). Associated short-segment stenosis of up to 55% by NASCET criteria. Left carotid system: Left common and internal carotid arteries are patent without stenosis or dissection. Vertebral arteries: Both vertebral arteries arise from the subclavian arteries. No proximal subclavian artery stenosis. Vertebral arteries are diminutive but patent without stenosis or dissection. Skeleton: No discrete or worrisome osseous lesions. Mild-to-moderate spondylosis noted at C5-6 and C6-7. Other neck: No other acute soft tissue abnormality within the neck. 1.8 cm heterogeneous left thyroid noted This has been evaluated on previous imaging, most recently with prior ultrasound from 07/26/2017. (Ref: J Am Coll Radiol. 2015 Feb;12(2): 143-50). Upper chest:  Visualized upper chest demonstrates no acute finding. Review of the MIP images confirms the above findings CTA HEAD FINDINGS Anterior circulation: Both internal carotid arteries are patent to the termini without stenosis. A1 segments patent  bilaterally. Normal anterior communicating artery complex. Anterior cerebral arteries patent bilaterally. Left M1 segment widely patent. No proximal left MCA branch occlusion or high-grade stenosis. Distal left MCA branches well perfused. On the right, there is acute occlusion of the mid and distal right M1 segment, corresponding with hyperdense vessel on prior noncontrast CT (series 7, image 98). Attenuated flow within the right MCA territory distally, likely collateral nature. Posterior circulation: V4 segments diminutive but widely patent without stenosis. Both PICA patent. Basilar fenestrated and markedly diminutive but patent without stenosis. Superior cerebral arteries patent bilaterally. Fetal type origin of the PCAs, both of which are widely patent to their distal aspects. Venous sinuses: Patent allowing for timing the contrast bolus. Anatomic variants: Fetal type origin of the PCAs with overall diminutive vertebrobasilar system. No aneurysm. Review of the MIP images confirms the above findings IMPRESSION: 1. Positive CTA for emergent large vessel occlusion, with  acute occlusion of the mid and distal right M1 segment. 2. Transverse filling defect involving the proximal cervical right ICA just distal to the bifurcation, most characteristic of a carotid web. Associated short-segment stenosis of up to 55% by NASCET criteria. 3. Fetal type origin of the PCAs with overall diminutive vertebrobasilar system. These results were communicated to Dr. Curly Shores at 10:42 pm on 04/09/2022 by text page via the [facility] messaging system. Electronically Signed   By: Jeannine Boga M.D.   On: 04/09/2022 22:55   CT HEAD CODE STROKE WO CONTRAST  Result Date: 04/09/2022 CLINICAL DATA:  Code stroke. Initial evaluation for neuro deficit, stroke. EXAM: CT HEAD WITHOUT CONTRAST TECHNIQUE: Contiguous axial images were obtained from the base of the skull through the vertex without intravenous contrast. RADIATION DOSE REDUCTION:  This exam was performed according to the departmental dose-optimization program which includes automated exposure control, adjustment of the mA and/or kV according to patient size and/or use of iterative reconstruction technique. COMPARISON:  None Available. FINDINGS: Brain: Cerebral volume within normal limits. No acute intracranial hemorrhage. There is subtle loss of gray-white matter differentiation involving the right insula as well as the right caudate and lentiform nuclei, concerning for early acute right MCA territory infarct. Gray-white matter differentiation otherwise grossly maintained. No mass lesion or midline shift. No hydrocephalus or extra-axial fluid collection. Tiny lipoma noted near the quadrigeminal plate cistern. Vascular: Dense right M1 segment, concerning for thrombus in large vessel occlusion. Skull: Scalp soft tissues and calvarium demonstrate no acute finding. Sinuses/Orbits: Right gaze preference noted. Paranasal sinuses are largely clear. No mastoid effusion. Other: None. ASPECTS [facility] Stroke Program Early CT Score) - Ganglionic level infarction (caudate, lentiform nuclei, internal capsule, insula, M1-M3 cortex): 4 - Supraganglionic infarction (M4-M6 cortex): 3 Total score (0-10 with 10 being normal): 7 IMPRESSION: 1. Dense right M1 segment, concerning for thrombus/large vessel occlusion. 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 No intracranial hemorrhage. 2. ASPECTS is 7. These results were communicated to Dr. Curly Shores at 10:11 pm on 04/09/2022 by text page via the [facility] messaging system. Electronically Signed   By: Jeannine Boga M.D.   On: 04/09/2022 22:16    Labs:  CBC: Recent Labs    04/09/22 2150 04/09/22 2213 04/10/22 0424 04/11/22 0518  WBC 9.1  --  9.1 9.7  HGB 12.1 11.9* 11.5* 9.7*  HCT 33.5* 35.0* 31.7* 28.3*  PLT 226  --  247 180    COAGS: Recent Labs    04/09/22 2150  INR 0.9  APTT 31    BMP: Recent Labs    04/09/22 2150  04/09/22 2213 04/10/22 0424 04/10/22 2111 04/11/22 0518  NA 135 140 138  --  139  K 3.1* 3.1* 3.0* 3.8 3.3*  CL 101 101 104  --  110  CO2 26  --  24  --  22  GLUCOSE 110* 107* 173*  --  104*  BUN 14 16 11  $ --  9  CALCIUM 9.0  --  8.2*  --  7.9*  CREATININE 1.01* 1.00 0.87  --  0.94  GFRNONAA >60  --  >60  --  >60    LIVER FUNCTION TESTS: Recent Labs    04/09/22 2150  BILITOT 0.7  AST 25  ALT 11  ALKPHOS 84  PROT 6.6  ALBUMIN 3.8    Assessment and Plan:  57 y.o. female inpatient. Smoker. History of HTN and asthma. Presented to ED  at [facility] with a sudden onset left arm numbness. Code  stroke was activated. 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 Estanislado Pandy on 2.11.24. Due to concern for post procedural CVA pseudoaneurysm  [place] study was performed on 2.11.24. 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 Repeat US performed on 2.12.24 found no evidence of right groin pseudoaneurysm or AVF. Patient was extubated on 2.12.24. Patient seen at bedside with Dr. Luanne Bras.  No neurological deficits noted.    IR scheduler will call with appointment date and time. Please call [phone] with any questions or concerns   Electronically Signed: Jacqualine Mau, NP 04/12/2022, 8:31 AM   I spent a total of 15 Minutes at the patient's bedside AND on the patient's hospital floor or unit, greater than 50% of which was counseling/coordinating care for MCA thrombectomy with complete revascularization

## 2022-04-12 NOTE — Progress Notes (Signed)
Physical Therapy Treatment Patient Details Name: Patricia [name] MRN: [id number] DOB: 1966-02-10 Today's Date: 04/12/2022   History of Present Illness Patricia [name] is a 57 y.o. female who presented after sudden onset of L arm numbness. 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 S/P TNKase, and s/p mechanical thrombectomy to occluded R MCA M1 and R ICA proximal stent. 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 Extubated 2/12. PMHx: smoking, hypertension, asthma.    PT Comments    Patient progressing with mobility walking further and able to manage at times without HHA though mother eager to assist.  Seems to have needed equipment at home for her shower, though doubt she will need a walking device.  Patient appropriate for HHPT though likely will not need for long.  PT will follow up if not d/c though pt/mother state plans for d/c home today.    Recommendations for follow up therapy are one component of a multi-disciplinary discharge planning process, led by the attending physician.  Recommendations may be updated based on patient status, additional functional criteria and insurance authorization.  Follow Up Recommendations  Home health PT     Assistance Recommended at Discharge Intermittent Supervision/Assistance  Patient can return home with the following     Equipment Recommendations  None recommended by PT    Recommendations for Other Services       Precautions / Restrictions Precautions Precautions: Fall Restrictions Weight Bearing Restrictions: No     Mobility  Bed Mobility               General bed mobility comments: up in chair    Transfers Overall transfer level: Needs assistance Equipment used: None Transfers: Sit to/from Stand Sit to Stand: Min guard           General transfer comment: assist for safety/lines    Ambulation/Gait Ambulation/Gait assistance: Supervision, Min  guard Gait Distance (Feet): 200 Feet Assistive device: None, 1 person hand held assist Gait Pattern/deviations: Step-through pattern, Decreased stride length, Wide base of support       General Gait Details: slow and guarded, mother assisting on L arm; occasional minguard from PT for direction only   Stairs             Wheelchair Mobility    Modified Rankin (Stroke Patients Only) Modified Rankin (Stroke Patients Only) Pre-Morbid Rankin Score: No symptoms Modified Rankin: Moderately severe disability     Balance Overall balance assessment: Needs assistance   Sitting balance-Leahy Scale: Good       Standing balance-Leahy Scale: Fair                              Cognition Arousal/Alertness: Awake/alert Behavior During Therapy: WFL for tasks assessed/performed Overall Cognitive Status: Within Functional Limits for tasks assessed                                          Exercises      General Comments General comments (skin integrity, edema, etc.): VSS with mobility though some dyspnea noted with ambulation.  Educated in safety at home with sitting several minutes prior to standing, footwear, lighting and ensured from mother pt will have seat in her shower.  Educated verbally on technique on stairs for home entry with pt and  mother.      Pertinent Vitals/Pain Pain Assessment Faces Pain Scale: Hurts a little bit Pain Location: R groin at IR site Pain Descriptors / Indicators: Discomfort Pain Intervention(s): Monitored during session    Home Living                          Prior Function            PT Goals (current goals can now be found in the care plan section) Progress towards PT goals: Progressing toward goals    Frequency    Min 4X/week      PT Plan Current plan remains appropriate    Co-evaluation              AM-PAC PT "6 Clicks" Mobility   Outcome Measure  Help needed turning from your  back to your side while in a flat bed without using bedrails?: A Little Help needed moving from lying on your back to sitting on the side of a flat bed without using bedrails?: A Little Help needed moving to and from a bed to a chair (including a wheelchair)?: A Little Help needed standing up from a chair using your arms (e.g., wheelchair or bedside chair)?: A Little Help needed to walk in hospital room?: A Little Help needed climbing 3-5 steps with a railing? : A Little 6 Click Score: 18    End of Session Equipment Utilized During Treatment: Gait belt Activity Tolerance: Patient tolerated treatment well Patient left: in chair;with call bell/phone within reach;with family/visitor present   PT Visit Diagnosis: Unsteadiness on feet (R26.81);Difficulty in walking, not elsewhere classified (R26.2)     Time: [id number] PT Time Calculation (min) (ACUTE ONLY): 20 min  Charges:  $Gait Training: 8-22 mins                     Magda Kiel, PT Acute Rehabilitation Services Office:[phone] 04/12/2022    Reginia Naas 04/12/2022, 10:45 AM

## 2022-04-15 ENCOUNTER — Telehealth: Payer: Self-pay

## 2022-04-15 NOTE — Patient Outreach (Signed)
Received a red flag Emmi stroke notification for Patricia [name]. I have assigned Enzo Montgomery, RN to call for follow up and determine if there are any Case Management needs.    [facility], [place], [place] Management [phone]

## 2022-04-15 NOTE — Patient Outreach (Signed)
  Emmi Stroke Care Coordination Follow Up  04/15/2022 Name:  SHONTAYE OYAMA MRN:  [id number] DOB:  May 14, 1965  Subjective: DARNECIA BEEM is a 57 y.o. year old female who is a primary care patient of [facility] [place]   An Emmi alert was received  on 04/14/22 indicating patient responded to questions: Feeling worse overall? New or worsening pain/fever/shortness of breath?. I reached out by phone to follow up on the alert.  Care Coordination Interventions:  No, not indicated   Follow up plan:  RN CM will make outreach attempt to patient.    Encounter Outcome:  No Answer    Enzo Montgomery, RN,BSN,CCM [place] Management Telephonic Care Management Coordinator Direct Phone: [phone] Toll Free: [phone] Fax: [phone]

## 2022-04-18 ENCOUNTER — Telehealth: Payer: Self-pay

## 2022-04-18 NOTE — Patient Outreach (Signed)
  Emmi Stroke Care Coordination Follow Up  04/18/2022 Name:  Patricia [name] MRN:  [id number] DOB:  April 25, 1965  Subjective: Patricia [name] is a 57 y.o. year old female who is a primary care patient of [facility] [place]    An Emmi alert was received on 04/14/22 indicating patient responded to questions: Feeling worse overall? New or worsening pain/fever/shortness of breath?.   I reached out by phone to follow up on the alert.  Care Coordination Interventions:  No, not indicated   Follow up plan:  RN CM will make additional outreach attempt to patient.    Encounter Outcome:  No Answer     Enzo Montgomery, RN,BSN,CCM [place] Management Telephonic Care Management Coordinator Direct Phone: [phone] Toll Free: [phone] Fax: [phone]

## 2022-04-19 ENCOUNTER — Telehealth: Payer: Self-pay

## 2022-04-19 NOTE — Patient Outreach (Signed)
  Emmi Stroke Care Coordination Follow Up  04/19/2022 Name:  Patricia [name] MRN:  [id number] DOB:  [birth year]  Subjective: Patricia [name] is a 57 y.o. year old female who is a primary care patient of [facility] [place]   An Emmi alert was received on 04/14/22 indicating patient responded to questions: Feeling worse overall? New or worsening pain/fever/shortness of breath?.   I reached out by phone to follow up on the alert and spoke to Patient.Patient voices she is doing okay. She states she responded that way the other day as she had been out to PCP follow up appt then went shopping. She was wore out and felt weak and tired. She is learning how to pace herself and take rest periods until she regains her previous level of functioning. She is using walker to help with ambulation. Patient confirms she has all her meds and no issues regarding them. She has neuro appt scheduled. States her mother is assisting and taking her to appts.  Care Coordination Interventions:  Yes, provided   Follow up plan: Advised patient that they would continue to get automated EMMI-Stroke post discharge calls to assess how they are doing following recent hospitalization and will receive a call from a nurse if any of their responses were abnormal. Patient voiced understanding and was appreciative of f/u call.   Encounter Outcome:  Pt. Visit Completed   Enzo Montgomery, RN,BSN,CCM [place] Management Telephonic Care Management Coordinator Direct Phone: [phone] Toll Free: [phone] Fax: [phone]

## 2022-04-23 ENCOUNTER — Encounter: Payer: Self-pay | Admitting: Internal Medicine

## 2022-04-23 ENCOUNTER — Inpatient Hospital Stay
Admission: EM | Admit: 2022-04-23 | Discharge: 2022-04-26 | DRG: 392 | Disposition: A | Discharge status: Home / Self Care | Payer: 59 | Attending: Student | Admitting: Student

## 2022-04-23 ENCOUNTER — Other Ambulatory Visit: Payer: Self-pay

## 2022-04-23 ENCOUNTER — Emergency Department: Payer: 59

## 2022-04-23 DIAGNOSIS — E860 Dehydration: Secondary | ICD-10-CM | POA: Diagnosis present

## 2022-04-23 DIAGNOSIS — R55 Syncope and collapse: Secondary | ICD-10-CM | POA: Diagnosis present

## 2022-04-23 DIAGNOSIS — I10 Essential (primary) hypertension: Secondary | ICD-10-CM | POA: Diagnosis present

## 2022-04-23 DIAGNOSIS — Z1152 Encounter for screening for COVID-19: Secondary | ICD-10-CM

## 2022-04-23 DIAGNOSIS — I639 Cerebral infarction, unspecified: Secondary | ICD-10-CM | POA: Diagnosis present

## 2022-04-23 DIAGNOSIS — Z8673 Personal history of transient ischemic attack (TIA), and cerebral infarction without residual deficits: Secondary | ICD-10-CM

## 2022-04-23 DIAGNOSIS — Z9851 Tubal ligation status: Secondary | ICD-10-CM

## 2022-04-23 DIAGNOSIS — E785 Hyperlipidemia, unspecified: Secondary | ICD-10-CM | POA: Diagnosis present

## 2022-04-23 DIAGNOSIS — J45909 Unspecified asthma, uncomplicated: Secondary | ICD-10-CM | POA: Diagnosis present

## 2022-04-23 DIAGNOSIS — R197 Diarrhea, unspecified: Secondary | ICD-10-CM | POA: Insufficient documentation

## 2022-04-23 DIAGNOSIS — E876 Hypokalemia: Secondary | ICD-10-CM | POA: Diagnosis present

## 2022-04-23 DIAGNOSIS — Z79899 Other long term (current) drug therapy: Secondary | ICD-10-CM

## 2022-04-23 DIAGNOSIS — N179 Acute kidney failure, unspecified: Secondary | ICD-10-CM | POA: Diagnosis present

## 2022-04-23 DIAGNOSIS — Z7982 Long term (current) use of aspirin: Secondary | ICD-10-CM

## 2022-04-23 DIAGNOSIS — K529 Noninfective gastroenteritis and colitis, unspecified: Secondary | ICD-10-CM | POA: Diagnosis not present

## 2022-04-23 DIAGNOSIS — E559 Vitamin D deficiency, unspecified: Secondary | ICD-10-CM | POA: Diagnosis present

## 2022-04-23 DIAGNOSIS — A0839 Other viral enteritis: Secondary | ICD-10-CM | POA: Diagnosis not present

## 2022-04-23 DIAGNOSIS — E871 Hypo-osmolality and hyponatremia: Secondary | ICD-10-CM | POA: Diagnosis present

## 2022-04-23 DIAGNOSIS — R569 Unspecified convulsions: Secondary | ICD-10-CM | POA: Diagnosis present

## 2022-04-23 DIAGNOSIS — Z8249 Family history of ischemic heart disease and other diseases of the circulatory system: Secondary | ICD-10-CM

## 2022-04-23 DIAGNOSIS — J4520 Mild intermittent asthma, uncomplicated: Secondary | ICD-10-CM | POA: Diagnosis not present

## 2022-04-23 DIAGNOSIS — A0831 Calicivirus enteritis: Secondary | ICD-10-CM | POA: Diagnosis present

## 2022-04-23 DIAGNOSIS — I951 Orthostatic hypotension: Secondary | ICD-10-CM

## 2022-04-23 DIAGNOSIS — Z9049 Acquired absence of other specified parts of digestive tract: Secondary | ICD-10-CM

## 2022-04-23 DIAGNOSIS — F32A Depression, unspecified: Secondary | ICD-10-CM | POA: Diagnosis present

## 2022-04-23 DIAGNOSIS — R112 Nausea with vomiting, unspecified: Secondary | ICD-10-CM

## 2022-04-23 HISTORY — DX: Hyperlipidemia, unspecified: E78.5

## 2022-04-23 HISTORY — DX: Cerebral infarction, unspecified: I63.9

## 2022-04-23 LAB — PROTIME-INR
INR: 1 (ref 0.8–1.2)
Prothrombin Time: 13 seconds (ref 11.4–15.2)

## 2022-04-23 LAB — CBC WITH DIFFERENTIAL/PLATELET
Abs Immature Granulocytes: 0.04 10*3/uL (ref 0.00–0.07)
Basophils Absolute: 0 10*3/uL (ref 0.0–0.1)
Basophils Relative: 0 %
Eosinophils Absolute: 0 10*3/uL (ref 0.0–0.5)
Eosinophils Relative: 0 %
HCT: 39.4 % (ref 36.0–46.0)
Hemoglobin: 13.7 g/dL (ref 12.0–15.0)
Immature Granulocytes: 0 %
Lymphocytes Relative: 13 %
Lymphs Abs: 1.2 10*3/uL (ref 0.7–4.0)
MCH: 31.9 pg (ref 26.0–34.0)
MCHC: 34.8 g/dL (ref 30.0–36.0)
MCV: 91.8 fL (ref 80.0–100.0)
Monocytes Absolute: 1 10*3/uL (ref 0.1–1.0)
Monocytes Relative: 11 %
Neutro Abs: 6.8 10*3/uL (ref 1.7–7.7)
Neutrophils Relative %: 76 %
Platelets: 353 10*3/uL (ref 150–400)
RBC: 4.29 MIL/uL (ref 3.87–5.11)
RDW: 11.7 % (ref 11.5–15.5)
WBC: 9 10*3/uL (ref 4.0–10.5)
nRBC: 0 % (ref 0.0–0.2)

## 2022-04-23 LAB — GASTROINTESTINAL PANEL BY PCR, STOOL (REPLACES STOOL CULTURE)

## 2022-04-23 LAB — COMPREHENSIVE METABOLIC PANEL
ALT: 14 U/L (ref 0–44)
AST: 34 U/L (ref 15–41)
Albumin: 4.5 g/dL (ref 3.5–5.0)
Alkaline Phosphatase: 109 U/L (ref 38–126)
Anion gap: 14 (ref 5–15)
BUN: 18 mg/dL (ref 6–20)
CO2: 29 mmol/L (ref 22–32)
Calcium: 9.6 mg/dL (ref 8.9–10.3)
Chloride: 88 mmol/L — ABNORMAL LOW (ref 98–111)
Creatinine, Ser: 1.55 mg/dL — ABNORMAL HIGH (ref 0.44–1.00)
GFR, Estimated: 40 mL/min — ABNORMAL LOW (ref >60)
Glucose, Bld: 164 mg/dL — ABNORMAL HIGH (ref 70–99)
Potassium: 2.7 mmol/L — CL (ref 3.5–5.1)
Sodium: 131 mmol/L — ABNORMAL LOW (ref 135–145)
Total Bilirubin: 0.8 mg/dL (ref 0.3–1.2)
Total Protein: 8.4 g/dL — ABNORMAL HIGH (ref 6.5–8.1)

## 2022-04-23 LAB — URINE DRUG SCREEN, QUALITATIVE (ARMC ONLY)
Amphetamines, Ur Screen: NOT DETECTED
Barbiturates, Ur Screen: NOT DETECTED
Benzodiazepine, Ur Scrn: NOT DETECTED
Cannabinoid 50 Ng, Ur ~~LOC~~: POSITIVE — AB
Cocaine Metabolite,Ur ~~LOC~~: NOT DETECTED
MDMA (Ecstasy)Ur Screen: NOT DETECTED
Methadone Scn, Ur: NOT DETECTED
Opiate, Ur Screen: NOT DETECTED
Phencyclidine (PCP) Ur S: NOT DETECTED
Tricyclic, Ur Screen: NOT DETECTED

## 2022-04-23 LAB — URINALYSIS, ROUTINE W REFLEX MICROSCOPIC
Bilirubin Urine: NEGATIVE
Glucose, UA: NEGATIVE mg/dL
Ketones, ur: NEGATIVE mg/dL
Leukocytes,Ua: NEGATIVE
Nitrite: POSITIVE — AB
Protein, ur: NEGATIVE mg/dL
Specific Gravity, Urine: 1.02 (ref 1.005–1.030)
pH: 6 (ref 5.0–8.0)

## 2022-04-23 LAB — RESP PANEL BY RT-PCR (RSV, FLU A&B, COVID)  RVPGX2
Influenza A by PCR: NEGATIVE
Influenza B by PCR: NEGATIVE
Resp Syncytial Virus by PCR: NEGATIVE
SARS Coronavirus 2 by RT PCR: NEGATIVE

## 2022-04-23 LAB — PREGNANCY, URINE: Preg Test, Ur: NEGATIVE

## 2022-04-23 LAB — TROPONIN I (HIGH SENSITIVITY): Troponin I (High Sensitivity): 5 ng/L (ref <18)

## 2022-04-23 LAB — LIPASE, BLOOD: Lipase: 29 U/L (ref 11–51)

## 2022-04-23 LAB — MAGNESIUM: Magnesium: 2 mg/dL (ref 1.7–2.4)

## 2022-04-23 LAB — C DIFFICILE QUICK SCREEN W PCR REFLEX
C Diff antigen: NEGATIVE
C Diff interpretation: NOT DETECTED
C Diff toxin: NEGATIVE

## 2022-04-23 LAB — PHOSPHORUS: Phosphorus: 3.8 mg/dL (ref 2.5–4.6)

## 2022-04-23 MED ORDER — ASPIRIN 81 MG PO CHEW
81.0000 mg | CHEWABLE_TABLET | Freq: Every day | ORAL | Status: DC
  Administered 2022-04-23 – 2022-04-26 (×4): 81 mg via ORAL
  Filled 2022-04-23 (×4): qty 1

## 2022-04-23 MED ORDER — POTASSIUM CHLORIDE 10 MEQ/100ML IV SOLN
10.0000 meq | INTRAVENOUS | Status: AC
  Administered 2022-04-23: 10 meq via INTRAVENOUS
  Filled 2022-04-23 (×2): qty 100

## 2022-04-23 MED ORDER — ROSUVASTATIN CALCIUM 20 MG PO TABS
20.0000 mg | ORAL_TABLET | Freq: Every day | ORAL | Status: DC
  Administered 2022-04-23 – 2022-04-26 (×4): 20 mg via ORAL
  Filled 2022-04-23 (×4): qty 1

## 2022-04-23 MED ORDER — ENOXAPARIN SODIUM 40 MG/0.4ML IJ SOSY
40.0000 mg | PREFILLED_SYRINGE | INTRAMUSCULAR | Status: DC
  Administered 2022-04-23 – 2022-04-25 (×3): 40 mg via SUBCUTANEOUS
  Filled 2022-04-23 (×3): qty 0.4

## 2022-04-23 MED ORDER — ACETAMINOPHEN 325 MG PO TABS
650.0000 mg | ORAL_TABLET | Freq: Four times a day (QID) | ORAL | Status: DC | PRN
  Administered 2022-04-23 – 2022-04-24 (×2): 650 mg via ORAL
  Filled 2022-04-23 (×2): qty 2

## 2022-04-23 MED ORDER — NICOTINE 21 MG/24HR TD PT24
21.0000 mg | MEDICATED_PATCH | Freq: Every day | TRANSDERMAL | Status: DC
  Filled 2022-04-23 (×3): qty 1

## 2022-04-23 MED ORDER — POTASSIUM CHLORIDE CRYS ER 20 MEQ PO TBCR
40.0000 meq | EXTENDED_RELEASE_TABLET | Freq: Once | ORAL | Status: AC
  Administered 2022-04-23: 40 meq via ORAL
  Filled 2022-04-23 (×2): qty 2

## 2022-04-23 MED ORDER — TICAGRELOR 90 MG PO TABS
90.0000 mg | ORAL_TABLET | Freq: Two times a day (BID) | ORAL | Status: DC
  Administered 2022-04-23 – 2022-04-26 (×7): 90 mg via ORAL
  Filled 2022-04-23 (×7): qty 1

## 2022-04-23 MED ORDER — CITALOPRAM HYDROBROMIDE 20 MG PO TABS
20.0000 mg | ORAL_TABLET | Freq: Every day | ORAL | Status: DC
  Administered 2022-04-23 – 2022-04-25 (×3): 20 mg via ORAL
  Filled 2022-04-23 (×3): qty 1

## 2022-04-23 MED ORDER — POTASSIUM CHLORIDE CRYS ER 20 MEQ PO TBCR
40.0000 meq | EXTENDED_RELEASE_TABLET | Freq: Once | ORAL | Status: AC
  Administered 2022-04-23: 40 meq via ORAL
  Filled 2022-04-23: qty 2

## 2022-04-23 MED ORDER — HYDRALAZINE HCL 20 MG/ML IJ SOLN: 5.0000 mg | INTRAMUSCULAR | Status: DC | PRN

## 2022-04-23 MED ORDER — POTASSIUM CHLORIDE 10 MEQ/100ML IV SOLN
10.0000 meq | Freq: Once | INTRAVENOUS | Status: AC
  Administered 2022-04-23: 10 meq via INTRAVENOUS

## 2022-04-23 MED ORDER — PIPERACILLIN-TAZOBACTAM 3.375 G IVPB
3.3750 g | Freq: Three times a day (TID) | INTRAVENOUS | Status: DC
  Administered 2022-04-23: 3.375 g via INTRAVENOUS

## 2022-04-23 MED ORDER — ALBUTEROL SULFATE (2.5 MG/3ML) 0.083% IN NEBU: 2.5000 mg | INHALATION_SOLUTION | RESPIRATORY_TRACT | Status: DC | PRN

## 2022-04-23 MED ORDER — IOHEXOL 300 MG/ML  SOLN
80.0000 mL | Freq: Once | INTRAMUSCULAR | Status: AC | PRN
  Administered 2022-04-23: 80 mL via INTRAVENOUS

## 2022-04-23 MED ORDER — DM-GUAIFENESIN ER 30-600 MG PO TB12: 1.0000 | ORAL_TABLET | Freq: Two times a day (BID) | ORAL | Status: DC | PRN

## 2022-04-23 MED ORDER — FENTANYL CITRATE PF 50 MCG/ML IJ SOSY
50.0000 ug | PREFILLED_SYRINGE | Freq: Once | INTRAMUSCULAR | Status: AC
  Administered 2022-04-23: 50 ug via INTRAVENOUS
  Filled 2022-04-23: qty 1

## 2022-04-23 MED ORDER — ONDANSETRON HCL 4 MG/2ML IJ SOLN
4.0000 mg | Freq: Once | INTRAMUSCULAR | Status: AC
  Administered 2022-04-23: 4 mg via INTRAVENOUS
  Filled 2022-04-23: qty 2

## 2022-04-23 MED ORDER — ONDANSETRON HCL 4 MG/2ML IJ SOLN: 4.0000 mg | Freq: Three times a day (TID) | INTRAMUSCULAR | Status: DC | PRN

## 2022-04-23 MED ORDER — SODIUM CHLORIDE 0.9 % IV SOLN: INTRAVENOUS | Status: DC

## 2022-04-23 MED ORDER — SODIUM CHLORIDE 0.9 % IV BOLUS (SEPSIS)
2000.0000 mL | Freq: Once | INTRAVENOUS | Status: AC
  Administered 2022-04-23: 2000 mL via INTRAVENOUS

## 2022-04-23 MED ORDER — LORAZEPAM 2 MG/ML IJ SOLN: 1.0000 mg | INTRAMUSCULAR | Status: DC | PRN

## 2022-04-23 MED ORDER — PIPERACILLIN-TAZOBACTAM 3.375 G IVPB 30 MIN
3.3750 g | Freq: Once | INTRAVENOUS | Status: DC
  Filled 2022-04-23: qty 50

## 2022-04-23 NOTE — ED Triage Notes (Signed)
Pt sts she had a syncopal episode earlier and then had another syncopal episode but mother told Ems that pt appeared to have a seizure during the 2nd syncopal episode. Pt had a stroke and was dc'd on 04/12/22 after a thrombectomy and stent placement. Pt is currently aaox4 and ambulatory on scene.

## 2022-04-23 NOTE — Consult Note (Signed)
Pharmacy Antibiotic Note  Patricia [name] is a 57 y.o. female admitted on 04/23/2022 with colitis. PMH significant for recent R MCA infarct (s/p TNK, thrombectomy & revascularization), asthma, bronchitis, HTN, HLD. She presented with syncope, nausea, vomiting, diarrhea. Pharmacy has been consulted for Zosyn dosing.  Plan: Day 1 of antibiotics Start Zosyn 3.375 g IV Q8H Continue to monitor renal function and follow culture results     Temp (24hrs), Avg:98.3 F (36.8 C), Min:97.9 F (36.6 C), Max:98.6 F (37 C)  Recent Labs  Lab 04/23/22 0543  WBC 9.0  CREATININE 1.55*    Estimated Creatinine Clearance: 44.7 mL/min (A) (by C-G formula based on SCr of 1.55 mg/dL (H)).    Allergies  Allergen Reactions   Tramadol Rash    Antimicrobials this admission: 2/24 Zosyn >>   Dose adjustments this admission: N/A  Microbiology results: 2/24 BCx: IP  Thank you for allowing pharmacy to be a part of this patient's care.  Gretel Acre, PharmD PGY1 Pharmacy Resident 04/23/2022 10:39 AM

## 2022-04-23 NOTE — H&P (Signed)
History and Physical    TEHRA DITSWORTH [id number] DOB: 1965/06/26 DOA: 04/23/2022  Referring MD/NP/PA:   PCP: [facility]   Patient coming from:  The patient is coming from home.    Chief Complaint: nausea, vomiting, diarrhea, possible syncope  HPI: Patricia [name] is a 57 y.o. female with medical history significant of HTN, HLD, asthma, stroke, depression, tobacco abuse, who presents with nausea, vomiting, diarrhea and possible syncope.  Per her mother at bedside, pt has nausea, vomiting, diarrhea since yesterday.  Patient has had multiple episodes of nonbilious nonbloody vomiting.  Patient has several episodes of watery diarrhea.  No blood in stool.  Initially patient had some central abdominal pain which has resolved.  Currently no abdominal pain.  No fever or chills.  Denies chest pain, cough, shortness breath.  Mother states that patient had 2 episodes of shaking of her upper extremities for just a few seconds. Pt seems to be unconscious for a few seconds before coming back. No postictal state, incontinence or tongue biting.  She has no known history of seizure activity.    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  Data reviewed independently and ED Course: pt was found to have WBC 9.0, negative PCR for COVID, flu and RSV, AKI with creatinine 1.55, BUN 18, GFR 40 (baseline creatinine 0.94 on 04/11/2022), temperature normal, blood pressure 153/100 --> 108/72, heart rate 99 --> 80, RR 19, oxygen saturation 93-98% on room air.  Negative C diff chest.  GI pathogen panel positive for sapovirus. Patient is placed on head of bed for position.  CT-head: 1. Right basal ganglia MCA territory infarct earlier this month is subtle by CT with no hemorrhagic  transformation or mass effect. 2. No new intracranial abnormality identified.  CT -abd/pelvis: 1. Difficult to exclude Mild Acute Colitis of the descending and sigmoid colon. Those segments appear indistinct, with mild underlying colonic diverticula. 2. But no other acute or inflammatory process identified in the abdomen or pelvis. Normal appendix. 3.  Aortic Atherosclerosis (ICD10-I70.0).    EKG: I have personally reviewed.  Sinus rhythm, QTc 497, possible LAE, left axis deviation  Review of Systems:   General: no fevers, chills, no body weight gain, has poor appetite, has fatigue HEENT: no blurry vision, hearing changes or sore throat Respiratory: no dyspnea, coughing, wheezing CV: no chest pain, no palpitations GI: has nausea, vomiting, abdominal pain, diarrhea, no constipation GU: no dysuria, burning on urination, increased urinary frequency, hematuria  Ext: no leg edema Neuro: no unilateral weakness, numbness, or tingling, no vision change or hearing loss. Has possible syncope Skin: no rash, no skin tear. MSK: No muscle spasm, no deformity, no limitation of range of movement in spin Heme: No easy bruising.  Travel history: No recent long distant travel.   Allergy:  Allergies  Allergen Reactions   Tramadol Rash    Past Medical History:  Diagnosis Date   Asthma    Bronchitis    HLD (hyperlipidemia)    Hypertension    Stroke [facility])     Past Surgical History:  Procedure Laterality Date   CHOLECYSTECTOMY     IR CT HEAD LTD  04/10/2022   IR CT HEAD LTD  04/10/2022   IR INTRAVSC STENT CERV CAROTID W/O EMB-PROT MOD SED INC ANGIO  04/10/2022  IR PERCUTANEOUS ART THROMBECTOMY/INFUSION INTRACRANIAL INC DIAG ANGIO  04/10/2022   RADIOLOGY WITH ANESTHESIA N/A 04/09/2022   Procedure: RADIOLOGY WITH ANESTHESIA;  Surgeon: Radiologist, Medication, MD;  Location: [facility];  Service: Radiology;  Laterality: N/A;   TUBAL LIGATION      Social History:  reports that she quit smoking  about 14 months ago. Her smoking use included cigars. She has never used smokeless tobacco. She reports current alcohol use. She reports current drug use. Drug: Marijuana.  Family History:  Family History  Problem Relation Age of Onset   Hyperlipidemia Mother    Hypertension Mother      Prior to Admission medications   Medication Sig Start Date End Date Taking? Authorizing Provider  albuterol (PROVENTIL HFA;VENTOLIN HFA) 108 (90 Base) MCG/ACT inhaler Inhale 2 puffs into the lungs every 6 (six) hours as needed for wheezing or shortness of breath. 07/08/16   Delman Kitten, MD  aspirin 81 MG chewable tablet Chew 1 tablet (81 mg total) by mouth daily. 04/13/22   de Yolanda Manges, Cortney E, NP  citalopram (CELEXA) 20 MG tablet Take 20 mg by mouth at bedtime. 03/17/22   [provider]  hydrochlorothiazide (HYDRODIURIL) 25 MG tablet Take 25 mg by mouth daily. 03/17/22   [provider]  NIFEdipine (ADALAT CC) 30 MG 24 hr tablet Take 30 mg by mouth daily. 03/20/22   [provider]  rosuvastatin (CRESTOR) 20 MG tablet Take 1 tablet (20 mg total) by mouth daily. 04/13/22   de Yolanda Manges, Cortney E, NP  ticagrelor (BRILINTA) 90 MG TABS tablet Take 1 tablet (90 mg total) by mouth 2 (two) times daily. 04/12/22   de Yolanda Manges, Elwin Sleight E, NP    Physical Exam: Vitals:   04/23/22 0800 04/23/22 0913 04/23/22 1202 04/23/22 1636  BP:  108/72 108/72 104/69  Pulse: 91 81 73 74  Resp: '12 16 16 16  '$ Temp:  98.6 F (37 C) 98.8 F (37.1 C) (!) 97.5 F (36.4 C)  TempSrc:    Oral  SpO2: 97% 98% 100% 100%   General: Not in acute distress.  Dry mucous membranes HEENT:       Eyes: PERRL, EOMI, no scleral icterus.       ENT: No discharge from the ears and nose, no pharynx injection, no tonsillar enlargement.        Neck: No JVD, no bruit, no mass felt. Heme: No neck lymph node enlargement. Cardiac: S1/S2, RRR, No murmurs, No gallops or rubs. Respiratory: No rales, wheezing, rhonchi or rubs. GI:  Soft, nondistended, nontender, no rebound pain, no organomegaly, BS present. GU: No hematuria Ext: No pitting leg edema bilaterally. 1+DP/PT pulse bilaterally. Musculoskeletal: No joint deformities, No joint redness or warmth, no limitation of ROM in spin. Skin: No rashes.  Neuro: Alert, oriented X3, cranial nerves II-XII grossly intact, moves all extremities normally. Psych: Patient is not psychotic, no suicidal or hemocidal ideation.  Labs on Admission: I have personally reviewed following labs and imaging studies  CBC: Recent Labs  Lab 04/23/22 0543  WBC 9.0  NEUTROABS 6.8  HGB 13.7  HCT 39.4  MCV 91.8  PLT 0000000   Basic Metabolic Panel: Recent Labs  Lab 04/23/22 0543  NA 131*  K 2.7*  CL 88*  CO2 29  GLUCOSE 164*  BUN 18  CREATININE 1.55*  CALCIUM 9.6  MG 2.0  PHOS 3.8   GFR: Estimated Creatinine Clearance: 44.7 mL/min (A) (by C-G formula based on SCr of 1.55 mg/dL (H)).  Liver Function Tests: Recent Labs  Lab 04/23/22 0543  AST 34  ALT 14  ALKPHOS 109  BILITOT 0.8  PROT 8.4*  ALBUMIN 4.5   Recent Labs  Lab 04/23/22 0543  LIPASE 29   No results for input(s): "AMMONIA" in the last 168 hours. Coagulation Profile: Recent Labs  Lab 04/23/22 0543  INR 1.0   Cardiac Enzymes: No results for input(s): "CKTOTAL", "CKMB", "CKMBINDEX", "TROPONINI" in the last 168 hours. BNP (last 3 results) No results for input(s): "PROBNP" in the last 8760 hours. HbA1C: No results for input(s): "HGBA1C" in the last 72 hours. CBG: No results for input(s): "GLUCAP" in the last 168 hours. Lipid Profile: No results for input(s): "CHOL", "HDL", "LDLCALC", "TRIG", "CHOLHDL", "LDLDIRECT" in the last 72 hours. Thyroid Function Tests: No results for input(s): "TSH", "T4TOTAL", "FREET4", "T3FREE", "THYROIDAB" in the last 72 hours. Anemia Panel: No results for input(s): "VITAMINB12", "FOLATE", "FERRITIN", "TIBC", "IRON", "RETICCTPCT" in the last 72 hours. Urine analysis:     Component Value Date/Time   COLORURINE YELLOW (A) 04/23/2022 1300   APPEARANCEUR HAZY (A) 04/23/2022 1300   APPEARANCEUR Clear 03/19/2014 0908   LABSPEC 1.020 04/23/2022 1300   LABSPEC 1.020 03/19/2014 0908   PHURINE 6.0 04/23/2022 1300   GLUCOSEU NEGATIVE 04/23/2022 1300   GLUCOSEU Negative 03/19/2014 0908   HGBUR MODERATE (A) 04/23/2022 1300   BILIRUBINUR NEGATIVE 04/23/2022 1300   BILIRUBINUR Negative 03/19/2014 0908   KETONESUR NEGATIVE 04/23/2022 1300   PROTEINUR NEGATIVE 04/23/2022 1300   NITRITE POSITIVE (A) 04/23/2022 1300   LEUKOCYTESUR NEGATIVE 04/23/2022 1300   LEUKOCYTESUR Negative 03/19/2014 0908   Sepsis Labs: '@LABRCNTIP'$ (procalcitonin:4,lacticidven:4) ) Recent Results (from the past 240 hour(s))  Resp panel by RT-PCR (RSV, Flu A&B, Covid) Anterior Nasal Swab     Status: None   Collection Time: 04/23/22  5:52 AM   Specimen: Anterior Nasal Swab  Result Value Ref Range Status   SARS Coronavirus 2 by RT PCR NEGATIVE NEGATIVE Final    Comment: (NOTE) SARS-CoV-2 target nucleic acids are NOT DETECTED.  The SARS-CoV-2 RNA is generally detectable in upper respiratory specimens during the acute phase of infection. The lowest concentration of SARS-CoV-2 viral copies this assay can detect is 138 copies/mL. A negative result does not preclude SARS-Cov-2 infection and should not be used as the sole basis for treatment or other patient management decisions. A negative result may occur with  improper specimen collection/handling, submission of specimen other than nasopharyngeal swab, presence of viral mutation(s) within the areas targeted by this assay, and inadequate number of viral copies(<138 copies/mL). A negative result must be combined with clinical observations, patient history, and epidemiological information. The expected result is Negative.  Fact Sheet for Patients:  EntrepreneurPulse.com.au  Fact Sheet for Healthcare Providers:   IncredibleEmployment.be  This test is no t yet approved or cleared by the [place] FDA and  has been authorized for detection and/or diagnosis of SARS-CoV-2 by FDA under an Emergency Use Authorization (EUA). This EUA will remain  in effect (meaning this test can be used) for the duration of the COVID-19 declaration under Section 564(b)(1) of the Act, 21 U.S.C.section 360bbb-3(b)(1), unless the authorization is terminated  or revoked sooner.       Influenza A by PCR NEGATIVE NEGATIVE Final   Influenza B by PCR NEGATIVE NEGATIVE Final    Comment: (NOTE) The Xpert Xpress SARS-CoV-2/FLU/RSV plus assay is intended as an aid in the diagnosis of influenza from Nasopharyngeal swab specimens and should not be used as a sole  basis for treatment. Nasal washings and aspirates are unacceptable for Xpert Xpress SARS-CoV-2/FLU/RSV testing.  Fact Sheet for Patients: EntrepreneurPulse.com.au  Fact Sheet for Healthcare Providers: IncredibleEmployment.be  This test is not yet approved or cleared by the [place] FDA and has been authorized for detection and/or diagnosis of SARS-CoV-2 by FDA under an Emergency Use Authorization (EUA). This EUA will remain in effect (meaning this test can be used) for the duration of the COVID-19 declaration under Section 564(b)(1) of the Act, 21 U.S.C. section 360bbb-3(b)(1), unless the authorization is terminated or revoked.     Resp Syncytial Virus by PCR NEGATIVE NEGATIVE Final    Comment: (NOTE) Fact Sheet for Patients: EntrepreneurPulse.com.au  Fact Sheet for Healthcare Providers: IncredibleEmployment.be  This test is not yet approved or cleared by the [place] FDA and has been authorized for detection and/or diagnosis of SARS-CoV-2 by FDA under an Emergency Use Authorization (EUA). This EUA will remain in effect (meaning this test can be used) for  the duration of the COVID-19 declaration under Section 564(b)(1) of the Act, 21 U.S.C. section 360bbb-3(b)(1), unless the authorization is terminated or revoked.  Performed at [facility], [place], [place] [address]   C Difficile Quick Screen w PCR reflex     Status: None   Collection Time: 04/23/22 10:30 AM   Specimen: STOOL  Result Value Ref Range Status   C Diff antigen NEGATIVE NEGATIVE Final   C Diff toxin NEGATIVE NEGATIVE Final   C Diff interpretation No C. difficile detected.  Final    Comment: Performed at [facility], [address] [place], [place] [address]  Gastrointestinal Panel by PCR , Stool     Status: Abnormal   Collection Time: 04/23/22 10:30 AM   Specimen: Stool  Result Value Ref Range Status   Campylobacter species NOT DETECTED NOT DETECTED Final   Plesimonas shigelloides NOT DETECTED NOT DETECTED Final   Salmonella species NOT DETECTED NOT DETECTED Final   Yersinia enterocolitica NOT DETECTED NOT DETECTED Final   Vibrio species NOT DETECTED NOT DETECTED Final   Vibrio cholerae NOT DETECTED NOT DETECTED Final   Enteroaggregative E coli (EAEC) NOT DETECTED NOT DETECTED Final   Enteropathogenic E coli (EPEC) NOT DETECTED NOT DETECTED Final   Enterotoxigenic E coli (ETEC) NOT DETECTED NOT DETECTED Final   Shiga like toxin producing E coli (STEC) NOT DETECTED NOT DETECTED Final   Shigella/Enteroinvasive E coli (EIEC) NOT DETECTED NOT DETECTED Final   Cryptosporidium NOT DETECTED NOT DETECTED Final   Cyclospora cayetanensis NOT DETECTED NOT DETECTED Final   Entamoeba histolytica NOT DETECTED NOT DETECTED Final   Giardia lamblia NOT DETECTED NOT DETECTED Final   Adenovirus F40/41 NOT DETECTED NOT DETECTED Final   Astrovirus NOT DETECTED NOT DETECTED Final   Norovirus GI/GII NOT DETECTED NOT DETECTED Final   Rotavirus A NOT DETECTED NOT DETECTED Final   Sapovirus (I, II, IV, and V) DETECTED (A) NOT DETECTED Final    Comment:  Performed at [facility], [address], [place], [place] [address]     Radiological Exams on Admission: CT ABDOMEN PELVIS W CONTRAST  Result Date: 04/23/2022 CLINICAL DATA:  57 year old female status post right MCA ELVO infarct and endovascular reperfusion earlier this month. Syncope, seizure like activity, abdominal pain. EXAM: CT ABDOMEN AND PELVIS WITH CONTRAST TECHNIQUE: Multidetector CT imaging of the abdomen and pelvis was performed using the standard protocol following bolus administration of intravenous contrast. RADIATION DOSE REDUCTION: This exam was performed according to  the departmental dose-optimization program which includes automated exposure control, adjustment of the mA and/or kV according to patient size and/or use of iterative reconstruction technique. CONTRAST:  82m OMNIPAQUE IOHEXOL 300 MG/ML  SOLN COMPARISON:  CT Abdomen and Pelvis 07/18/2015. FINDINGS: Lower chest: Negative. Hepatobiliary: Stable liver and bile ducts in the setting of chronic cholecystectomy. Liver enhancement remains within normal limits. Pancreas: Negative. Spleen: Negative. Adrenals/Urinary Tract: Negative. Stomach/Bowel: Mild diverticulosis of the descending and proximal sigmoid colon segments, which appear mildly indistinct (series 5, image 25 left lower quadrant). Downstream rectum and upstream transverse colon have a more normal appearance. There is some fluid in the right colon which is otherwise negative. Normal retrocecal appendix on series 2, image 60. No dilated small bowel. Stomach and duodenum appear negative. No free air, free fluid. Vascular/Lymphatic: Mild postoperative changes related to right inguinal femoral artery access site series 2 image 86. Visible right femoral arteries and other major arterial structures in the abdomen and pelvis are patent. Aortoiliac calcified atherosclerosis. Patent portal venous system. No lymphadenopathy identified. Reproductive: Within normal limits. Other:  No pelvis free fluid. Musculoskeletal: Negative. IMPRESSION: 1. Difficult to exclude Mild Acute Colitis of the descending and sigmoid colon. Those segments appear indistinct, with mild underlying colonic diverticula. 2. But no other acute or inflammatory process identified in the abdomen or pelvis. Normal appendix. 3.  Aortic Atherosclerosis (ICD10-I70.0). Electronically Signed   By: HGenevie AnnM.D.   On: 04/23/2022 07:06   CT HEAD WO CONTRAST (5MM)  Result Date: 04/23/2022 CLINICAL DATA:  57year old female with new onset headache, recurrent syncope, and seizure like activity. Status post right MCA emergent large vessel occlusion and stroke with endovascular reperfusion earlier this month. EXAM: CT HEAD WITHOUT CONTRAST TECHNIQUE: Contiguous axial images were obtained from the base of the skull through the vertex without intravenous contrast. RADIATION DOSE REDUCTION: This exam was performed according to the departmental dose-optimization program which includes automated exposure control, adjustment of the mA and/or kV according to patient size and/or use of iterative reconstruction technique. COMPARISON:  Brain MRI 04/10/2022.  Head CT 04/10/2022. FINDINGS: Brain: Subtle loss of gray-white differentiation in the affected right basal ganglia which were abnormal by MRI earlier this month. No hemorrhagic transformation or mass effect. No superimposed ventriculomegaly, evidence of mass lesion, intracranial hemorrhage or evidence of new cortically based acute infarction. 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 Vascular: Faint Calcified atherosclerosis at the skull base. No suspicious intracranial vascular hyperdensity. Skull: Stable, intact. Sinuses/Orbits: Visualized paranasal sinuses and mastoids are stable and well aerated. Other: Visualized orbits and scalp soft tissues are within normal limits. IMPRESSION: 1. Right basal ganglia MCA territory infarct earlier this month is subtle by CT with no  hemorrhagic transformation or mass effect. 2. No new intracranial abnormality identified. Electronically Signed   By: HGenevie AnnM.D.   On: 04/23/2022 06:59      Assessment/Plan Principal Problem:   Syncope Active Problems:   Gastroenteritis due to sapovirus   Hypertension   AKI (acute kidney injury) ([place]   Asthma   HLD (hyperlipidemia)   Stroke ([facility]   Depression   Assessment and Plan:  Syncope: Patient possibly had syncope secondary to dehydration due to nausea vomiting and diarrhea.  Seizure is a differential diagnosis.  CT head negative for acute intracranial issues.  -Placed on telemetry bed for observation -Check orthostatic vital sign -Frequent neurocheck -Fall precaution -IV fluid: 2 L normal saline, then 100 cc/h -EEG  Gastroenteritis due to sapovirus: CT showed possible mild  Acute Colitis of the descending and sigmoid colon.  Initially started Zosyn, GI pathogen panel showed Sapovirus virus infection.  Will discontinue Zosyn. -Supportive care -IV fluid as above -As needed Zofran  Hypertension: -IV hydralazine as needed -Hold HCTZ and nifedipine due to soft blood pressure  AKI (acute kidney injury) ([place]): likely due to dehydration -IV fluid as above -Hold HCTZ  Asthma -Bronchodilators  HLD (hyperlipidemia) -[place]  Stroke (HCC) -[place], Brilinta, aspirin  Depression -Continue home medications       DVT ppx:  SQ Lovenox  Code Status: Full code  Family Communication:  Yes, patient's mother  at bed side.     Disposition Plan:  Anticipate discharge back to previous environment  Consults called: none   Admission status and Level of care: Telemetry Medical:    for obs   Dispo: The patient is from: Home              Anticipated d/c is to: Home              Anticipated d/c date is: 1 day              Patient currently is not medically stable to d/c.    Severity of Illness:  The appropriate patient status for this patient is INPATIENT.  Inpatient status is judged to be reasonable and necessary in order to provide the required intensity of service to ensure the patient's safety. The patient's presenting symptoms, physical exam findings, and initial radiographic and laboratory data in the context of their chronic comorbidities is felt to place them at high risk for further clinical deterioration. Furthermore, it is not anticipated that the patient will be medically stable for discharge from the hospital within 2 midnights of admission.   * I certify that at the point of admission it is my clinical judgment that the patient will require inpatient hospital care spanning beyond 2 midnights from the point of admission due to high intensity of service, high risk for further deterioration and high frequency of surveillance required.*       Date of Service 04/23/2022    Ivor Costa [facility]   If 7PM-7AM, please contact night-coverage [email or web address] 04/23/2022, 6:32 PM

## 2022-04-23 NOTE — ED Notes (Signed)
Patient transported to CT 

## 2022-04-23 NOTE — ED Provider Notes (Signed)
[facility] Provider Note    Event Date/Time   First MD Initiated Contact with Patient 04/23/22 [phone]     (approximate)   History   Seizures (Possible, no hx and pt is not post ictal) and Loss of Consciousness   HPI  Patricia [name] is a 57 y.o. 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  She states over the past 2 days she has had multiple episodes of nonbloody, nonbilious vomiting and nonbloody diarrhea.  No melena.  Is complaining of generalized abdominal pain.  Mother states she has also had nasal congestion and intermittent nosebleeds.  No known fevers.  No cough or shortness of breath.  Also complaining of generalized headache.  No new numbness, tingling or weakness.  Mother states that with these episodes patient would lose consciousness and have brief shaking of her upper extremities for just a few seconds and then would only be unconscious for a few seconds before coming back to with no postictal state, incontinence or tongue biting.  She has no known history of seizure activity.  She denies having any chest pain or chest discomfort with these episodes.   History provided by patient, mother.    Past Medical History:  Diagnosis Date   Asthma    Bronchitis    Hypertension     Past Surgical History:  Procedure Laterality Date   CHOLECYSTECTOMY     IR CT HEAD LTD  04/10/2022   IR CT HEAD LTD  04/10/2022   IR INTRAVSC STENT CERV CAROTID W/O EMB-PROT MOD SED INC ANGIO  04/10/2022   IR PERCUTANEOUS ART THROMBECTOMY/INFUSION INTRACRANIAL INC DIAG ANGIO  04/10/2022   RADIOLOGY WITH ANESTHESIA N/A 04/09/2022   Procedure: RADIOLOGY WITH ANESTHESIA;   Surgeon: Radiologist, Medication, MD;  Location: [place];  Service: Radiology;  Laterality: N/A;   TUBAL LIGATION      MEDICATIONS:  Prior to Admission medications   Medication Sig Start Date End Date Taking? Authorizing Provider  albuterol (PROVENTIL HFA;VENTOLIN HFA) 108 (90 Base) MCG/ACT inhaler Inhale 2 puffs into the lungs every 6 (six) hours as needed for wheezing or shortness of breath. 07/08/16   Delman Kitten, MD  aspirin 81 MG chewable tablet Chew 1 tablet (81 mg total) by mouth daily. 04/13/22   de Yolanda Manges, Cortney E, NP  citalopram (CELEXA) 20 MG tablet Take 20 mg by mouth at bedtime. 03/17/22   [provider]  hydrochlorothiazide (HYDRODIURIL) 25 MG tablet Take 25 mg by mouth daily. 03/17/22   [provider]  NIFEdipine (ADALAT CC) 30 MG 24 hr tablet Take 30 mg by mouth daily. 03/20/22   [provider]  rosuvastatin (CRESTOR) 20 MG tablet Take 1 tablet (20 mg total) by mouth daily. 04/13/22   de Yolanda Manges, Cortney E, NP  ticagrelor (BRILINTA) 90 MG TABS tablet Take 1 tablet (90 mg total) by mouth 2 (two) times daily. 04/12/22   de Ashley Murrain, NP    Physical Exam   Triage Vital Signs: ED Triage Vitals  Enc Vitals Group     BP 04/23/22 0534 (!) 137/106     Pulse Rate 04/23/22 0534 99     Resp 04/23/22 0534 19  Temp 04/23/22 0534 97.9 F (36.6 C)     Temp src --      SpO2 04/23/22 0534 99 %     Weight --      Height --      Head Circumference --      Peak Flow --      Pain Score 04/23/22 0538 10     Pain Loc --      Pain Edu? --      Excl. in [place]? --     Most recent vital signs: Vitals:   04/23/22 0609 04/23/22 0630  BP:  (!) 153/100  Pulse: 95 80  Resp: 14 15  Temp:    SpO2: 93% 98%    CONSTITUTIONAL: Alert, responds appropriately to questions. Well-appearing; well-nourished HEAD: Normocephalic, atraumatic EYES: Conjunctivae clear, pupils appear equal, sclera nonicteric, no conjunctival pallor ENT: normal nose; moist mucous  membranes NECK: Supple, normal ROM CARD: RRR; S1 and S2 appreciated RESP: Normal chest excursion without splinting or tachypnea; breath sounds clear and equal bilaterally; no wheezes, no rhonchi, no rales, no hypoxia or respiratory distress, speaking full sentences ABD/GI: Non-distended; soft, slightly tender to palpation diffusely without guarding or rebound EXT: Normal ROM in all joints; no deformity noted, no edema, no calf tenderness or calf swelling SKIN: Normal color for age and race; warm; no rash on exposed skin NEURO: Normal speech, no facial asymmetry PSYCH: The patient's mood and manner are appropriate.   ED Results / Procedures / Treatments   LABS: (all labs ordered are listed, but only abnormal results are displayed) Labs Reviewed  COMPREHENSIVE METABOLIC PANEL - Abnormal; Notable for the following components:      Result Value   Sodium 131 (*)    Potassium 2.7 (*)    Chloride 88 (*)    Glucose, Bld 164 (*)    Creatinine, Ser 1.55 (*)    Total Protein 8.4 (*)    GFR, Estimated 40 (*)    All other components within normal limits  RESP PANEL BY RT-PCR (RSV, FLU A&B, COVID)  RVPGX2  CBC WITH DIFFERENTIAL/PLATELET  PROTIME-INR  LIPASE, BLOOD  MAGNESIUM  URINALYSIS, ROUTINE W REFLEX MICROSCOPIC  TROPONIN I (HIGH SENSITIVITY)  TROPONIN I (HIGH SENSITIVITY)     EKG:  EKG Interpretation  Date/Time:  Saturday April 23 2022 05:35:08 EST Ventricular Rate:  96 PR Interval:  147 QRS Duration: 88 QT Interval:  393 QTC Calculation: 497 R Axis:   -17 Text Interpretation: Sinus rhythm Borderline left axis deviation RSR' in V1 or V2, probably normal variant Borderline prolonged QT interval Confirmed by Pryor Curia [phone]) on 04/23/2022 5:42:09 AM         RADIOLOGY: My personal review and interpretation of imaging: CT abdomen pelvis shows colitis.  CT head unremarkable.  I have personally reviewed all radiology reports.   CT ABDOMEN PELVIS W CONTRAST  Result  Date: 04/23/2022 CLINICAL DATA:  58 year old female status post right MCA ELVO infarct and endovascular reperfusion earlier this month. Syncope, seizure like activity, abdominal pain. EXAM: CT ABDOMEN AND PELVIS WITH CONTRAST TECHNIQUE: Multidetector CT imaging of the abdomen and pelvis was performed using the standard protocol following bolus administration of intravenous contrast. RADIATION DOSE REDUCTION: This exam was performed according to the departmental dose-optimization program which includes automated exposure control, adjustment of the mA and/or kV according to patient size and/or use of iterative reconstruction technique. CONTRAST:  71m OMNIPAQUE IOHEXOL 300 MG/ML  SOLN COMPARISON:  CT Abdomen and Pelvis 07/18/2015. FINDINGS: Lower  chest: Negative. Hepatobiliary: Stable liver and bile ducts in the setting of chronic cholecystectomy. Liver enhancement remains within normal limits. Pancreas: Negative. Spleen: Negative. Adrenals/Urinary Tract: Negative. Stomach/Bowel: Mild diverticulosis of the descending and proximal sigmoid colon segments, which appear mildly indistinct (series 5, image 25 left lower quadrant). Downstream rectum and upstream transverse colon have a more normal appearance. There is some fluid in the right colon which is otherwise negative. Normal retrocecal appendix on series 2, image 60. No dilated small bowel. Stomach and duodenum appear negative. No free air, free fluid. Vascular/Lymphatic: Mild postoperative changes related to right inguinal femoral artery access site series 2 image 86. Visible right femoral arteries and other major arterial structures in the abdomen and pelvis are patent. Aortoiliac calcified atherosclerosis. Patent portal venous system. No lymphadenopathy identified. Reproductive: Within normal limits. Other: No pelvis free fluid. Musculoskeletal: Negative. IMPRESSION: 1. Difficult to exclude Mild Acute Colitis of the descending and sigmoid colon. Those segments  appear indistinct, with mild underlying colonic diverticula. 2. But no other acute or inflammatory process identified in the abdomen or pelvis. Normal appendix. 3.  Aortic Atherosclerosis (ICD10-I70.0). Electronically Signed   By: Genevie Ann M.D.   On: 04/23/2022 07:06   CT HEAD WO CONTRAST (5MM)  Result Date: 04/23/2022 CLINICAL DATA:  57 year old female with new onset headache, recurrent syncope, and seizure like activity. Status post right MCA emergent large vessel occlusion and stroke with endovascular reperfusion earlier this month. EXAM: CT HEAD WITHOUT CONTRAST TECHNIQUE: Contiguous axial images were obtained from the base of the skull through the vertex without intravenous contrast. RADIATION DOSE REDUCTION: This exam was performed according to the departmental dose-optimization program which includes automated exposure control, adjustment of the mA and/or kV according to patient size and/or use of iterative reconstruction technique. COMPARISON:  Brain MRI 04/10/2022.  Head CT 04/10/2022. FINDINGS: Brain: Subtle loss of gray-white differentiation in the affected right basal ganglia which were abnormal by MRI earlier this month. No hemorrhagic transformation or mass effect. No superimposed ventriculomegaly, evidence of mass lesion, intracranial hemorrhage or evidence of new cortically based acute infarction. 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 Vascular: Faint Calcified atherosclerosis at the skull base. No suspicious intracranial vascular hyperdensity. Skull: Stable, intact. Sinuses/Orbits: Visualized paranasal sinuses and mastoids are stable and well aerated. Other: Visualized orbits and scalp soft tissues are within normal limits. IMPRESSION: 1. Right basal ganglia MCA territory infarct earlier this month is subtle by CT with no hemorrhagic transformation or mass effect. 2. No new intracranial abnormality identified. Electronically Signed   By: Genevie Ann M.D.   On: 04/23/2022 06:59      PROCEDURES:  Critical Care performed: No     .1-3 Lead EKG Interpretation  Performed by: Nayana Lenig, Delice Bison, DO Authorized by: Galilee Pierron, Delice Bison, DO     Interpretation: normal     ECG rate:  99   ECG rate assessment: normal     Rhythm: sinus rhythm     Ectopy: none     Conduction: normal       IMPRESSION / MDM / ASSESSMENT AND PLAN / ED COURSE  I reviewed the triage vital signs and the nursing notes.                          Patient here with multiple episodes of vomiting and diarrhea over the past 2 days now with 2 syncopal episodes, less likely seizures.  The patient is on the cardiac monitor to evaluate  for evidence of arrhythmia and/or significant heart rate changes.   DIFFERENTIAL DIAGNOSIS (includes but not limited to):   Dehydration, orthostasis, electrolyte derangement, anemia, colitis, diverticulitis, appendicitis, bowel obstruction, viral URI, UTI, intracranial hemorrhage, less likely seizure or acute stroke   Patient's presentation is most consistent with acute presentation with potential threat to life or bodily function.   PLAN: Will obtain CT head, CT of the abdomen pelvis, CBC, CMP, lipase, magnesium, troponin x 2, urinalysis.  Will give IV fluids, pain and nausea medicine.  She has positive orthostatic vital signs here.  Suspect this is the cause of her syncopal events today.  Will closely monitor on cardiac monitoring and monitor for any new neurologic deficits or signs of seizure-like activity.   MEDICATIONS GIVEN IN ED: Medications  potassium chloride SA (KLOR-CON M) CR tablet 40 mEq (has no administration in time range)  potassium chloride 10 mEq in 100 mL IVPB (has no administration in time range)  piperacillin-tazobactam (ZOSYN) IVPB 3.375 g (has no administration in time range)  sodium chloride 0.9 % bolus 2,000 mL (2,000 mLs Intravenous New Bag/Given 04/23/22 0558)  ondansetron (ZOFRAN) injection 4 mg (4 mg Intravenous Given 04/23/22 0558)   fentaNYL (SUBLIMAZE) injection 50 mcg (50 mcg Intravenous Given 04/23/22 0604)  iohexol (OMNIPAQUE) 300 MG/ML solution 80 mL (80 mLs Intravenous Contrast Given 04/23/22 0642)     ED COURSE: Patient potassium 2.7 without significant EKG changes.  Will give oral and IV replacement.  Normal magnesium.  Creatinine slightly elevated at 1.55.  All of this is suggestive of dehydration from her recent GI losses and can likely the cause of her syncope.  She is getting 2 L of IV fluids currently.   CT scan reviewed and interpreted by myself and the radiologist and shows acute mild colitis.  Will give Zosyn.  Patient still feeling symptomatic.  Patient would prefer admission for treatment and continued hydration.  Will discuss with hospitalist.  CT head unremarkable.  COVID, flu, RSV negative.  CONSULTS:  Consulted and discussed patient's case with hospitalist, Dr. Blaine Hamper.  I have recommended admission and consulting physician agrees and will place admission orders.  Patient (and family if present) agree with this plan.   I reviewed all nursing notes, vitals, pertinent previous records.  All labs, EKGs, imaging ordered have been independently reviewed and interpreted by myself.    OUTSIDE RECORDS REVIEWED: Reviewed recent admission to the hospital for acute stroke.       FINAL CLINICAL IMPRESSION(S) / ED DIAGNOSES   Final diagnoses:  Orthostasis  Syncope, unspecified syncope type  Hypokalemia  AKI (acute kidney injury) ([place])  Nausea vomiting and diarrhea  Acute colitis     Rx / DC Orders   ED Discharge Orders     None        Note:  This document was prepared using Dragon voice recognition software and may include unintentional dictation errors.   Khalee Mazo, Delice Bison, DO 04/23/22 [phone]

## 2022-04-23 NOTE — Plan of Care (Signed)
Patient was admitted earlier today from the ED. VSS. Negative orthostatic VS. Patient has generalized weakness but improved during the shift. She ambulates to the bathroom with a walker and 1 assist. GI panel was positive for Sapovirus. She had 3x liquid stools during the shift. Tolerates clear liquid diet. C/o nausea once. No emesis. Denies abdominal pain. Tylenol given once for occipital headache with improvement.

## 2022-04-24 DIAGNOSIS — A0839 Other viral enteritis: Secondary | ICD-10-CM | POA: Diagnosis present

## 2022-04-24 DIAGNOSIS — E559 Vitamin D deficiency, unspecified: Secondary | ICD-10-CM | POA: Diagnosis present

## 2022-04-24 DIAGNOSIS — Z1152 Encounter for screening for COVID-19: Secondary | ICD-10-CM | POA: Diagnosis not present

## 2022-04-24 DIAGNOSIS — E860 Dehydration: Secondary | ICD-10-CM | POA: Diagnosis present

## 2022-04-24 DIAGNOSIS — J45909 Unspecified asthma, uncomplicated: Secondary | ICD-10-CM | POA: Diagnosis present

## 2022-04-24 DIAGNOSIS — Z8673 Personal history of transient ischemic attack (TIA), and cerebral infarction without residual deficits: Secondary | ICD-10-CM | POA: Diagnosis not present

## 2022-04-24 DIAGNOSIS — R55 Syncope and collapse: Secondary | ICD-10-CM | POA: Diagnosis not present

## 2022-04-24 DIAGNOSIS — Z8249 Family history of ischemic heart disease and other diseases of the circulatory system: Secondary | ICD-10-CM | POA: Diagnosis not present

## 2022-04-24 DIAGNOSIS — Z7982 Long term (current) use of aspirin: Secondary | ICD-10-CM | POA: Diagnosis not present

## 2022-04-24 DIAGNOSIS — R569 Unspecified convulsions: Secondary | ICD-10-CM | POA: Diagnosis present

## 2022-04-24 DIAGNOSIS — N179 Acute kidney failure, unspecified: Secondary | ICD-10-CM | POA: Diagnosis present

## 2022-04-24 DIAGNOSIS — E871 Hypo-osmolality and hyponatremia: Secondary | ICD-10-CM | POA: Diagnosis present

## 2022-04-24 DIAGNOSIS — Z9049 Acquired absence of other specified parts of digestive tract: Secondary | ICD-10-CM | POA: Diagnosis not present

## 2022-04-24 DIAGNOSIS — F32A Depression, unspecified: Secondary | ICD-10-CM | POA: Diagnosis present

## 2022-04-24 DIAGNOSIS — E876 Hypokalemia: Secondary | ICD-10-CM | POA: Diagnosis present

## 2022-04-24 DIAGNOSIS — Z79899 Other long term (current) drug therapy: Secondary | ICD-10-CM | POA: Diagnosis not present

## 2022-04-24 DIAGNOSIS — I10 Essential (primary) hypertension: Secondary | ICD-10-CM | POA: Diagnosis present

## 2022-04-24 DIAGNOSIS — Z9851 Tubal ligation status: Secondary | ICD-10-CM | POA: Diagnosis not present

## 2022-04-24 DIAGNOSIS — E785 Hyperlipidemia, unspecified: Secondary | ICD-10-CM | POA: Diagnosis present

## 2022-04-24 LAB — CBC
HCT: 28.6 % — ABNORMAL LOW (ref 36.0–46.0)
Hemoglobin: 9.7 g/dL — ABNORMAL LOW (ref 12.0–15.0)
MCH: 32.2 pg (ref 26.0–34.0)
MCHC: 33.9 g/dL (ref 30.0–36.0)
MCV: 95 fL (ref 80.0–100.0)
Platelets: 236 10*3/uL (ref 150–400)
RBC: 3.01 MIL/uL — ABNORMAL LOW (ref 3.87–5.11)
RDW: 12 % (ref 11.5–15.5)
WBC: 4.8 10*3/uL (ref 4.0–10.5)
nRBC: 0 % (ref 0.0–0.2)

## 2022-04-24 LAB — BASIC METABOLIC PANEL
Anion gap: 5 (ref 5–15)
BUN: 8 mg/dL (ref 6–20)
CO2: 26 mmol/L (ref 22–32)
Calcium: 8.2 mg/dL — ABNORMAL LOW (ref 8.9–10.3)
Chloride: 105 mmol/L (ref 98–111)
Creatinine, Ser: 1 mg/dL (ref 0.44–1.00)
GFR, Estimated: >60 mL/min (ref >60)
Glucose, Bld: 98 mg/dL (ref 70–99)
Potassium: 3 mmol/L — ABNORMAL LOW (ref 3.5–5.1)
Sodium: 136 mmol/L (ref 135–145)

## 2022-04-24 LAB — IRON AND TIBC
Iron: 58 ug/dL (ref 28–170)
Saturation Ratios: 25 % (ref 10.4–31.8)
TIBC: 235 ug/dL — ABNORMAL LOW (ref 250–450)
UIBC: 177 ug/dL

## 2022-04-24 LAB — VITAMIN B12: Vitamin B-12: 183 pg/mL (ref 180–914)

## 2022-04-24 LAB — MAGNESIUM: Magnesium: 1.9 mg/dL (ref 1.7–2.4)

## 2022-04-24 LAB — FOLATE: Folate: 11.9 ng/mL (ref >5.9)

## 2022-04-24 LAB — PHOSPHORUS: Phosphorus: 2.6 mg/dL (ref 2.5–4.6)

## 2022-04-24 LAB — VITAMIN D 25 HYDROXY (VIT D DEFICIENCY, FRACTURES): Vit D, 25-Hydroxy: 13.91 ng/mL — ABNORMAL LOW (ref 30–100)

## 2022-04-24 MED ORDER — POTASSIUM CHLORIDE CRYS ER 20 MEQ PO TBCR
40.0000 meq | EXTENDED_RELEASE_TABLET | Freq: Once | ORAL | Status: AC
  Administered 2022-04-24: 40 meq via ORAL
  Filled 2022-04-24: qty 2

## 2022-04-24 MED ORDER — POTASSIUM CHLORIDE 10 MEQ/100ML IV SOLN
10.0000 meq | INTRAVENOUS | Status: AC
  Administered 2022-04-24 (×4): 10 meq via INTRAVENOUS
  Filled 2022-04-24 (×3): qty 100

## 2022-04-24 NOTE — Progress Notes (Signed)
Mobility Specialist - Progress Note   04/24/22 0914  Mobility  Activity Ambulated with assistance in room  Level of Assistance Standby assist, set-up cues, supervision of patient - no hands on  Assistive Device Front wheel walker  Distance Ambulated (ft) 15 ft  Activity Response Tolerated well  Mobility Referral Yes  $Mobility charge 1 Mobility   Pt OOB in bathroom on RA upon arrival. Pt STS, performs peri care, and ambulates in room SBA with no LOB noted. Pt returns to bed with needs in reach and bed alarm set.   Gretchen Short  Mobility Specialist  04/24/22 9:16 AM

## 2022-04-24 NOTE — Progress Notes (Signed)
[facility] Progress Note  Patient: Patricia [name]    [id number]  DOA: 04/23/2022     Date of Service: the patient was seen and examined on 04/24/2022  Chief Complaint  Patient presents with   Seizures    Possible, no hx and pt is not post ictal   Loss of Consciousness   Brief hospital course: Patricia [name] is a 57 y.o. female with medical history significant of HTN, HLD, asthma, stroke, depression, tobacco abuse, who presents with nausea, vomiting, diarrhea and possible syncope.  As per patient's mother symptoms started on 2/23, multiple episodes of nonbilious nonbloody vomiting and several episodes of watery diarrhea.  No blood in the stool.  As per mother patient had 2 episodes of shaking of upper extremities just for few seconds.  Patient seems to be unconscious for few seconds.  No postictal state, denies any incontinence or tongue bite, no known history of seizures. 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  ED w/up: GI pathogen positive for [place] virus  CT-head: 1. Right basal ganglia MCA territory infarct earlier this month is subtle by CT with no hemorrhagic transformation or mass effect. 2. No new intracranial abnormality identified.   CT -abd/pelvis: 1. Difficult to exclude Mild Acute Colitis of the descending and sigmoid colon. Those segments appear indistinct, with mild underlying colonic diverticula. 2. But no other acute or inflammatory process identified in the abdomen or pelvis. Normal appendix. 3.  Aortic Atherosclerosis (ICD10-I70.0).   Assessment and Plan:  # Syncope: Patient possibly had syncope secondary to dehydration due to nausea vomiting and diarrhea.  Seizure is a differential diagnosis.  CT head negative for acute intracranial  issues. Continue telemetry  -Check orthostatic vital sign -Frequent neurocheck -Fall precaution -IV fluid: 2 L normal saline, then 100 cc/h, decraesed to 75 ml/hr -EEG   # Gastroenteritis due to sapovirus: CT showed possible mild Acute Colitis of the descending and sigmoid colon.  Initially started Zosyn, GI pathogen panel showed Sapovirus virus infection.  discontinued Zosyn. -Supportive care -IV fluid NS 75 ml/hr -As needed Zofran   # Hypokalemia secondary to diarrhea Potassium repleted. Monitor electrolytes and replete as needed.  Hypertension: -IV hydralazine as needed -Hold HCTZ and nifedipine due to soft blood pressure 2/25 blood pressure is improving, we will continue to monitor BP and titrate medication accordingly   AKI (acute kidney injury) ([place]): likely due to dehydration -IV fluid as above -Hold HCTZ   Asthma -Bronchodilators   HLD (hyperlipidemia) -[place]   Stroke (HCC) -[place], Brilinta, aspirin   Depression -Continue home medications  Marijuana use, UDS is positive for cannabinoids. abstinence counseling done.   There is no height or weight on file to calculate BMI.  Interventions:     Diet: Soft diet DVT Prophylaxis: Subcutaneous Lovenox   Advance goals of care discussion: Full code  Family Communication: family was present at bedside, at the time of interview.  The pt provided permission to discuss medical plan with the family. Opportunity was given to ask question and all questions were answered satisfactorily.   Disposition:  Pt is from Home, admitted with syncope dehydration and acute gastroenteritis, still has diarrhea, and hypokalemia, which precludes a safe discharge. Discharge to home, when clinically stable..  Subjective: No significant overnight events, patient is abdominal pain is off and on  only during bowel movements.  No pain at rest.  Denies any nausea vomiting, patient had 2 episodes of diarrhea today and more than 5 episodes  of diarrhea yesterday. Denies any chest pain or palpitations, no shortness of breath, denies any seizure activity after admission.  Physical Exam: General: NAD, lying comfortably Appear in no distress, affect appropriate Eyes: PERRLA ENT: Oral Mucosa Clear, moist  Neck: no JVD,  Cardiovascular: S1 and S2 Present, no Murmur,  Respiratory: good respiratory effort, Bilateral Air entry equal and Decreased, no Crackles, no wheezes Abdomen: Bowel Sound present, Soft and no tenderness,  Skin: no rashes Extremities: no Pedal edema, no calf tenderness Neurologic: without any new focal findings Gait not checked due to patient safety concerns  Vitals:   04/23/22 2332 04/24/22 0449 04/24/22 0803 04/24/22 1228  BP: 106/75 116/81 113/77 (!) 140/87  Pulse: 77 80 83 75  Resp: '20 20 18 18  '$ Temp: 98.1 F (36.7 C) 98.3 F (36.8 C) 98 F (36.7 C)   TempSrc:      SpO2: 100% 99% 100% 100%    Intake/Output Summary (Last 24 hours) at 04/24/2022 1338 Last data filed at 04/24/2022 1100 Gross per 24 hour  Intake 2167.74 ml  Output --  Net 2167.74 ml   There were no vitals filed for this visit.  Data Reviewed: I have personally reviewed and interpreted daily labs, tele strips, imagings as discussed above. I reviewed all nursing notes, pharmacy notes, vitals, pertinent old records I have discussed plan of care as described above with RN and patient/family.  CBC: Recent Labs  Lab 04/23/22 0543 04/24/22 0555  WBC 9.0 4.8  NEUTROABS 6.8  --   HGB 13.7 9.7*  HCT 39.4 28.6*  MCV 91.8 95.0  PLT 353 [id number]   Basic Metabolic Panel: Recent Labs  Lab 04/23/22 0543 04/24/22 0551 04/24/22 0555  NA 131*  --  136  K 2.7*  --  3.0*  CL 88*  --  105  CO2 29  --  26  GLUCOSE 164*  --  98  BUN 18  --  8  CREATININE 1.55*  --  1.00  CALCIUM 9.6  --  8.2*  MG 2.0 1.9  --   PHOS 3.8 2.6  --     Studies: No results found.  Scheduled Meds:  aspirin  81 mg Oral Daily   citalopram  20 mg Oral QHS    enoxaparin (LOVENOX) injection  40 mg Subcutaneous Q24H   nicotine  21 mg Transdermal Daily   rosuvastatin  20 mg Oral Daily   ticagrelor  90 mg Oral BID   Continuous Infusions:  sodium chloride 100 mL/hr at 04/24/22 0821   PRN Meds: acetaminophen, albuterol, dextromethorphan-guaiFENesin, hydrALAZINE, LORazepam, ondansetron (ZOFRAN) IV  Time spent: 35 minutes  Author: Val Riles. MD [facility] 04/24/2022 1:38 PM  To reach On-call, see care teams to locate the attending and reach out to them via [email or web address]. If 7PM-7AM, please contact night-coverage If you still have difficulty reaching the attending provider, please page the [facility] (Director on Call) for [facility] on amion for assistance.

## 2022-04-24 NOTE — Progress Notes (Signed)
Mobility Specialist - Progress Note   04/24/22 1524  Mobility  Activity Ambulated with assistance in hallway;Stood at bedside;Dangled on edge of bed;Ambulated with assistance to bathroom  Level of Assistance Standby assist, set-up cues, supervision of patient - no hands on  Assistive Device Front wheel walker  Distance Ambulated (ft) 250 ft  Activity Response Tolerated well  Mobility Referral Yes  $Mobility charge 1 Mobility   Author responds to room alarm. Pt completes bed mobility and STS indep. Pt ambulates to/from bathroom and into hallway SBA. Pt returns to bed with needs in reach.   Gretchen Short  Mobility Specialist  04/24/22 3:26 PM

## 2022-04-25 ENCOUNTER — Telehealth: Payer: Self-pay

## 2022-04-25 DIAGNOSIS — R55 Syncope and collapse: Secondary | ICD-10-CM | POA: Diagnosis not present

## 2022-04-25 DIAGNOSIS — R569 Unspecified convulsions: Secondary | ICD-10-CM | POA: Diagnosis not present

## 2022-04-25 LAB — BASIC METABOLIC PANEL
Anion gap: 5 (ref 5–15)
BUN: 7 mg/dL (ref 6–20)
CO2: 24 mmol/L (ref 22–32)
Calcium: 8.4 mg/dL — ABNORMAL LOW (ref 8.9–10.3)
Chloride: 109 mmol/L (ref 98–111)
Creatinine, Ser: 0.93 mg/dL (ref 0.44–1.00)
GFR, Estimated: >60 mL/min (ref >60)
Glucose, Bld: 88 mg/dL (ref 70–99)
Potassium: 3.8 mmol/L (ref 3.5–5.1)
Sodium: 138 mmol/L (ref 135–145)

## 2022-04-25 LAB — CBC
HCT: 28 % — ABNORMAL LOW (ref 36.0–46.0)
Hemoglobin: 9.6 g/dL — ABNORMAL LOW (ref 12.0–15.0)
MCH: 32.7 pg (ref 26.0–34.0)
MCHC: 34.3 g/dL (ref 30.0–36.0)
MCV: 95.2 fL (ref 80.0–100.0)
Platelets: 240 10*3/uL (ref 150–400)
RBC: 2.94 MIL/uL — ABNORMAL LOW (ref 3.87–5.11)
RDW: 11.8 % (ref 11.5–15.5)
WBC: 5.8 10*3/uL (ref 4.0–10.5)
nRBC: 0 % (ref 0.0–0.2)

## 2022-04-25 LAB — PHOSPHORUS: Phosphorus: 2.9 mg/dL (ref 2.5–4.6)

## 2022-04-25 LAB — MAGNESIUM: Magnesium: 1.6 mg/dL — ABNORMAL LOW (ref 1.7–2.4)

## 2022-04-25 MED ORDER — CYANOCOBALAMIN 1000 MCG/ML IJ SOLN
1000.0000 ug | Freq: Every day | INTRAMUSCULAR | Status: DC
  Administered 2022-04-25 – 2022-04-26 (×2): 1000 ug via INTRAMUSCULAR
  Filled 2022-04-25 (×2): qty 1

## 2022-04-25 MED ORDER — MAGNESIUM SULFATE 2 GM/50ML IV SOLN
2.0000 g | Freq: Once | INTRAVENOUS | Status: AC
  Administered 2022-04-25: 2 g via INTRAVENOUS
  Filled 2022-04-25: qty 50

## 2022-04-25 MED ORDER — LEVETIRACETAM 500 MG PO TABS: 500.0000 mg | ORAL_TABLET | Freq: Two times a day (BID) | ORAL | Status: DC

## 2022-04-25 MED ORDER — VITAMIN D (ERGOCALCIFEROL) 1.25 MG (50000 UNIT) PO CAPS
50000.0000 [IU] | ORAL_CAPSULE | ORAL | Status: DC
  Administered 2022-04-25: 50000 [IU] via ORAL
  Filled 2022-04-25: qty 1

## 2022-04-25 MED ORDER — VITAMIN B-12 1000 MCG PO TABS: 1000.0000 ug | ORAL_TABLET | Freq: Every day | ORAL | Status: DC

## 2022-04-25 MED ORDER — LEVETIRACETAM 500 MG PO TABS
500.0000 mg | ORAL_TABLET | Freq: Two times a day (BID) | ORAL | Status: DC
  Administered 2022-04-25 – 2022-04-26 (×2): 500 mg via ORAL
  Filled 2022-04-25 (×2): qty 1

## 2022-04-25 NOTE — Progress Notes (Signed)
Eeg done 

## 2022-04-25 NOTE — Procedures (Signed)
History: 57 yo F being evaluated for syncope.   Sedation: none  Technique: This EEG was acquired with electrodes placed according to the International 10-20 electrode system (including Fp1, Fp2, F3, F4, C3, C4, P3, P4, O1, O2, T3, T4, T5, T6, A1, A2, Fz, Cz, Pz). The following electrodes were missing or displaced: none.   Background: The background consists of intermixed alpha and beta activities. There is a well defined posterior dominant rhythm of 9 Hz that attenuates with eye opening. Sleep is recorded with normal appearing structures. During drowsiness there is a single sharp wave discharge which disrupts the background with phase reversal at T8. This is also seen at T8 > P8,F8 > O2.   Photic stimulation: Physiologic driving is not performed   EEG Abnormalities: 1) Right posterior quadrant sharp wave  Clinical Interpretation: This EEG is suggestive of an area of potential epileptogenicity in the right posterior quadrant. There was no seizure recorded on this study.   Roland Rack, MD [facility] [phone]  If 7pm- 7am, please page neurology on call as listed in [facility].

## 2022-04-25 NOTE — Evaluation (Signed)
Physical Therapy Evaluation Patient Details Name: Patricia [name] MRN: [id number] DOB: 10-Jan-1966 Today's Date: 04/25/2022  History of Present Illness  Pt is a 57 y/o F admitted on 04/23/22 after presenting with N&V, diarrhea & possible syncope. GI pathogen panel showed Sapovirus infection. 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 PMH: HTN, HLD, asthma, stroke, depression, tobacco abuse  Clinical Impression  Pt seen for PT evaluation with pt agreeable. Pt reports following d/c she was ambulatory with RW. Pt is eager to return to ambulating without AD. Pt requires min assist to ambulate without AD x ~35 ft in room, supervision for gait with RW. PT educates pt on compensatory pattern for stair negotiation with B rails with pt able to return demonstrate with close supervision. Discussed HHPT vs OPPT with pt & pt agreeable to OPPT.      Recommendations for follow up therapy are one component of a multi-disciplinary discharge planning process, led by the attending physician.  Recommendations may be updated based on patient status, additional functional criteria and insurance authorization.  Follow Up Recommendations Outpatient PT      Assistance Recommended at Discharge Set up Supervision/Assistance  Patient can return home with the following  Assistance with cooking/housework;Help with stairs or ramp for entrance;Assist for transportation    Equipment Recommendations None recommended by PT  Recommendations for Other Services       Functional Status Assessment Patient has had a recent decline in their functional status and demonstrates the ability to make significant improvements in function in a reasonable and predictable amount of time.     Precautions / Restrictions Precautions Precautions: Fall Restrictions Weight Bearing Restrictions: No       Mobility  Bed Mobility Overal bed mobility: Modified Independent Bed Mobility: Supine to Sit     Supine to sit: Modified independent (Device/Increase time), HOB elevated     General bed mobility comments: use of bed rails PRN    Transfers Overall transfer level: Needs assistance Equipment used: Rolling walker (2 wheels) Transfers: Sit to/from Stand Sit to Stand: Supervision           General transfer comment: cuing for hand placement during STS    Ambulation/Gait Ambulation/Gait assistance: Supervision Gait Distance (Feet): 150 Feet (150 ft) Assistive device: Rolling walker (2 wheels) Gait Pattern/deviations: Decreased step length - left, Decreased stride length, Decreased step length - right Gait velocity: decreased     General Gait Details: slow, steady. Prior to ambulating with RW pt is able to ambulate ~35 ft without AD with min assist with slow, steady gait, decreased heel strike BLE, decreased stride length, decreased step length BLE.  Stairs Stairs: Yes Stairs assistance: Supervision Stair Management: Two rails, Step to pattern Number of Stairs: 4 General stair comments: Education re: compensatory pattern with good return demo from pt. Slow, steady pace.  Wheelchair Mobility    Modified Rankin (Stroke Patients Only)       Balance Overall balance assessment: Needs assistance Sitting-balance support: No upper extremity supported, Feet supported Sitting balance-Leahy Scale: Normal     Standing balance support: During functional activity, No upper extremity supported Standing balance-Leahy Scale: Fair                               Pertinent Vitals/Pain Pain Assessment Pain Assessment: No/denies pain  Home Living Family/patient expects to be discharged to:: Private residence Living Arrangements: Parent Available Help at Discharge: Family;Available 24 hours/day Type of Home: Mobile home Home Access: Stairs to enter Entrance  Stairs-Rails: Right;Left;Can reach both Entrance Stairs-Number of Steps: 5 at front, 12 at back   Home Layout: One level Home Equipment: Conservation officer, nature (2 wheels) Additional Comments: pt reports 12 STE however chart from recent admit states 3-5    Prior Function Prior Level of Function : Independent/Modified Independent;Working/employed             Mobility Comments: ambulatory with RW, assistance for stairs following d/c from last admission ADLs Comments: indep     Hand Dominance   Dominant Hand: Right    Extremity/Trunk Assessment   Upper Extremity Assessment Upper Extremity Assessment: Defer to OT evaluation LUE Deficits / Details: Grip 3/5    Lower Extremity Assessment Lower Extremity Assessment: Generalized weakness LLE Deficits / Details: LLE not formally tested       Communication   Communication: No difficulties  Cognition Arousal/Alertness: Awake/alert Behavior During Therapy: WFL for tasks assessed/performed Overall Cognitive Status: Within Functional Limits for tasks assessed                                 General Comments: Follows commands throughout session, anxious when performing stair negotiation.        General Comments      Exercises     Assessment/Plan    PT Assessment Patient needs continued PT services  PT Problem List Decreased strength;Decreased coordination;Decreased activity tolerance;Decreased balance;Decreased mobility;Decreased knowledge of use of DME       PT Treatment Interventions DME instruction;Gait training;Stair training;Functional mobility training;Therapeutic activities;Therapeutic exercise;Balance training;Neuromuscular re-education;Patient/family education    PT Goals (Current goals can be found in the Care Plan section)  Acute Rehab PT Goals Patient Stated Goal: home, walk without device PT Goal Formulation: With patient Time For Goal Achievement: 05/09/22 Potential to Achieve Goals: Good     Frequency Min 2X/week     Co-evaluation               AM-PAC PT "6 Clicks" Mobility  Outcome Measure Help needed turning from your back to your side while in a flat bed without using bedrails?: None Help needed moving from lying on your back to sitting on the side of a flat bed without using bedrails?: None Help needed moving to and from a bed to a chair (including a wheelchair)?: A Little Help needed standing up from a chair using your arms (e.g., wheelchair or bedside chair)?: A Little Help needed to walk in hospital room?: A Little Help needed climbing 3-5 steps with a railing? : A Little 6 Click Score: 20    End of Session   Activity Tolerance: Patient tolerated treatment well Patient left: in chair;with chair alarm set;with call bell/phone within reach   PT Visit Diagnosis: Unsteadiness on feet (R26.81);Difficulty in walking, not elsewhere classified (R26.2);Muscle weakness (generalized) (M62.81)    Time: [id number] PT Time Calculation (min) (ACUTE ONLY): 17 min   Charges:   PT Evaluation $PT Eval Low Complexity: 1 Low PT Treatments $Neuromuscular Re-education: 8-22 mins        Lavone Nian, PT, DPT 04/25/22, 1:19 PM   Waunita Schooner 04/25/2022, 1:17 PM

## 2022-04-25 NOTE — Progress Notes (Signed)
Patient received from EEG via bed in stable condition.

## 2022-04-25 NOTE — Progress Notes (Signed)
Patient to EEG in stable condition via transport

## 2022-04-25 NOTE — Patient Outreach (Signed)
  [facility]roke Care Coordination Follow Up  04/25/2022 Name:  [name] MRN:  [id number] DOB:  06-03-1965  Subjective: Patricia [name] is a 57 y.o. year old female who is a primary care patient of [facility] alert was received on 04/22/22 indicating patient responded to questions: Sad, hopeless, anxious, or empty?Marland Kitchen    Upon chart review noted that patient currently inpatient.  Care Coordination Interventions:  No, not indicated   Follow up plan:  RN CM will notify [facility] CMA to deactivate EMMI-Stroke post discharge calls.     Encounter Outcome:  No Answer    Enzo Montgomery, RN,BSN,CCM [facility] Care Management Care Management Community Coordinator Direct Phone: [phone] Toll Free: [phone] Fax: [phone]

## 2022-04-25 NOTE — Evaluation (Signed)
Occupational Therapy Evaluation Patient Details Name: Patricia [name] MRN: [id number] DOB: 09-06-1965 Today's Date: 04/25/2022   History of Present Illness Patricia [name] is a 57 y.o. female with medical history significant of HTN, HLD, asthma, stroke, depression, tobacco abuse. 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 Pt presents with nausea, vomiting, diarrhea and possible syncope.  As per patient's mother symptoms started on 2/23, multiple episodes of nonbilious nonbloody vomiting and several episodes of watery diarrhea. GI pathogen positive for Sabo virus   Clinical Impression   Patricia [name] was seen for OT evaluation this date. Prior to recent hospital admission, pt was IND, since last admission pt was MOD I using RW. Pt lives with family. Pt presents to acute OT demonstrating impaired ADL performance and functional mobility 2/2 decreased activity tolerance and impaired functional use of non-dominant LUEs. Pt currently requires SBA + RW toilet t/f and tooth brushingstanding sinfk side. MOD I don B socks long sitting in bed. Pt would benefit from skilled OT to address noted impairments and functional limitations (see below for any additional details). Upon hospital discharge, recommend HHOT to maximize pt safety and return to PLOF.    Recommendations for follow up therapy are one component of a multi-disciplinary discharge planning process, led by the attending physician.  Recommendations may be updated based on patient status, additional functional criteria and insurance authorization.   Follow Up Recommendations  Home health OT     Assistance Recommended at Discharge Intermittent Supervision/Assistance  Patient can return home with the following A little help with bathing/dressing/bathroom;Help with stairs or ramp for entrance;Assistance with  cooking/housework    Functional Status Assessment  Patient has had a recent decline in their functional status and demonstrates the ability to make significant improvements in function in a reasonable and predictable amount of time.  Equipment Recommendations  None recommended by OT    Recommendations for Other Services       Precautions / Restrictions Precautions Precautions: Fall Restrictions Weight Bearing Restrictions: No      Mobility Bed Mobility Overal bed mobility: Modified Independent                  Transfers Overall transfer level: Needs assistance Equipment used: Rolling walker (2 wheels) Transfers: Sit to/from Stand Sit to Stand: Supervision                  Balance Overall balance assessment: Needs assistance Sitting-balance support: No upper extremity supported, Feet supported Sitting balance-Leahy Scale: Normal     Standing balance support: No upper extremity supported, During functional activity Standing balance-Leahy Scale: Good                             ADL either performed or assessed with clinical judgement   ADL Overall ADL's : Needs assistance/impaired                                       General ADL Comments: SBA + RW toilet t/f and tooth brushingstanding sinfk side. MOD I don B socks long sitting in bed.      Pertinent Vitals/Pain Pain Assessment Pain Assessment: No/denies pain     Hand Dominance Right   Extremity/Trunk Assessment Upper Extremity Assessment Upper Extremity Assessment:  LUE deficits/detail LUE Deficits / Details: Grip 3/5   Lower Extremity Assessment Lower Extremity Assessment: Overall WFL for tasks assessed       Communication Communication Communication: No difficulties   Cognition Arousal/Alertness: Awake/alert Behavior During Therapy: WFL for tasks assessed/performed Overall Cognitive Status: Within Functional Limits for tasks assessed                                        General Comments       Exercises Exercises: Other exercises Other Exercises Other Exercises: Red theraputty provided and HEP reviewed   Shoulder Instructions      Home Living Family/patient expects to be discharged to:: Private residence Living Arrangements: Parent Available Help at Discharge: Family;Available 24 hours/day Type of Home: House Home Access: Stairs to enter [employer] of Steps: 3-5 Entrance Stairs-Rails: Can reach both Home Layout: One level     Bathroom Shower/Tub: Occupational psychologist: Standard     Home Equipment: Shower seat;Hand held shower head   Additional Comments: pt reports 12 STE however chart from recent admit states 3-5      Prior Functioning/Environment Prior Level of Function : Independent/Modified Independent;Working/employed             Mobility Comments: indep, nurses aide, driving ADLs Comments: indep        OT Problem List: Decreased strength;Decreased activity tolerance;Impaired balance (sitting and/or standing);Decreased cognition;Decreased safety awareness;Decreased knowledge of use of DME or AE;Decreased knowledge of precautions;Cardiopulmonary status limiting activity;Decreased coordination;Impaired sensation;Impaired UE functional use      OT Treatment/Interventions: Self-care/ADL training;Neuromuscular education;DME and/or AE instruction;Therapeutic activities;Cognitive remediation/compensation;Patient/family education;Balance training    OT Goals(Current goals can be found in the care plan section) Acute Rehab OT Goals Patient Stated Goal: to go home safely OT Goal Formulation: With patient Time For Goal Achievement: 05/09/22 Potential to Achieve Goals: Good ADL Goals Pt Will Perform Lower Body Dressing: Independently;sit to/from stand Pt Will Transfer to Toilet: Independently;ambulating;regular height toilet Pt/caregiver will Perform Home Exercise Program:  Increased ROM;Increased strength;Left upper extremity;With theraputty;With theraband  OT Frequency: Min 2X/week    Co-evaluation              AM-PAC OT "6 Clicks" Daily Activity     Outcome Measure Help from another person eating meals?: A Little Help from another person taking care of personal grooming?: A Little Help from another person toileting, which includes using toliet, bedpan, or urinal?: A Little Help from another person bathing (including washing, rinsing, drying)?: A Little Help from another person to put on and taking off regular upper body clothing?: A Little Help from another person to put on and taking off regular lower body clothing?: None 6 Click Score: 19   End of Session    Activity Tolerance: Patient tolerated treatment well Patient left: in chair;with call bell/phone within reach  OT Visit Diagnosis: Other abnormalities of gait and mobility (R26.89);Muscle weakness (generalized) (M62.81)                Time: [id number] OT Time Calculation (min): 22 min Charges:  OT General Charges $OT Visit: 1 Visit OT Evaluation $OT Eval Low Complexity: 1 Low OT Treatments $Self Care/Home Management : 8-22 mins  Dessie Coma, M.S. OTR/L  04/25/22, 9:55 AM  ascom [phone]

## 2022-04-25 NOTE — Progress Notes (Signed)
[facility] Progress Note  Patient: Patricia [name]    [id number]  DOA: 04/23/2022     Date of Service: the patient was seen and examined on 04/25/2022  Chief Complaint  Patient presents with   Seizures    Possible, no hx and pt is not post ictal   Loss of Consciousness   Brief hospital course: Patricia [name] is a 57 y.o. female with medical history significant of HTN, HLD, asthma, stroke, depression, tobacco abuse, who presents with nausea, vomiting, diarrhea and possible syncope.  As per patient's mother symptoms started on 2/23, multiple episodes of nonbilious nonbloody vomiting and several episodes of watery diarrhea.  No blood in the stool.  As per mother patient had 2 episodes of shaking of upper extremities just for few seconds.  Patient seems to be unconscious for few seconds.  No postictal state, denies any incontinence or tongue bite, no known history of seizures. 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  ED w/up: GI pathogen positive for Sabo virus  CT-head: 1. Right basal ganglia MCA territory infarct earlier this month is subtle by CT with no hemorrhagic transformation or mass effect. 2. No new intracranial abnormality identified.   CT -abd/pelvis: 1. Difficult to exclude Mild Acute Colitis of the descending and sigmoid colon. Those segments appear indistinct, with mild underlying colonic diverticula. 2. But no other acute or inflammatory process identified in the abdomen or pelvis. Normal appendix. 3.  Aortic Atherosclerosis (ICD10-I70.0).   Assessment and Plan:  # Syncope: Patient possibly had syncope secondary to dehydration due to nausea vomiting and diarrhea.  Seizure is a differential diagnosis.  CT head negative for acute intracranial  issues. Continue telemetry  -Check orthostatic vital sign -Frequent neurocheck -Fall precaution -IV fluid: 2 L normal saline, then 100 cc/h, decraesed to 75 ml/hr -EEG still pending, no seizure activity during hospital stay   # Gastroenteritis due to sapovirus: CT showed possible mild Acute Colitis of the descending and sigmoid colon.  Initially started Zosyn, GI pathogen panel showed Sapovirus virus infection.  discontinued Zosyn. -Supportive care -s/p IV fluid NS 75 ml/hr, DC'd IV fluid today -As needed Zofran   # Hypokalemia secondary to diarrhea Potassium repleted.  Resolved # Hypomagnesemia, mag repleted IV. Monitor electrolytes and replete as needed.  Hypertension: -IV hydralazine as needed -Hold HCTZ and nifedipine due to soft blood pressure 2/25 blood pressure is improving, we will continue to monitor BP and titrate medication accordingly   AKI (acute kidney injury) ([place]): likely due to dehydration -IV fluid as above -Hold HCTZ   Asthma -Bronchodilators   HLD (hyperlipidemia) -[place]   Stroke (HCC) -[place], Brilinta, aspirin   Depression -Continue home medications  Marijuana use, UDS is positive for cannabinoids. abstinence counseling done.  Vitamin D deficiency: started vitamin D 50,000 units p.o. weekly, follow with PCP to repeat vitamin D level after 3 to 6 months.  Vitamin B12 level 183 at lower end, goal >400, started vitamin B12 1000 mcg IM injection during hospital stay followed by oral supplement on discharge. Follow with PCP to repeat vitamin B12 level after 3 to 6 months.   There is no height or weight on file to calculate BMI.  Interventions:     Diet: Soft diet DVT Prophylaxis: Subcutaneous Lovenox   Advance goals of care discussion: Full  code  Family Communication: family was present at bedside, at the time of interview.  The pt provided permission to discuss medical plan with the family. Opportunity was given to ask question and all  questions were answered satisfactorily.   Disposition:  Pt is from Home, admitted with syncope dehydration and acute gastroenteritis, still has diarrhea, and hypokalemia, which precludes a safe discharge. Discharge to home, when clinically stable..  Subjective: No significant overnight events, patient had several episodes of diarrhea yesterday, today she feels improved, no diarrhea today had a solid BM.  Denies any nausea vomiting, denies any abdominal pain. We will continue to monitor diet tolerance today and plan for disposition tomorrow a.m.  Physical Exam: General: NAD, sitting comfortably on the recliner, eating breakfast Appear in no distress, affect appropriate Eyes: PERRLA ENT: Oral Mucosa Clear, moist  Neck: no JVD,  Cardiovascular: S1 and S2 Present, no Murmur,  Respiratory: good respiratory effort, Bilateral Air entry equal and Decreased, no Crackles, no wheezes Abdomen: Bowel Sound present, Soft and no tenderness,  Skin: no rashes Extremities: no Pedal edema, no calf tenderness Neurologic: without any new focal findings Gait not checked due to patient safety concerns  Vitals:   04/24/22 1228 04/24/22 2034 04/25/22 0605 04/25/22 0828  BP: (!) 140/87 (!) 127/91 113/75 (!) 138/91  Pulse: 75 84 75 75  Resp: '18 19 20 18  '$ Temp:  98.8 F (37.1 C) 98.7 F (37.1 C) 98 F (36.7 C)  TempSrc:  Oral Oral Oral  SpO2: 100% 100% 100% 98%    Intake/Output Summary (Last 24 hours) at 04/25/2022 1252 Last data filed at 04/25/2022 0457 Gross per 24 hour  Intake 1828.91 ml  Output --  Net 1828.91 ml   There were no vitals filed for this visit.  Data Reviewed: I have personally reviewed and interpreted daily labs, tele strips, imagings as discussed above. I reviewed all nursing notes, pharmacy notes, vitals, pertinent old records I have discussed plan of care as described above with RN and patient/family.  CBC: Recent Labs  Lab 04/23/22 0543 04/24/22 0555 04/25/22 0432  WBC  9.0 4.8 5.8  NEUTROABS 6.8  --   --   HGB 13.7 9.7* 9.6*  HCT 39.4 28.6* 28.0*  MCV 91.8 95.0 95.2  PLT 353 236 [id number]   Basic Metabolic Panel: Recent Labs  Lab 04/23/22 0543 04/24/22 0551 04/24/22 0555 04/25/22 0432  NA 131*  --  136 138  K 2.7*  --  3.0* 3.8  CL 88*  --  105 109  CO2 29  --  26 24  GLUCOSE 164*  --  98 88  BUN 18  --  8 7  CREATININE 1.55*  --  1.00 0.93  CALCIUM 9.6  --  8.2* 8.4*  MG 2.0 1.9  --  1.6*  PHOS 3.8 2.6  --  2.9    Studies: No results found.  Scheduled Meds:  aspirin  81 mg Oral Daily   citalopram  20 mg Oral QHS   cyanocobalamin  1,000 mcg Intramuscular Daily   Followed by   Derrill Memo ON 05/02/2022] vitamin B-12  1,000 mcg Oral Daily   enoxaparin (LOVENOX) injection  40 mg Subcutaneous Q24H   nicotine  21 mg Transdermal Daily   rosuvastatin  20 mg Oral Daily   ticagrelor  90 mg Oral BID   Vitamin D (Ergocalciferol)  50,000 Units Oral Q7 days   Continuous Infusions:  sodium chloride 75 mL/hr at 04/25/22 0457   PRN Meds: acetaminophen,  albuterol, dextromethorphan-guaiFENesin, hydrALAZINE, LORazepam, ondansetron (ZOFRAN) IV  Time spent: 35 minutes  Author: Val Riles. MD [facility] 04/25/2022 12:52 PM  To reach On-call, see care teams to locate the attending and reach out to them via [email or web address]. If 7PM-7AM, please contact night-coverage If you still have difficulty reaching the attending provider, please page the [facility] (Director on Call) for [facility] on amion for assistance.

## 2022-04-26 DIAGNOSIS — R55 Syncope and collapse: Secondary | ICD-10-CM | POA: Diagnosis not present

## 2022-04-26 DIAGNOSIS — R569 Unspecified convulsions: Secondary | ICD-10-CM | POA: Diagnosis not present

## 2022-04-26 LAB — BASIC METABOLIC PANEL
Anion gap: 5 (ref 5–15)
BUN: 7 mg/dL (ref 6–20)
CO2: 26 mmol/L (ref 22–32)
Calcium: 8.7 mg/dL — ABNORMAL LOW (ref 8.9–10.3)
Chloride: 107 mmol/L (ref 98–111)
Creatinine, Ser: 0.81 mg/dL (ref 0.44–1.00)
GFR, Estimated: >60 mL/min (ref >60)
Glucose, Bld: 90 mg/dL (ref 70–99)
Potassium: 3.5 mmol/L (ref 3.5–5.1)
Sodium: 138 mmol/L (ref 135–145)

## 2022-04-26 LAB — CBC
HCT: 29.4 % — ABNORMAL LOW (ref 36.0–46.0)
Hemoglobin: 10 g/dL — ABNORMAL LOW (ref 12.0–15.0)
MCH: 32.2 pg (ref 26.0–34.0)
MCHC: 34 g/dL (ref 30.0–36.0)
MCV: 94.5 fL (ref 80.0–100.0)
Platelets: 264 10*3/uL (ref 150–400)
RBC: 3.11 MIL/uL — ABNORMAL LOW (ref 3.87–5.11)
RDW: 11.8 % (ref 11.5–15.5)
WBC: 7.1 10*3/uL (ref 4.0–10.5)
nRBC: 0 % (ref 0.0–0.2)

## 2022-04-26 LAB — MAGNESIUM: Magnesium: 1.8 mg/dL (ref 1.7–2.4)

## 2022-04-26 LAB — PHOSPHORUS: Phosphorus: 3.4 mg/dL (ref 2.5–4.6)

## 2022-04-26 MED ORDER — CYANOCOBALAMIN 1000 MCG PO TABS: 1000.0000 ug | ORAL_TABLET | Freq: Every day | ORAL | 0 refills | Status: AC

## 2022-04-26 MED ORDER — LEVETIRACETAM 500 MG PO TABS: 500.0000 mg | ORAL_TABLET | Freq: Two times a day (BID) | ORAL | 1 refills | Status: DC

## 2022-04-26 MED ORDER — VITAMIN D (ERGOCALCIFEROL) 1.25 MG (50000 UNIT) PO CAPS: 50000.0000 [IU] | ORAL_CAPSULE | ORAL | 0 refills | Status: AC

## 2022-04-26 NOTE — Progress Notes (Signed)
     [facility] REFERRAL        Occupational Therapy * Physical Therapy * Speech Therapy                           DATE 04/26/2022  PATIENT NAME  Patricia [name]  PATIENT MRN [id number]        DIAGNOSIS/DIAGNOSIS CODE  R55  DATE OF DISCHARGE: 04/26/2022       PRIMARY CARE PHYSICIAN      PCP PHONE/FAX [facility]     Dear Provider (Name: [facility] outpatient __  Fax: [phone]   I certify that I have examined this patient and that occupational/physical/speech therapy is necessary on an outpatient basis.    The patient has expressed interest in completing their recommended course of therapy at your  location.  Once a formal order from the patient's primary care physician has been obtained, please  contact him/her to schedule an appointment for evaluation at your earliest convenience.   [x ]  Physical Therapy Evaluate and Treat  [ x ]  Occupational Therapy Evaluate and Treat  [  ]  Speech Therapy Evaluate and Treat         The patient's primary care physician (listed above) must furnish and be responsible for a formal order such that the recommended services may be furnished while under the primary physician's care, and that the plan of care will be established and reviewed every 30 days (or more often if condition necessitates).

## 2022-04-26 NOTE — Consult Note (Signed)
Neurology Consultation Reason for Consult: Concern for seizure Referring Physician: Dwyane Dee, D  CC: Loss of consciousness with shaking  History is obtained from: Patient, mother  HPI: Patricia [name] is a 57 y.o. female with a history of recent large vessel occlusion status post mechanical thrombectomy and tenecteplase on February 10.  Following discharge from the hospital, she has been home and doing well but then developed a gastrointestinal illness.  She was sitting on the toilet and subsequently became flushed, was sweating and lightheaded.  She then had loss of consciousness with stiffening and shaking that lasted for approximately 15 seconds.  No definite postictal period.  She had two episodes, both were in the seated position.  She denies any tongue biting or incontinence.  Due to these episodes an EEG was obtained which does raise concern for possible right-sided seizure focus and therefore neurology has been consulted.   Past Medical History:  Diagnosis Date   Asthma    Bronchitis    HLD (hyperlipidemia)    Hypertension    Stroke [facility])      Family History  Problem Relation Age of Onset   Hyperlipidemia Mother    Hypertension Mother      Social History:  reports that she quit smoking about 14 months ago. Her smoking use included cigars. She has never used smokeless tobacco. She reports current alcohol use. She reports current drug use. Drug: Marijuana.   Exam: Current vital signs: BP 112/80 (BP Location: Right Arm)   Pulse 69   Temp 98 F (36.7 C)   Resp 16   SpO2 100%  Vital signs in last 24 hours: Temp:  [97.8 F (36.6 C)-98.1 F (36.7 C)] 98 F (36.7 C) (02/27 0827) Pulse Rate:  [66-74] 69 (02/27 0827) Resp:  [16-18] 16 (02/27 0827) BP: (112-136)/(73-93) 112/80 (02/27 0827) SpO2:  [99 %-100 %] 100 % (02/27 0827)   Physical Exam  Appears well-developed and well-nourished.   Neuro: Mental Status: Patient is awake, alert, oriented to person, place,  month, year, and situation. Patient is able to give a clear and coherent history. No signs of aphasia or neglect Cranial Nerves: II: Visual Fields are full. Pupils are equal, round, and reactive to light.   III,IV, VI: EOMI without ptosis or diploplia.  V: Facial sensation is symmetric to temperature VII: Facial movement is symmetric.  VIII: hearing is intact to voice X: Uvula elevates symmetrically XI: Shoulder shrug is symmetric. XII: tongue is midline without atrophy or fasciculations.  Motor: Tone is normal. Bulk is normal. 5/5 strength was present on the right side, she has 4/5 strength in the left arm and 4+/5 strength in the left leg Sensory: Sensation is mildly diminished on the left Cerebellar: No ataxia     I have reviewed labs in epic and the results pertinent to this consultation are: Creatinine 0.81  I have reviewed the images obtained: CT head-no acute findings  Impression: 57 year old female with recent cortical and subcortical infarct with two episodes of stiffening/shaking concerning for convulsive syncope versus seizure.  The description actually sounds more consistent with convulsive syncope, however the presence of a sharp wave on her EEG does raise possibility of seizure predisposition unmasked by her GI infection.  Given this, I think that though stroke seizures are a possibility and would favor antiepileptic therapy.  Recommendations: 1) Keppra 500 mg twice daily 2) no driving for 6 months by state law, I discussed this with the patient who expressed understanding. 3) follow-up as previously with  outpatient neurology.   Roland Rack, MD [facility] [phone]  If 7pm- 7am, please page neurology on call as listed in [place].

## 2022-04-26 NOTE — Progress Notes (Signed)
Patient told us the estimated arrival time at 1 pm,but repeatedly backs up time then stated ride arrive after children picked from the school now pt stated  rider will be in 45 minutes. Secretary attempted to call her son but no response, patient offered taxi cab voucher and paper scrubs, and she refused both.

## 2022-04-26 NOTE — Progress Notes (Signed)
Occupational Therapy Treatment Patient Details Name: Patricia [name] MRN: [id number] DOB: Mar 06, 1965 Today's Date: 04/26/2022   History of present illness Pt is a 57 y/o F admitted on 04/23/22 after presenting with N&V, diarrhea & possible syncope. GI pathogen panel showed Sapovirus infection. 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 PMH: HTN, HLD, asthma, stroke, depression, tobacco abuse   OT comments  Patricia [name] demonstrated good progress this day. She denies pain, nausea, or vomiting. She was able to perform bed mobility with Mod I, transfers with Mod I-SUPV, improved standing balance w/ RW. Provided educ re: stress mgmt, smoking cessation, stroke education. Pt receives breakfast tray, therapist encourages pt to use her L UE as much as possible while eating. Pt is able to open containers, hold cup and utensils, bring food to mouth all with L UE and without spillage. Reports that L UE "feels weird," but sensation WNL, grip strength still reduced. Pt reports she prefers outpt therapy to [facility], have changed recommendations accordingly. Pt also states that the RW she has been using the last several weeks (one that had been available in her home, left over from someone else's injury) doesn't fit her well -- sounds as if it is a bariatric walker. Therefore, recommend ordering a new, properly sized RW.   Recommendations for follow up therapy are one component of a multi-disciplinary discharge planning process, led by the attending physician.  Recommendations may be updated based on patient status, additional functional criteria and insurance authorization.    Follow Up Recommendations  Outpatient OT     Assistance Recommended at Discharge Intermittent Supervision/Assistance  Patient can return home with the following  A little help with bathing/dressing/bathroom;Help  with stairs or ramp for entrance;Assistance with cooking/housework   Equipment Recommendations  Other (comment) (RW)    Recommendations for Other Services      Precautions / Restrictions Precautions Precautions: Fall Precaution Comments: seizure precautions Restrictions Weight Bearing Restrictions: No       Mobility Bed Mobility Overal bed mobility: Modified Independent Bed Mobility: Supine to Sit     Supine to sit: Modified independent (Device/Increase time), HOB elevated     General bed mobility comments: use of bed rails PRN    Transfers Overall transfer level: Needs assistance Equipment used: Rolling walker (2 wheels) Transfers: Sit to/from Stand, Bed to chair/wheelchair/BSC Sit to Stand: Supervision     Step pivot transfers: Supervision           Balance Overall balance assessment: Needs assistance Sitting-balance support: No upper extremity supported, Feet supported Sitting balance-Leahy Scale: Normal     Standing balance support: Bilateral upper extremity supported, During functional activity Standing balance-Leahy Scale: Good                             ADL either performed or assessed with clinical judgement   ADL Overall ADL's : Needs assistance/impaired Eating/Feeding: Supervision/ safety Eating/Feeding Details (indicate cue type and reason): using L UE as much as possible Grooming: Wash/dry hands;Sitting;Modified independent                                      Extremity/Trunk Assessment Upper Extremity Assessment Upper Extremity Assessment: LUE deficits/detail LUE Deficits / Details: Grip 3/5  Lower Extremity Assessment Lower Extremity Assessment: LLE deficits/detail LLE Deficits / Details: mild LLE weakness, not formally tested   Cervical / Trunk Assessment Cervical / Trunk Assessment: Normal    Vision       Perception     Praxis      Cognition Arousal/Alertness: Awake/alert Behavior During  Therapy: WFL for tasks assessed/performed Overall Cognitive Status: Within Functional Limits for tasks assessed                                          Exercises Other Exercises Other Exercises: Educ re: signs/symptoms of stroke and importance of immediate medical attn; smoking cessation; stress management; HEP for L UE    Shoulder Instructions       General Comments      Pertinent Vitals/ Pain       Pain Assessment Pain Assessment: No/denies pain  Home Living                                          Prior Functioning/Environment              Frequency  Min 2X/week        Progress Toward Goals  OT Goals(current goals can now be found in the care plan section)  Progress towards OT goals: Progressing toward goals  Acute Rehab OT Goals OT Goal Formulation: With patient Time For Goal Achievement: 05/09/22 Potential to Achieve Goals: Good  Plan Discharge plan needs to be updated;Frequency remains appropriate    Co-evaluation                 AM-PAC OT "6 Clicks" Daily Activity     Outcome Measure   Help from another person eating meals?: None Help from another person taking care of personal grooming?: A Little Help from another person toileting, which includes using toliet, bedpan, or urinal?: A Little Help from another person bathing (including washing, rinsing, drying)?: A Little Help from another person to put on and taking off regular upper body clothing?: A Little Help from another person to put on and taking off regular lower body clothing?: A Little 6 Click Score: 19    End of Session Equipment Utilized During Treatment: Rolling walker (2 wheels)  OT Visit Diagnosis: Other abnormalities of gait and mobility (R26.89);Muscle weakness (generalized) (M62.81)   Activity Tolerance Patient tolerated treatment well   Patient Left in chair;with call bell/phone within reach   Nurse Communication          Time:  [id number] OT Time Calculation (min): 18 min  Charges: OT General Charges $OT Visit: 1 Visit OT Treatments $Self Care/Home Management : 8-22 mins  Josiah Lobo, PhD, MS, OTR/L 04/26/22, 9:57 AM

## 2022-04-26 NOTE — Discharge Summary (Signed)
[facility] Discharge Summary   Patient: Patricia [name] [id number]  PCP: [facility]  Date of admission: 04/23/2022   Date of discharge: 04/26/2022 04/27/2022     Discharge Diagnoses:  Principal Problem:   Syncope Active Problems:   Gastroenteritis due to sapovirus   Hypertension   AKI (acute kidney injury) ([place])   Asthma   HLD (hyperlipidemia)   Stroke ([place])   Depression   Admitted From: Home Disposition:  Home with outpatient PT  Recommendations for Outpatient Follow-up:  F/u PCP in  1wk F/u Neuro in 2-3 wks Follow up LABS/TEST:     Diet recommendation: Cardiac diet  Activity: The patient is advised to gradually reintroduce usual activities, as tolerated  Discharge Condition: stable  Code Status: Full code   History of present illness: As per the H and P dictated on admission Hospital Course:  Patricia [name] is a 57 y.o. female with medical history significant of HTN, HLD, asthma, stroke, depression, tobacco abuse, who presents with nausea, vomiting, diarrhea and possible syncope.  As per patient's mother symptoms started on 2/23, multiple episodes of nonbilious nonbloody vomiting and several episodes of watery diarrhea.  No blood in the stool.  As per mother patient had 2 episodes of shaking of upper extremities just for few seconds.  Patient seems to be unconscious for few seconds.  No postictal state, denies any incontinence or tongue bite, no known history of seizures. 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  ED w/up: GI pathogen positive for Sabo virus CT-head: 1. Right basal ganglia MCA territory infarct earlier this month is subtle by CT with no hemorrhagic transformation or mass effect. 2. No new intracranial abnormality  identified. CT -abd/pelvis: 1. Difficult to exclude Mild Acute Colitis of the descending and sigmoid colon. Those segments appear indistinct, with mild underlying colonic diverticula. 2. But no other acute or inflammatory process identified in the abdomen or pelvis. Normal appendix. 3.  Aortic Atherosclerosis (ICD10-I70.0).   Assessment and Plan:   # Syncope: Possible due to seizures versus dehydration secondary to  nausea vomiting and diarrhea.  Seizure is a differential diagnosis.  CT head negative for acute intracranial issues.  Monitor on telemetry, no significant events noticed.  No new neurological changes during hospital stay. S/p IV fluid: 2 L normal saline, then 100 cc/h, decraesed to 75 ml/hr.  EEG does raise possibility of seizure given underlying history of stroke.   Neurology was consulted, recommended Keppra 500 mg p.o. twice daily.  No driving for 6 months by state law, it was discussed with the patient and she verbalized understanding.  Patient was recommended to follow with neurology as an outpatient. # Gastroenteritis due to sapovirus: CT showed possible mild Acute Colitis of the descending and sigmoid colon.  Initially started Zosyn, GI pathogen panel showed Sapovirus virus infection.  discontinued Zosyn. s/p IV fluid NS 75 ml/hr, DC'd IV fluid as patient was tolerating oral diet.  # Hypokalemia secondary to diarrhea. Potassium repleted.  Resolved # Hypomagnesemia, mag repleted IV. Resolved  # Hypertension: Patient was on hydrochlorothiazide and EpiPen at home.  BP was soft so medications were held.  Resumed BP medications on discharge, patient was advised to monitor BP at home and follow with PCP for further management. # AKI (acute kidney injury) ([place]): likely due  to dehydration.  IV fluid was given, AKI resolved.  Resumed home medications. # Asthma, continue Bronchodilators # HLD (hyperlipidemia) on [place] # Stroke ([place]) on [place], Brilinta, aspirin # Depression, Continue home  medications # Marijuana use, UDS is positive for cannabinoids. abstinence counseling done. # Vitamin D deficiency: started vitamin D 50,000 units p.o. weekly, follow with PCP to repeat vitamin D level after 3 to 6 months. # Vitamin B12 level 183 at lower end, goal >400, started vitamin B12 1000 mcg IM injection during hospital stay followed by oral supplement on discharge. Follow with PCP to repeat vitamin B12 level after 3 to 6 months.  There is no height or weight on file to calculate BMI.   Nutrition Interventions:   - Patient was instructed, not to drive, operate heavy machinery, perform activities at heights, swimming or participation in water activities or provide baby sitting services while on Pain, Sleep and Anxiety Medications; until her outpatient Physician has advised to do so again.  - Also recommended to not to take more than prescribed Pain, Sleep and Anxiety Medications.  Patient was seen by physical therapy, who recommended outpatient PT, which was arranged. On the day of the discharge the patient's vitals were stable, and no other acute medical condition were reported by patient. the patient was felt safe to be discharge at Home with outpatient PT.  Consultants: neurology Procedures: EEG  Discharge Exam: General: Appear in no distress, no Rash; Oral Mucosa Clear, moist. Cardiovascular: S1 and S2 Present, no Murmur, Respiratory: normal respiratory effort, Bilateral Air entry present and no Crackles, no wheezes Abdomen: Bowel Sound present, Soft and no tenderness, no hernia Extremities: no Pedal edema, no calf tenderness Neurology: alert and oriented to time, place, and person affect appropriate.  There were no vitals filed for this visit. Vitals:   04/26/22 0827 04/26/22 1651  BP: 112/80 (!) 142/88  Pulse: 69 75  Resp: 16 16  Temp: 98 F (36.7 C) 98.5 F (36.9 C)  SpO2: 100% 100%    DISCHARGE MEDICATION: Allergies as of 04/26/2022       Reactions   Tramadol  Rash        Medication List     STOP taking these medications    Plavix 75 MG tablet Generic drug: clopidogrel       TAKE these medications    albuterol 108 (90 Base) MCG/ACT inhaler Commonly known as: VENTOLIN HFA Inhale 2 puffs into the lungs every 6 (six) hours as needed for wheezing or shortness of breath.   Aspirin Low Dose 81 MG chewable tablet Generic drug: aspirin Chew 1 tablet (81 mg total) by mouth daily.   Brilinta 90 MG Tabs tablet Generic drug: ticagrelor Take 1 tablet (90 mg total) by mouth 2 (two) times daily. What changed: Another medication with the same name was removed. Continue taking this medication, and follow the directions you see here.   citalopram 20 MG tablet Commonly known as: CELEXA Take 20 mg by mouth at bedtime.   cyanocobalamin 1000 MCG tablet Take 1 tablet (1,000 mcg total) by mouth daily. Start taking on: May 02, 2022   hydrochlorothiazide 25 MG tablet Commonly known as: HYDRODIURIL Take 25 mg by mouth daily.   levETIRAcetam 500 MG tablet Commonly known as: KEPPRA Take 1 tablet (500 mg total) by mouth 2 (two) times daily.   NIFEdipine 30 MG 24 hr tablet Commonly known as: ADALAT CC Take 30 mg by mouth daily.   rosuvastatin 20 MG tablet Commonly known as:  CRESTOR Take 1 tablet (20 mg total) by mouth daily.   Vitamin D (Ergocalciferol) 1.25 MG (50000 UNIT) Caps capsule Commonly known as: DRISDOL Take 1 capsule (50,000 Units total) by mouth every 7 (seven) days. Start taking on: May 02, 2022       Allergies  Allergen Reactions   Tramadol Rash   Discharge Instructions     Call MD for:  difficulty breathing, headache or visual disturbances   Complete by: As directed    Call MD for:  extreme fatigue   Complete by: As directed    Call MD for:  persistant dizziness or light-headedness   Complete by: As directed    Call MD for:  persistant nausea and vomiting   Complete by: As directed    Call MD for:  severe  uncontrolled pain   Complete by: As directed    Call MD for:  temperature >100.4   Complete by: As directed    Diet - low sodium heart healthy   Complete by: As directed    Discharge instructions   Complete by: As directed    F/u PCP in  1wk F/u Neuro in 2-3 wks   Increase activity slowly   Complete by: As directed        The results of significant diagnostics from this hospitalization (including imaging, microbiology, ancillary and laboratory) are listed below for reference.    Significant Diagnostic Studies: EEG adult  Result Date: May 22, 2022 Greta Doom, MD     05/22/2022  6:35 PM History: 57 yo F being evaluated for syncope. Sedation: none Technique: This EEG was acquired with electrodes placed according to the International 10-20 electrode system (including Fp1, Fp2, F3, F4, C3, C4, P3, P4, O1, O2, T3, T4, T5, T6, A1, A2, Fz, Cz, Pz). The following electrodes were missing or displaced: none. Background: The background consists of intermixed alpha and beta activities. There is a well defined posterior dominant rhythm of 9 Hz that attenuates with eye opening. Sleep is recorded with normal appearing structures. During drowsiness there is a single sharp wave discharge which disrupts the background with phase reversal at T8. This is also seen at T8 > P8,F8 > O2. Photic stimulation: Physiologic driving is not performed EEG Abnormalities: 1) Right posterior quadrant sharp wave Clinical Interpretation: This EEG is suggestive of an area of potential epileptogenicity in the right posterior quadrant. There was no seizure recorded on this study. Roland Rack, MD [facility] [phone] If 7pm- 7am, please page neurology on call as listed in [place].   CT ABDOMEN PELVIS W CONTRAST  Result Date: 04/23/2022 CLINICAL DATA:  57 year old female status post right MCA ELVO infarct and endovascular reperfusion earlier this month. Syncope, seizure like activity, abdominal pain.  EXAM: CT ABDOMEN AND PELVIS WITH CONTRAST TECHNIQUE: Multidetector CT imaging of the abdomen and pelvis was performed using the standard protocol following bolus administration of intravenous contrast. RADIATION DOSE REDUCTION: This exam was performed according to the departmental dose-optimization program which includes automated exposure control, adjustment of the mA and/or kV according to patient size and/or use of iterative reconstruction technique. CONTRAST:  19m OMNIPAQUE IOHEXOL 300 MG/ML  SOLN COMPARISON:  CT Abdomen and Pelvis 07/18/2015. FINDINGS: Lower chest: Negative. Hepatobiliary: Stable liver and bile ducts in the setting of chronic cholecystectomy. Liver enhancement remains within normal limits. Pancreas: Negative. Spleen: Negative. Adrenals/Urinary Tract: Negative. Stomach/Bowel: Mild diverticulosis of the descending and proximal sigmoid colon segments, which appear mildly indistinct (series 5, image 25 left lower quadrant). Downstream rectum  and upstream transverse colon have a more normal appearance. There is some fluid in the right colon which is otherwise negative. Normal retrocecal appendix on series 2, image 60. No dilated small bowel. Stomach and duodenum appear negative. No free air, free fluid. Vascular/Lymphatic: Mild postoperative changes related to right inguinal femoral artery access site series 2 image 86. Visible right femoral arteries and other major arterial structures in the abdomen and pelvis are patent. Aortoiliac calcified atherosclerosis. Patent portal venous system. No lymphadenopathy identified. Reproductive: Within normal limits. Other: No pelvis free fluid. Musculoskeletal: Negative. IMPRESSION: 1. Difficult to exclude Mild Acute Colitis of the descending and sigmoid colon. Those segments appear indistinct, with mild underlying colonic diverticula. 2. But no other acute or inflammatory process identified in the abdomen or pelvis. Normal appendix. 3.  Aortic Atherosclerosis  (ICD10-I70.0). Electronically Signed   By: Genevie Ann M.D.   On: 04/23/2022 07:06   CT HEAD WO CONTRAST (5MM)  Result Date: 04/23/2022 CLINICAL DATA:  57 year old female with new onset headache, recurrent syncope, and seizure like activity. Status post right MCA emergent large vessel occlusion and stroke with endovascular reperfusion earlier this month. EXAM: CT HEAD WITHOUT CONTRAST TECHNIQUE: Contiguous axial images were obtained from the base of the skull through the vertex without intravenous contrast. RADIATION DOSE REDUCTION: This exam was performed according to the departmental dose-optimization program which includes automated exposure control, adjustment of the mA and/or kV according to patient size and/or use of iterative reconstruction technique. COMPARISON:  Brain MRI 04/10/2022.  Head CT 04/10/2022. FINDINGS: Brain: Subtle loss of gray-white differentiation in the affected right basal ganglia which were abnormal by MRI earlier this month. No hemorrhagic transformation or mass effect. No superimposed ventriculomegaly, evidence of mass lesion, intracranial hemorrhage or evidence of new cortically based acute infarction. 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 Vascular: Faint Calcified atherosclerosis at the skull base. No suspicious intracranial vascular hyperdensity. Skull: Stable, intact. Sinuses/Orbits: Visualized paranasal sinuses and mastoids are stable and well aerated. Other: Visualized orbits and scalp soft tissues are within normal limits. IMPRESSION: 1. Right basal ganglia MCA territory infarct earlier this month is subtle by CT with no hemorrhagic transformation or mass effect. 2. No new intracranial abnormality identified. Electronically Signed   By: Genevie Ann M.D.   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 Bilateral testing is considered an integral part of a complete examination. Limited examinations for reoccurring indications may be performed as noted. +------------+----------+--------+------+----------+ Right DuplexPSV (cm/s)WaveformPlaqueComment(s) +------------+----------+--------+------+----------+ CFA            110                             +------------+----------+--------+------+----------+ Prox SFA  99                             +------------+----------+--------+------+----------+ Right Vein comments:Patent right common femoral vein.  Summary: No evidence of right groin pseudoaneurysm or AVF.  Diagnosing physician: Orlie Pollen Electronically signed by Orlie Pollen on 04/12/2022 at 12:07:05 PM.   --------------------------------------------------------------------------------    Final    IR PERCUTANEOUS ART THROMBECTOMY/INFUSION INTRACRANIAL INC DIAG ANGIO  Result Date: 04/12/2022 INDICATION: Acute onset of left-sided weakness, neglect and right-sided gaze deviation. CT angiogram reveals occluded right middle cerebral artery with a prominent smooth filling defect with a 50-55% stenosis. EXAM: 1. EMERGENT LARGE VESSEL OCCLUSION THROMBOLYSIS (anterior CIRCULATION) COMPARISON:  CT angiogram of the head and neck of April 09, 2022. MEDICATIONS: Ancef 2 g IV antibiotic was administered within 1 hour of  the procedure. ANESTHESIA/SEDATION: General anesthesia. CONTRAST:  Omnipaque 300 90 cc. FLUOROSCOPY TIME:  Fluoroscopy Time: 19 minutes 0 seconds (1054 mGy). COMPLICATIONS: None immediate. TECHNIQUE: Following a full explanation of the procedure along with the potential associated complications, an informed witnessed consent was obtained. The risks of intracranial hemorrhage of 10%, worsening neurological deficit, ventilator dependency, death and inability to revascularize were all reviewed in detail with the patient's mother. The patient was then put under general anesthesia by the Department of Anesthesiology at [facility]. The right groin was prepped and draped in the usual sterile fashion. Thereafter using modified Seldinger technique, transfemoral access into the right common femoral artery was obtained without difficulty. Over an 0.035 inch guidewire a combination of a 5.5 French 125 cm support catheter inside of an 087 90 cm balloon guide catheter was advanced to the aortic arch region, and selectively positioned just proximal to the right internal carotid artery. The guidewire, and the support catheter were removed. Good aspiration obtained from the hub of the balloon guide catheter. A gentle control arteriogram performed centered extra cranially and intracranially. FINDINGS: The right external carotid artery and its major branches demonstrate wide patency. The right internal carotid artery at the bulb demonstrates a prominent smooth lobulated filling defect with narrowing of approximately 70% by the NASCET criteria. More distally, the right internal carotid artery is seen to ascend normally to the cranial skull base. The petrous, the cavernous and the supraclinoid right ICA demonstrate wide patency. A prominent posterior communicating artery is seen opacifying the right posterior cerebral artery distribution. 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 The right  anterior cerebral artery opacifies into the capillary and venous phases. PROCEDURE: Through the balloon guide catheter at the bulb, an 070 aspiration catheter with its Tenzing support catheter were navigated under constant fluoroscopic guidance without any difficulty to the supraclinoid right ICA. The combination was then advanced through the occluded right middle cerebral artery with the 070 aspiration imbedded in the occluded right M1 segment. The Tenzing cm support catheter was removed. Aspiration was then applied at the hub of the 070 cm aspiration catheter for a minute and a half with proximal flow arrest. The aspiration catheter was removed. Two large clots were identified and the canister. Following reversal of flow arrest, a control arteriogram performed through the balloon guide catheter demonstrated complete revascularization of right MCA distribution achieving a TICI 3 revascularization. The right anterior cerebral artery and the posterior communicating artery remained widely patent. An exchange 300 cm 014 inch Zoom soft tip micro guidewire was then advanced through the balloon guide catheter in the mid  M1 segment with widening advanced to the horizontal petrous segment. The balloon guide was then gently retrieved to just proximal to the bifurcation. A control arteriogram performed through this the prominent smooth shelf-like filling defect extending circumferentially and also into the lumen with a significant stenosis. Measurements were performed of the internal carotid artery distal to the bulb and proximally the distal right common carotid artery. A 6 mm x 8 mm/4 cm Xact stent delivery system was then prepped and purged retrogradely with heparinized saline infusion. Using the rapid exchange technique, this was advanced without difficulty over the exchange micro guidewire and positioned such that the distal and proximal markers were at the desired locations. Stent was then deployed in the usual manner  without difficulty. The delivery apparatus was retrieved and removed. A control arteriogram performed through the balloon guide catheter in the right common carotid artery demonstrated excellent apposition with significantly improved caliber at the bulb. More distally, the right MCA distribution continued to demonstrated a TICI 3 revascularization. Arteriograms were then performed at 10 and 20 minutes post deployment of the stent. These continued to demonstrate excellent apposition of the stent proximally, with patency of the right MCA distribution. A final control arteriogram was then performed through the balloon guide catheter after removal of the exchange micro guidewire. Patency of the stent and apposition was maintained. No evidence of intra stent irregularities evident angiographically. Distally the right MCA and the right anterior cerebral artery and the right posterior communicating artery demonstrated wide patency. The balloon guide catheter was removed. An 8 French Angio-Seal closure device was then deployed at the right groin puncture site with hemostasis. Distal pulses remained palpable in both feet unchanged. A flat panel CT of the brain demonstrated no evidence of hemorrhagic complications. Additionally, manual compression with quick clot was applied for 30 minutes in view of slight contrast extravasation noted at the right common femoral artery puncture site. Patient was left intubated and transferred to the ICU for post revascularization care. Patient was loaded with aspirin 81 mg and Brilinta 180 mg prior to the placement of the stent. Additionally, the patient was given a bolus dose of IV cangrelor followed by a 4 hour infusion, which was stopped after infusion of quarter of the a dose due to the right common femoral slight contrast extravasation. IMPRESSION: Status post complete revascularization of occluded right M1 segment with 1 pass with an 070 FreeClimb aspiration catheter flow arrest  achieving a TICI 3 revascularization. 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 PLAN: Follow-up in clinic 2 weeks post discharge. Electronically Signed   By: Luanne Bras M.D.   On: 04/12/2022 08:27   IR CT Head Ltd  Result Date: 04/12/2022 INDICATION: Acute onset of left-sided weakness, neglect and right-sided gaze deviation. CT angiogram reveals occluded right middle cerebral artery with a prominent smooth filling defect with a 50-55% stenosis. EXAM: 1. EMERGENT LARGE VESSEL OCCLUSION THROMBOLYSIS (anterior CIRCULATION) COMPARISON:  CT angiogram of the head and neck of April 09, 2022. MEDICATIONS: Ancef 2 g IV antibiotic was administered within 1 hour of the procedure. ANESTHESIA/SEDATION: General anesthesia. CONTRAST:  Omnipaque 300 90 cc. FLUOROSCOPY TIME:  Fluoroscopy Time: 19 minutes 0 seconds (1054 mGy). COMPLICATIONS: None immediate. TECHNIQUE: Following a full explanation of the procedure along with the potential associated complications, an informed witnessed consent was obtained. The risks of intracranial hemorrhage of 10%, worsening neurological deficit, ventilator dependency, death and inability to  revascularize were all reviewed in detail with the patient's mother. The patient was then put under general anesthesia by the Department of Anesthesiology at [facility]. The right groin was prepped and draped in the usual sterile fashion. Thereafter using modified Seldinger technique, transfemoral access into the right common femoral artery was obtained without difficulty. Over an 0.035 inch guidewire a combination of a 5.5 French 125 cm support catheter inside of an 087 90 cm balloon guide catheter was advanced to the aortic arch region, and selectively positioned just proximal to the right internal carotid artery. The guidewire, and the support catheter were removed. Good aspiration  obtained from the hub of the balloon guide catheter. A gentle control arteriogram performed centered extra cranially and intracranially. FINDINGS: The right external carotid artery and its major branches demonstrate wide patency. The right internal carotid artery at the bulb demonstrates a prominent smooth lobulated filling defect with narrowing of approximately 70% by the NASCET criteria. More distally, the right internal carotid artery is seen to ascend normally to the cranial skull base. The petrous, the cavernous and the supraclinoid right ICA demonstrate wide patency. A prominent posterior communicating artery is seen opacifying the right posterior cerebral artery distribution. 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 The right anterior cerebral artery opacifies into the capillary and venous phases. PROCEDURE: Through the balloon guide catheter at the bulb, an 070 aspiration catheter with its Tenzing support catheter were navigated under constant fluoroscopic guidance without any difficulty to the supraclinoid right ICA. The combination was then advanced through the occluded right middle cerebral artery with the 070 aspiration imbedded in the occluded right M1 segment. The Tenzing cm support catheter was removed. Aspiration was then applied at the hub of the 070 cm aspiration catheter for a minute and a half with proximal flow arrest. The aspiration catheter was removed. Two large clots were identified and the canister. Following reversal of flow arrest, a control arteriogram performed through the balloon guide catheter demonstrated complete revascularization of right MCA distribution achieving a TICI 3 revascularization. The right anterior cerebral artery and the posterior communicating artery remained widely patent. An exchange 300 cm 014 inch Zoom soft tip micro guidewire was then advanced through the balloon guide catheter in the mid M1 segment with widening advanced to the  horizontal petrous segment. The balloon guide was then gently retrieved to just proximal to the bifurcation. A control arteriogram performed through this the prominent smooth shelf-like filling defect extending circumferentially and also into the lumen with a significant stenosis. Measurements were performed of the internal carotid artery distal to the bulb and proximally the distal right common carotid artery. A 6 mm x 8 mm/4 cm Xact stent delivery system was then prepped and purged retrogradely with heparinized saline infusion. Using the rapid exchange technique, this was advanced without difficulty over the exchange micro guidewire and positioned such that the distal and proximal markers were at the desired locations. Stent was then deployed in the usual manner without difficulty. The delivery apparatus was retrieved and removed. A control arteriogram performed through the balloon guide catheter in the right common carotid artery demonstrated excellent apposition with significantly improved caliber at the bulb. More distally, the right MCA distribution continued to demonstrated a TICI 3 revascularization. Arteriograms were then performed at 10 and 20 minutes post deployment of the stent. These continued to demonstrate excellent apposition of the stent proximally, with patency of the right MCA distribution. A final control arteriogram was then  performed through the balloon guide catheter after removal of the exchange micro guidewire. Patency of the stent and apposition was maintained. No evidence of intra stent irregularities evident angiographically. Distally the right MCA and the right anterior cerebral artery and the right posterior communicating artery demonstrated wide patency. The balloon guide catheter was removed. An 8 French Angio-Seal closure device was then deployed at the right groin puncture site with hemostasis. Distal pulses remained palpable in both feet unchanged. A flat panel CT of the brain  demonstrated no evidence of hemorrhagic complications. Additionally, manual compression with quick clot was applied for 30 minutes in view of slight contrast extravasation noted at the right common femoral artery puncture site. Patient was left intubated and transferred to the ICU for post revascularization care. Patient was loaded with aspirin 81 mg and Brilinta 180 mg prior to the placement of the stent. Additionally, the patient was given a bolus dose of IV cangrelor followed by a 4 hour infusion, which was stopped after infusion of quarter of the a dose due to the right common femoral slight contrast extravasation. IMPRESSION: Status post complete revascularization of occluded right M1 segment with 1 pass with an 070 FreeClimb aspiration catheter flow arrest achieving a TICI 3 revascularization. 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 PLAN: Follow-up in clinic 2 weeks post discharge. Electronically Signed   By: Luanne Bras M.D.   On: 04/12/2022 08:27   IR INTRAVSC STENT CERV CAROTID W/O EMB-PROT MOD SED  Result Date: 04/12/2022 INDICATION: Acute onset of left-sided weakness, neglect and right-sided gaze deviation. CT angiogram reveals occluded right middle cerebral artery with a prominent smooth filling defect with a 50-55% stenosis. EXAM: 1. EMERGENT LARGE VESSEL OCCLUSION THROMBOLYSIS (anterior CIRCULATION) COMPARISON:  CT angiogram of the head and neck of April 09, 2022. MEDICATIONS: Ancef 2 g IV antibiotic was administered within 1 hour of the procedure. ANESTHESIA/SEDATION: General anesthesia. CONTRAST:  Omnipaque 300 90 cc. FLUOROSCOPY TIME:  Fluoroscopy Time: 19 minutes 0 seconds (1054 mGy). COMPLICATIONS: None immediate. TECHNIQUE: Following a full explanation of the procedure along with the potential associated complications, an informed witnessed consent was obtained. The risks of  intracranial hemorrhage of 10%, worsening neurological deficit, ventilator dependency, death and inability to revascularize were all reviewed in detail with the patient's mother. The patient was then put under general anesthesia by the Department of Anesthesiology at [facility]. The right groin was prepped and draped in the usual sterile fashion. Thereafter using modified Seldinger technique, transfemoral access into the right common femoral artery was obtained without difficulty. Over an 0.035 inch guidewire a combination of a 5.5 French 125 cm support catheter inside of an 087 90 cm balloon guide catheter was advanced to the aortic arch region, and selectively positioned just proximal to the right internal carotid artery. The guidewire, and the support catheter were removed. Good aspiration obtained from the hub of the balloon guide catheter. A gentle control arteriogram performed centered extra cranially and intracranially. FINDINGS: The right external carotid artery and its major branches demonstrate wide patency. The right internal carotid artery at the bulb demonstrates a prominent smooth lobulated filling defect with narrowing of approximately 70% by the NASCET criteria. More distally, the right internal carotid artery is seen to ascend normally to the cranial skull base. The petrous, the cavernous and the supraclinoid right ICA demonstrate wide patency. A prominent posterior communicating artery is seen opacifying the right  posterior cerebral artery distribution. 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 The right anterior cerebral artery opacifies into the capillary and venous phases. PROCEDURE: Through the balloon guide catheter at the bulb, an 070 aspiration catheter with its Tenzing support catheter were navigated under constant fluoroscopic guidance without any difficulty to the supraclinoid right ICA. The combination was then advanced through the occluded right  middle cerebral artery with the 070 aspiration imbedded in the occluded right M1 segment. The Tenzing cm support catheter was removed. Aspiration was then applied at the hub of the 070 cm aspiration catheter for a minute and a half with proximal flow arrest. The aspiration catheter was removed. Two large clots were identified and the canister. Following reversal of flow arrest, a control arteriogram performed through the balloon guide catheter demonstrated complete revascularization of right MCA distribution achieving a TICI 3 revascularization. The right anterior cerebral artery and the posterior communicating artery remained widely patent. An exchange 300 cm 014 inch Zoom soft tip micro guidewire was then advanced through the balloon guide catheter in the mid M1 segment with widening advanced to the horizontal petrous segment. The balloon guide was then gently retrieved to just proximal to the bifurcation. A control arteriogram performed through this the prominent smooth shelf-like filling defect extending circumferentially and also into the lumen with a significant stenosis. Measurements were performed of the internal carotid artery distal to the bulb and proximally the distal right common carotid artery. A 6 mm x 8 mm/4 cm Xact stent delivery system was then prepped and purged retrogradely with heparinized saline infusion. Using the rapid exchange technique, this was advanced without difficulty over the exchange micro guidewire and positioned such that the distal and proximal markers were at the desired locations. Stent was then deployed in the usual manner without difficulty. The delivery apparatus was retrieved and removed. A control arteriogram performed through the balloon guide catheter in the right common carotid artery demonstrated excellent apposition with significantly improved caliber at the bulb. More distally, the right MCA distribution continued to demonstrated a TICI 3 revascularization.  Arteriograms were then performed at 10 and 20 minutes post deployment of the stent. These continued to demonstrate excellent apposition of the stent proximally, with patency of the right MCA distribution. A final control arteriogram was then performed through the balloon guide catheter after removal of the exchange micro guidewire. Patency of the stent and apposition was maintained. No evidence of intra stent irregularities evident angiographically. Distally the right MCA and the right anterior cerebral artery and the right posterior communicating artery demonstrated wide patency. The balloon guide catheter was removed. An 8 French Angio-Seal closure device was then deployed at the right groin puncture site with hemostasis. Distal pulses remained palpable in both feet unchanged. A flat panel CT of the brain demonstrated no evidence of hemorrhagic complications. Additionally, manual compression with quick clot was applied for 30 minutes in view of slight contrast extravasation noted at the right common femoral artery puncture site. Patient was left intubated and transferred to the ICU for post revascularization care. Patient was loaded with aspirin 81 mg and Brilinta 180 mg prior to the placement of the stent. Additionally, the patient was given a bolus dose of IV cangrelor followed by a 4 hour infusion, which was stopped after infusion of quarter of the a dose due to the right common femoral slight contrast extravasation. IMPRESSION: Status post complete revascularization of occluded right M1 segment with 1 pass with an 070 FreeClimb  aspiration catheter flow arrest achieving a TICI 3 revascularization. 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 PLAN: Follow-up in clinic 2 weeks post discharge. Electronically Signed   By: Luanne Bras M.D.   On: 04/12/2022 08:27   IR CT Head Ltd  Result Date:  04/12/2022 INDICATION: Acute onset of left-sided weakness, neglect and right-sided gaze deviation. CT angiogram reveals occluded right middle cerebral artery with a prominent smooth filling defect with a 50-55% stenosis. EXAM: 1. EMERGENT LARGE VESSEL OCCLUSION THROMBOLYSIS (anterior CIRCULATION) COMPARISON:  CT angiogram of the head and neck of April 09, 2022. MEDICATIONS: Ancef 2 g IV antibiotic was administered within 1 hour of the procedure. ANESTHESIA/SEDATION: General anesthesia. CONTRAST:  Omnipaque 300 90 cc. FLUOROSCOPY TIME:  Fluoroscopy Time: 19 minutes 0 seconds (1054 mGy). COMPLICATIONS: None immediate. TECHNIQUE: Following a full explanation of the procedure along with the potential associated complications, an informed witnessed consent was obtained. The risks of intracranial hemorrhage of 10%, worsening neurological deficit, ventilator dependency, death and inability to revascularize were all reviewed in detail with the patient's mother. The patient was then put under general anesthesia by the Department of Anesthesiology at [facility]. The right groin was prepped and draped in the usual sterile fashion. Thereafter using modified Seldinger technique, transfemoral access into the right common femoral artery was obtained without difficulty. Over an 0.035 inch guidewire a combination of a 5.5 French 125 cm support catheter inside of an 087 90 cm balloon guide catheter was advanced to the aortic arch region, and selectively positioned just proximal to the right internal carotid artery. The guidewire, and the support catheter were removed. Good aspiration obtained from the hub of the balloon guide catheter. A gentle control arteriogram performed centered extra cranially and intracranially. FINDINGS: The right external carotid artery and its major branches demonstrate wide patency. The right internal carotid artery at the bulb demonstrates a prominent smooth lobulated filling defect with  narrowing of approximately 70% by the NASCET criteria. More distally, the right internal carotid artery is seen to ascend normally to the cranial skull base. The petrous, the cavernous and the supraclinoid right ICA demonstrate wide patency. A prominent posterior communicating artery is seen opacifying the right posterior cerebral artery distribution. 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 The right anterior cerebral artery opacifies into the capillary and venous phases. PROCEDURE: Through the balloon guide catheter at the bulb, an 070 aspiration catheter with its Tenzing support catheter were navigated under constant fluoroscopic guidance without any difficulty to the supraclinoid right ICA. The combination was then advanced through the occluded right middle cerebral artery with the 070 aspiration imbedded in the occluded right M1 segment. The Tenzing cm support catheter was removed. Aspiration was then applied at the hub of the 070 cm aspiration catheter for a minute and a half with proximal flow arrest. The aspiration catheter was removed. Two large clots were identified and the canister. Following reversal of flow arrest, a control arteriogram performed through the balloon guide catheter demonstrated complete revascularization of right MCA distribution achieving a TICI 3 revascularization. The right anterior cerebral artery and the posterior communicating artery remained widely patent. An exchange 300 cm 014 inch Zoom soft tip micro guidewire was then advanced through the balloon guide catheter in the mid M1 segment with widening advanced to the horizontal petrous segment. The balloon guide was then gently retrieved to just proximal to  the bifurcation. A control arteriogram performed through this the prominent smooth shelf-like filling defect extending circumferentially and also into the lumen with a significant stenosis. Measurements were performed of the internal carotid  artery distal to the bulb and proximally the distal right common carotid artery. A 6 mm x 8 mm/4 cm Xact stent delivery system was then prepped and purged retrogradely with heparinized saline infusion. Using the rapid exchange technique, this was advanced without difficulty over the exchange micro guidewire and positioned such that the distal and proximal markers were at the desired locations. Stent was then deployed in the usual manner without difficulty. The delivery apparatus was retrieved and removed. A control arteriogram performed through the balloon guide catheter in the right common carotid artery demonstrated excellent apposition with significantly improved caliber at the bulb. More distally, the right MCA distribution continued to demonstrated a TICI 3 revascularization. Arteriograms were then performed at 10 and 20 minutes post deployment of the stent. These continued to demonstrate excellent apposition of the stent proximally, with patency of the right MCA distribution. A final control arteriogram was then performed through the balloon guide catheter after removal of the exchange micro guidewire. Patency of the stent and apposition was maintained. No evidence of intra stent irregularities evident angiographically. Distally the right MCA and the right anterior cerebral artery and the right posterior communicating artery demonstrated wide patency. The balloon guide catheter was removed. An 8 French Angio-Seal closure device was then deployed at the right groin puncture site with hemostasis. Distal pulses remained palpable in both feet unchanged. A flat panel CT of the brain demonstrated no evidence of hemorrhagic complications. Additionally, manual compression with quick clot was applied for 30 minutes in view of slight contrast extravasation noted at the right common femoral artery puncture site. Patient was left intubated and transferred to the ICU for post revascularization care. Patient was loaded with  aspirin 81 mg and Brilinta 180 mg prior to the placement of the stent. Additionally, the patient was given a bolus dose of IV cangrelor followed by a 4 hour infusion, which was stopped after infusion of quarter of the a dose due to the right common femoral slight contrast extravasation. IMPRESSION: Status post complete revascularization of occluded right M1 segment with 1 pass with an 070 FreeClimb aspiration catheter flow arrest achieving a TICI 3 revascularization. 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 PLAN: Follow-up in clinic 2 weeks post discharge. Electronically Signed   By: Luanne Bras M.D.   On: 04/12/2022 08:27   ECHOCARDIOGRAM COMPLETE  Result Date: 04/11/2022    ECHOCARDIOGRAM REPORT   Patient Name:   Patricia [name] Date of Exam: 04/10/2022 Medical Rec #:  [id number]       Height:       65.0 in Accession #:    [id number]      Weight:       176.8 lb Date of Birth:  1965-03-10       BSA:          1.877 m Patient Age:    40 years        BP:           122/82 mmHg Patient Gender: F               HR:           80 bpm. Exam Location:  Inpatient Procedure: 2D Echo, Color Doppler  and Cardiac Doppler Indications:    stroke  History:        Patient has no prior history of Echocardiogram examinations.                 Risk Factors:Hypertension and Current Smoker.  Sonographer:    Johny Chess RDCS Referring Phys: [id number] [place]  1. Left ventricular ejection fraction, by estimation, is 60 to 65%. The left ventricle has normal function. Left ventricular endocardial border not optimally defined to evaluate regional wall motion. Left ventricular diastolic parameters were normal.  2. Right ventricular systolic function is normal. The right ventricular size is normal. There is normal pulmonary artery systolic pressure. The estimated right ventricular systolic pressure is 123456 mmHg.  3.  The mitral valve is normal in structure. Trivial mitral valve regurgitation. No evidence of mitral stenosis.  4. The aortic valve was not well visualized. Aortic valve regurgitation is not visualized. No aortic stenosis is present.  5. The inferior vena cava is normal in size with greater than 50% respiratory variability, suggesting right atrial pressure of 3 mmHg. FINDINGS  Left Ventricle: Left ventricular ejection fraction, by estimation, is 60 to 65%. The left ventricle has normal function. Left ventricular endocardial border not optimally defined to evaluate regional wall motion. The left ventricular internal cavity size was normal in size. There is no left ventricular hypertrophy. Left ventricular diastolic parameters were normal. Right Ventricle: The right ventricular size is normal. No increase in right ventricular wall thickness. Right ventricular systolic function is normal. There is normal pulmonary artery systolic pressure. The tricuspid regurgitant velocity is 2.02 m/s, and  with an assumed right atrial pressure of 3 mmHg, the estimated right ventricular systolic pressure is 123456 mmHg. Left Atrium: Left atrial size was normal in size. Right Atrium: Right atrial size was normal in size. Pericardium: Trivial pericardial effusion is present. Mitral Valve: The mitral valve is normal in structure. Trivial mitral valve regurgitation. No evidence of mitral valve stenosis. Tricuspid Valve: The tricuspid valve is normal in structure. Tricuspid valve regurgitation is trivial. No evidence of tricuspid stenosis. Aortic Valve: The aortic valve was not well visualized. Aortic valve regurgitation is not visualized. No aortic stenosis is present. Pulmonic Valve: The pulmonic valve was normal in structure. Pulmonic valve regurgitation is trivial. No evidence of pulmonic stenosis. Aorta: The aortic root is normal in size and structure. Venous: The inferior vena cava is normal in size with greater than 50% respiratory  variability, suggesting right atrial pressure of 3 mmHg. IAS/Shunts: No atrial level shunt detected by color flow Doppler.  LEFT VENTRICLE PLAX 2D LVIDd:         4.00 cm   Diastology LVIDs:         2.40 cm   LV e' medial:  8.92 cm/s LV PW:         0.80 cm   LV e' lateral: 7.51 cm/s LV IVS:        0.70 cm LVOT diam:     2.00 cm LV SV:         60 LV SV Index:   32 LVOT Area:     3.14 cm  RIGHT VENTRICLE             IVC RV S prime:     14.00 cm/s  IVC diam: 1.40 cm TAPSE (M-mode): 2.5 cm LEFT ATRIUM           Index        RIGHT ATRIUM  Index LA diam:      2.60 cm 1.39 cm/m   RA Area:     10.40 cm LA Vol (A2C): 26.6 ml 14.17 ml/m  RA Volume:   20.70 ml  11.03 ml/m  AORTIC VALVE LVOT Vmax:   86.60 cm/s LVOT Vmean:  58.200 cm/s LVOT VTI:    0.191 m  AORTA Ao Root diam: 3.10 cm TRICUSPID VALVE TR Peak grad:   16.3 mmHg TR Vmax:        202.00 cm/s  SHUNTS Systemic VTI:  0.19 m Systemic Diam: 2.00 cm Cherlynn Kaiser MD Electronically signed by Cherlynn Kaiser MD Signature Date/Time: 04/11/2022/10:49:24 AM    Final    MR ANGIO HEAD WO CONTRAST  Result Date: 04/10/2022 CLINICAL DATA:  Follow-up examination for acute stroke, status post TNK and thrombectomy. EXAM: MRI HEAD WITHOUT CONTRAST MRA HEAD WITHOUT CONTRAST TECHNIQUE: Multiplanar, multi-echo pulse sequences of the brain and surrounding structures were acquired without intravenous contrast. Angiographic images of the Circle of Willis were acquired using MRA technique without intravenous contrast. COMPARISON:  Comparison made with prior CTs from 04/09/2022. FINDINGS: MRI HEAD FINDINGS Brain: Cerebral volume within normal limits. No significant cerebral white matter disease for age. Restricted diffusion involving the right caudate and lentiform nuclei, consistent with acute right MCA distribution infarct (series 2, image 32). No associated hemorrhage or significant regional mass effect. No other evidence for acute or subacute ischemia elsewhere within the  brain. Gray-white matter differentiation otherwise maintained. No acute intracranial hemorrhage. Single punctate chronic microhemorrhage noted at the right occipital region, of doubtful significance in isolation. Incidental small lipoma noted at the left quadrigeminal plate cistern. No other mass lesion, midline shift, or mass effect. Ventricles normal size without hydrocephalus. No extra-axial fluid collection. Possible but are an arachnoid cyst measuring 8 mm noted at the sellar/suprasellar region (series 5, image 13). This is of doubtful significance. Pituitary gland and suprasellar region otherwise unremarkable. Vascular: Major intracranial vascular flow voids are maintained. Skull and upper cervical spine: Craniocervical junction within normal limits. Bone marrow signal intensity normal. No scalp soft tissue abnormality. Sinuses/Orbits: Probable small dacryocystocele at the medial left orbit measures 9 mm (series 6, image 9). No associated inflammatory changes. Globes and orbital soft tissues otherwise unremarkable. Few small maxillary sinus retention cyst noted. Paranasal sinuses are otherwise largely clear. No mastoid effusion. Other: Endotracheal and enteric tubes in place. MRA HEAD FINDINGS Anterior circulation: Both internal carotid arteries are widely patent to the termini without stenosis or other abnormality. A1 segments patent bilaterally. Right A1 hypoplastic. Normal anterior communicating artery complex. Both ACAs remain patent to their distal aspects. There has been interval revascularization of previously seen right M1 occlusion. Right MCA and its distal branches are now widely patent and well perfused. Left M1 segment and its distal branches remain widely patent and well perfused as well. Posterior circulation: Vertebral arteries are diminutive but patent without stenosis. Both PICA patent. Basilar markedly diminutive but patent to its distal aspect without appreciable stenosis. Right AICA of  supplied via the right vertebral artery. Superior cerebellar arteries patent bilaterally. Fetal type origin of the PCAs, both of which remain widely patent to their distal aspects. Anatomic variants: As above.  No aneurysm. IMPRESSION: MRI HEAD IMPRESSION: 1. 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 No associated hemorrhage or significant regional mass effect. 2. Otherwise normal brain MRI for age. MRA HEAD IMPRESSION: 1. Interval revascularization of previously seen right M1 occlusion. Right MCA and its distal branches  are now widely patent and well perfused. 2. Otherwise stable and normal intracranial MRA. Electronically Signed   By: Jeannine Boga M.D.   On: 04/10/2022 23:09   MR BRAIN WO CONTRAST  Result Date: 04/10/2022 CLINICAL DATA:  Follow-up examination for acute stroke, status post TNK and thrombectomy. EXAM: MRI HEAD WITHOUT CONTRAST MRA HEAD WITHOUT CONTRAST TECHNIQUE: Multiplanar, multi-echo pulse sequences of the brain and surrounding structures were acquired without intravenous contrast. Angiographic images of the Circle of Willis were acquired using MRA technique without intravenous contrast. COMPARISON:  Comparison made with prior CTs from 04/09/2022. FINDINGS: MRI HEAD FINDINGS Brain: Cerebral volume within normal limits. No significant cerebral white matter disease for age. Restricted diffusion involving the right caudate and lentiform nuclei, consistent with acute right MCA distribution infarct (series 2, image 32). No associated hemorrhage or significant regional mass effect. No other evidence for acute or subacute ischemia elsewhere within the brain. Gray-white matter differentiation otherwise maintained. No acute intracranial hemorrhage. Single punctate chronic microhemorrhage noted at the right occipital region, of doubtful significance in isolation. Incidental small lipoma noted at the left quadrigeminal plate cistern. No other mass  lesion, midline shift, or mass effect. Ventricles normal size without hydrocephalus. No extra-axial fluid collection. Possible but are an arachnoid cyst measuring 8 mm noted at the sellar/suprasellar region (series 5, image 13). This is of doubtful significance. Pituitary gland and suprasellar region otherwise unremarkable. Vascular: Major intracranial vascular flow voids are maintained. Skull and upper cervical spine: Craniocervical junction within normal limits. Bone marrow signal intensity normal. No scalp soft tissue abnormality. Sinuses/Orbits: Probable small dacryocystocele at the medial left orbit measures 9 mm (series 6, image 9). No associated inflammatory changes. Globes and orbital soft tissues otherwise unremarkable. Few small maxillary sinus retention cyst noted. Paranasal sinuses are otherwise largely clear. No mastoid effusion. Other: Endotracheal and enteric tubes in place. MRA HEAD FINDINGS Anterior circulation: Both internal carotid arteries are widely patent to the termini without stenosis or other abnormality. A1 segments patent bilaterally. Right A1 hypoplastic. Normal anterior communicating artery complex. Both ACAs remain patent to their distal aspects. There has been interval revascularization of previously seen right M1 occlusion. Right MCA and its distal branches are now widely patent and well perfused. Left M1 segment and its distal branches remain widely patent and well perfused as well. Posterior circulation: Vertebral arteries are diminutive but patent without stenosis. Both PICA patent. Basilar markedly diminutive but patent to its distal aspect without appreciable stenosis. Right AICA of supplied via the right vertebral artery. Superior cerebellar arteries patent bilaterally. Fetal type origin of the PCAs, both of which remain widely patent to their distal aspects. Anatomic variants: As above.  No aneurysm. IMPRESSION: MRI HEAD IMPRESSION: 1. Acute right MCA distribution infarct  involving the right caudate and lentiform nuclei. No associated hemorrhage or significant regional mass effect. 2. Otherwise normal brain MRI for age. MRA HEAD IMPRESSION: 1. Interval revascularization of previously seen right M1 occlusion. Right MCA and its distal branches are now widely patent and well perfused. 2. Otherwise stable and normal intracranial MRA. Electronically Signed   By: Jeannine Boga M.D.   On: 04/10/2022 23:09   VAS [place] GROIN PSEUDOANEURYSM  Result Date: 04/10/2022  ARTERIAL PSEUDOANEURYSM  Patient Name:  Patricia [name]  Date of Exam:   04/10/2022 Medical Rec #: [id number]        Accession #:    [id number] Date of Birth: [birth year]        Patient Gender: F Patient Age:  56 years Exam Location:  [facility] Procedure:      VAS [place] GROIN PSEUDOANEURYSM Referring Phys: Luanne Bras --------------------------------------------------------------------------------  Exam: Right groin Indications: Patient complains of oozing from stick site. 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 Comparison Study: No prior study Performing Technologist: Sharion Dove RVS  Examination Guidelines: A complete evaluation includes B-mode imaging, spectral Doppler, color Doppler, and power Doppler as needed of all accessible portions of each vessel. Bilateral testing is considered an integral part of a complete examination. Limited examinations for reoccurring indications may be performed as noted.  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 The neck measures approximately 0.4 cm wide and 0.4 cm long.  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 Diagnosing physician: Harold Barban MD Electronically signed by Harold Barban MD on 04/10/2022 at 8:13:26 PM.   --------------------------------------------------------------------------------    Final    DG CHEST PORT 1 VIEW  Result Date: 04/10/2022 CLINICAL DATA:  Endotracheal tube placement EXAM: PORTABLE CHEST 1 VIEW COMPARISON:  Chest x-ray October 30, 2019 FINDINGS: The endotracheal tube terminates approximately 3.5 cm above the carina. The NG tube side port terminates below the diaphragm in the distal tip is below the field of view. The cardiomediastinal silhouette is unchanged in contour. No focal pulmonary opacity. No pleural effusion or pneumothorax. The visualized upper abdomen is unremarkable. No acute osseous abnormality. IMPRESSION: 1. Adequate positioning of the endotracheal tube and NG tube. 2. No acute cardiopulmonary abnormality. Electronically Signed   By: Beryle Flock M.D.   On: 04/10/2022 14:04   DG Abd Portable 1V  Result Date: 04/10/2022 CLINICAL DATA:  OG tube placement. EXAM: PORTABLE ABDOMEN - 1 VIEW COMPARISON:  None Available. FINDINGS: OG tube tip is in the mid stomach. Proximal side port is at or just below the level of the GE junction. Tube could be advanced another 3-4 cm to ensure side port is below the GE junction, as clinically warranted visualized abdomen demonstrates nonspecific gas pattern. Contrast excretion noted from both kidneys compatible with contrast infused studies from 04/09/2022. IMPRESSION: OG tube tip is in the mid stomach. Proximal side port is at or just below the level of the GE junction. See above. Electronically Signed   By: Misty Stanley M.D.   On: 04/10/2022 07:34   CT HEAD WO CONTRAST (5MM)  Result Date: 04/10/2022 CLINICAL DATA:  Stroke follow-up. EXAM: CT HEAD WITHOUT CONTRAST TECHNIQUE: Contiguous axial images were obtained from the base of the skull through the vertex without intravenous contrast. RADIATION DOSE  REDUCTION: This exam was performed according to the departmental dose-optimization program which includes automated exposure control, adjustment of the mA and/or kV according to patient size and/or use of iterative reconstruction technique. COMPARISON:  Head CT from yesterday FINDINGS: Brain: No evidence of acute infarction, hemorrhage, hydrocephalus, extra-axial collection or mass lesion/mass effect. Vascular: Prominent density of right M1 segment but only at the level of streak artifact on coronal reformats. Skull: Normal. Negative for fracture or focal lesion. Sinuses/Orbits: Negative IMPRESSION: Negative for hemorrhage or visible infarct. Electronically Signed   By: Jorje Guild M.D.   On: 04/10/2022 04:02   CT ANGIO HEAD NECK W WO CM (CODE STROKE)  Result Date: 04/09/2022 CLINICAL DATA:  Initial evaluation for neuro deficit, stroke. EXAM: CT ANGIOGRAPHY HEAD AND NECK TECHNIQUE: Multidetector CT imaging of the head and neck was performed using the standard protocol during bolus administration of intravenous contrast. Multiplanar CT image reconstructions and MIPs were obtained to evaluate the vascular anatomy. Carotid stenosis measurements (when applicable) are obtained utilizing NASCET criteria, using the distal internal carotid diameter as the denominator. RADIATION DOSE REDUCTION: This exam was performed according to the departmental dose-optimization program which includes automated exposure control, adjustment of the mA and/or kV according to patient size and/or use of iterative reconstruction technique. CONTRAST:  81m OMNIPAQUE IOHEXOL 350 MG/ML SOLN COMPARISON:  Prior CT from earlier the same day. FINDINGS: CTA NECK FINDINGS Aortic arch: Visualized aortic arch normal caliber with standard branch pattern. No stenosis about the origin of the great vessels. Right carotid system: Right common and internal carotid arteries are patent without dissection. Transverse filling defect involving the proximal  cervical right ICA just distal to the bifurcation most characteristic of a carotid web (series 9, image 52). Associated short-segment stenosis of up to 55% by NASCET criteria. Left carotid system: Left common and internal carotid arteries are patent without stenosis or dissection. Vertebral arteries: Both vertebral arteries arise from the subclavian arteries. No proximal subclavian artery stenosis. Vertebral arteries are diminutive but patent without stenosis or dissection. Skeleton: No discrete or worrisome osseous lesions. Mild-to-moderate spondylosis noted at C5-6 and C6-7. Other neck: No other acute soft tissue abnormality within the neck. 1.8 cm heterogeneous left thyroid noted This has been evaluated on previous imaging, most recently with prior ultrasound from 07/26/2017. (Ref: J Am Coll Radiol. 2015 Feb;12(2): 143-50). Upper chest:  Visualized upper chest demonstrates no acute finding. Review of the MIP images confirms the above findings CTA HEAD FINDINGS Anterior circulation: Both internal carotid arteries are patent to the termini without stenosis. A1 segments patent bilaterally. Normal anterior communicating artery complex. Anterior cerebral arteries patent bilaterally. Left M1 segment widely patent. No proximal left MCA branch occlusion or high-grade stenosis. Distal left MCA branches well perfused. On the right, there is acute occlusion of the mid and distal right M1 segment, corresponding with hyperdense vessel on prior noncontrast CT (series 7, image 98). Attenuated flow within the right MCA territory distally, likely collateral nature. Posterior circulation: V4 segments diminutive but widely patent without stenosis. Both PICA patent. Basilar fenestrated and markedly diminutive but patent without stenosis. Superior cerebral arteries patent bilaterally. Fetal type origin of the PCAs, both of which are widely patent to their distal aspects. Venous sinuses: Patent allowing for timing the contrast bolus.  Anatomic variants: Fetal type origin of the PCAs with overall diminutive vertebrobasilar system. No aneurysm. Review of the MIP images confirms the above findings IMPRESSION: 1. Positive CTA for emergent large vessel occlusion, with acute occlusion of the mid and distal right M1 segment. 2. Transverse filling defect involving the proximal cervical right ICA just distal to the bifurcation, most characteristic of a carotid web. Associated short-segment stenosis of up to 55% by NASCET criteria. 3. Fetal type origin of the PCAs with overall diminutive vertebrobasilar system. These results were communicated to Dr. BCurly Shoresat 10:42 pm on 04/09/2022 by text page via the [facility] system. Electronically Signed   By: BJeannine BogaM.D.   On: 04/09/2022 22:55   CT HEAD CODE STROKE WO CONTRAST  Result Date: 04/09/2022 CLINICAL DATA:  Code stroke. Initial evaluation for neuro deficit, stroke. EXAM: CT HEAD WITHOUT CONTRAST TECHNIQUE: Contiguous axial images  were obtained from the base of the skull through the vertex without intravenous contrast. RADIATION DOSE REDUCTION: This exam was performed according to the departmental dose-optimization program which includes automated exposure control, adjustment of the mA and/or kV according to patient size and/or use of iterative reconstruction technique. COMPARISON:  None Available. FINDINGS: Brain: Cerebral volume within normal limits. No acute intracranial hemorrhage. There is subtle loss of gray-white matter differentiation involving the right insula as well as the right caudate and lentiform nuclei, concerning for early acute right MCA territory infarct. Gray-white matter differentiation otherwise grossly maintained. No mass lesion or midline shift. No hydrocephalus or extra-axial fluid collection. Tiny lipoma noted near the quadrigeminal plate cistern. Vascular: Dense right M1 segment, concerning for thrombus in large vessel occlusion. Skull: Scalp soft tissues and  calvarium demonstrate no acute finding. Sinuses/Orbits: Right gaze preference noted. Paranasal sinuses are largely clear. No mastoid effusion. Other: None. ASPECTS [facility] Stroke Program Early CT Score) - Ganglionic level infarction (caudate, lentiform nuclei, internal capsule, insula, M1-M3 cortex): 4 - Supraganglionic infarction (M4-M6 cortex): 3 Total score (0-10 with 10 being normal): 7 IMPRESSION: 1. Dense right M1 segment, concerning for thrombus/large vessel occlusion. 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 No intracranial hemorrhage. 2. ASPECTS is 7. These results were communicated to Dr. Curly Shores at 10:11 pm on 04/09/2022 by text page via the [facility] messaging system. Electronically Signed   By: Jeannine Boga M.D.   On: 04/09/2022 22:16    Microbiology: Recent Results (from the past 240 hour(s))  Resp panel by RT-PCR (RSV, Flu A&B, Covid) Anterior Nasal Swab     Status: None   Collection Time: 04/23/22  5:52 AM   Specimen: Anterior Nasal Swab  Result Value Ref Range Status   SARS Coronavirus 2 by RT PCR NEGATIVE NEGATIVE Final    Comment: (NOTE) SARS-CoV-2 target nucleic acids are NOT DETECTED.  The SARS-CoV-2 RNA is generally detectable in upper respiratory specimens during the acute phase of infection. The lowest concentration of SARS-CoV-2 viral copies this assay can detect is 138 copies/mL. A negative result does not preclude SARS-Cov-2 infection and should not be used as the sole basis for treatment or other patient management decisions. A negative result may occur with  improper specimen collection/handling, submission of specimen other than nasopharyngeal swab, presence of viral mutation(s) within the areas targeted by this assay, and inadequate number of viral copies(<138 copies/mL). A negative result must be combined with clinical observations, patient history, and epidemiological information. The expected result is Negative.  Fact Sheet for Patients:   EntrepreneurPulse.com.au  Fact Sheet for Healthcare Providers:  IncredibleEmployment.be  This test is no t yet approved or cleared by the [place] FDA and  has been authorized for detection and/or diagnosis of SARS-CoV-2 by FDA under an Emergency Use Authorization (EUA). This EUA will remain  in effect (meaning this test can be used) for the duration of the COVID-19 declaration under Section 564(b)(1) of the Act, 21 U.S.C.section 360bbb-3(b)(1), unless the authorization is terminated  or revoked sooner.       Influenza A by PCR NEGATIVE NEGATIVE Final   Influenza B by PCR NEGATIVE NEGATIVE Final    Comment: (NOTE) The Xpert Xpress SARS-CoV-2/FLU/RSV plus assay is intended as an aid in the diagnosis of influenza from Nasopharyngeal swab specimens and should not be used as a sole basis for treatment. Nasal washings and aspirates are unacceptable for Xpert Xpress SARS-CoV-2/FLU/RSV testing.  Fact Sheet for Patients: EntrepreneurPulse.com.au  Fact Sheet for Healthcare Providers:  IncredibleEmployment.be  This test is not yet approved or cleared by the [place] and has been authorized for detection and/or diagnosis of SARS-CoV-2 by FDA under an Emergency Use Authorization (EUA). This EUA will remain in effect (meaning this test can be used) for the duration of the COVID-19 declaration under Section 564(b)(1) of the Act, 21 U.S.C. section 360bbb-3(b)(1), unless the authorization is terminated or revoked.     Resp Syncytial Virus by PCR NEGATIVE NEGATIVE Final    Comment: (NOTE) Fact Sheet for Patients: EntrepreneurPulse.com.au  Fact Sheet for Healthcare Providers: IncredibleEmployment.be  This test is not yet approved or cleared by the [place] FDA and has been authorized for detection and/or diagnosis of SARS-CoV-2 by FDA under an Emergency Use  Authorization (EUA). This EUA will remain in effect (meaning this test can be used) for the duration of the COVID-19 declaration under Section 564(b)(1) of the Act, 21 U.S.C. section 360bbb-3(b)(1), unless the authorization is terminated or revoked.  Performed at [facility], [address] [place], [place] [address]   Culture, blood (Routine X 2) w Reflex to ID Panel     Status: None (Preliminary result)   Collection Time: 04/23/22  7:50 AM   Specimen: BLOOD  Result Value Ref Range Status   Specimen Description BLOOD LEFT ANTECUBITAL  Final   Special Requests   Final    BOTTLES DRAWN AEROBIC AND ANAEROBIC Blood Culture adequate volume   Culture   Final    NO GROWTH 4 DAYS Performed at [facility], [address] [place], [place] [address]    Report Status PENDING  Incomplete  Culture, blood (Routine X 2) w Reflex to ID Panel     Status: None (Preliminary result)   Collection Time: 04/23/22  7:50 AM   Specimen: BLOOD  Result Value Ref Range Status   Specimen Description BLOOD RIGHT ANTECUBITAL  Final   Special Requests   Final    BOTTLES DRAWN AEROBIC AND ANAEROBIC Blood Culture adequate volume   Culture   Final    NO GROWTH 4 DAYS Performed at [facility], [address], [place], [place] [address]    Report Status PENDING  Incomplete  C Difficile Quick Screen w PCR reflex     Status: None   Collection Time: 04/23/22 10:30 AM   Specimen: STOOL  Result Value Ref Range Status   C Diff antigen NEGATIVE NEGATIVE Final   C Diff toxin NEGATIVE NEGATIVE Final   C Diff interpretation No C. difficile detected.  Final    Comment: Performed at [facility], [address] [place], [place] [address]  Gastrointestinal Panel by PCR , Stool     Status: Abnormal   Collection Time: 04/23/22 10:30 AM   Specimen: Stool  Result Value Ref Range Status   Campylobacter species NOT DETECTED NOT DETECTED Final   Plesimonas shigelloides NOT DETECTED  NOT DETECTED Final   Salmonella species NOT DETECTED NOT DETECTED Final   Yersinia enterocolitica NOT DETECTED NOT DETECTED Final   Vibrio species NOT DETECTED NOT DETECTED Final   Vibrio cholerae NOT DETECTED NOT DETECTED Final   Enteroaggregative E coli (EAEC) NOT DETECTED NOT DETECTED Final   Enteropathogenic E coli (EPEC) NOT DETECTED NOT DETECTED Final   Enterotoxigenic E coli (ETEC) NOT DETECTED NOT DETECTED Final   Shiga like toxin producing E coli (STEC) NOT DETECTED NOT DETECTED Final   Shigella/Enteroinvasive E coli (EIEC) NOT DETECTED NOT DETECTED Final   Cryptosporidium NOT DETECTED NOT  DETECTED Final   Cyclospora cayetanensis NOT DETECTED NOT DETECTED Final   Entamoeba histolytica NOT DETECTED NOT DETECTED Final   Giardia lamblia NOT DETECTED NOT DETECTED Final   Adenovirus F40/41 NOT DETECTED NOT DETECTED Final   Astrovirus NOT DETECTED NOT DETECTED Final   Norovirus GI/GII NOT DETECTED NOT DETECTED Final   Rotavirus A NOT DETECTED NOT DETECTED Final   Sapovirus (I, II, IV, and V) DETECTED (A) NOT DETECTED Final    Comment: Performed at [facility], [address] [place], [place] [address]     Labs: CBC: Recent Labs  Lab 04/23/22 0543 04/24/22 0555 04/25/22 0432 04/26/22 0510  WBC 9.0 4.8 5.8 7.1  NEUTROABS 6.8  --   --   --   HGB 13.7 9.7* 9.6* 10.0*  HCT 39.4 28.6* 28.0* 29.4*  MCV 91.8 95.0 95.2 94.5  PLT 353 236 240 [id number]   Basic Metabolic Panel: Recent Labs  Lab 04/23/22 0543 04/24/22 0551 04/24/22 0555 04/25/22 0432 04/26/22 0510  NA 131*  --  136 138 138  K 2.7*  --  3.0* 3.8 3.5  CL 88*  --  105 109 107  CO2 29  --  '26 24 26  '$ GLUCOSE 164*  --  98 88 90  BUN 18  --  '8 7 7  '$ CREATININE 1.55*  --  1.00 0.93 0.81  CALCIUM 9.6  --  8.2* 8.4* 8.7*  MG 2.0 1.9  --  1.6* 1.8  PHOS 3.8 2.6  --  2.9 3.4   Liver Function Tests: Recent Labs  Lab 04/23/22 0543  AST 34  ALT 14  ALKPHOS 109  BILITOT 0.8  PROT 8.4*  ALBUMIN 4.5    Recent Labs  Lab 04/23/22 0543  LIPASE 29   No results for input(s): "AMMONIA" in the last 168 hours. Cardiac Enzymes: No results for input(s): "CKTOTAL", "CKMB", "CKMBINDEX", "TROPONINI" in the last 168 hours. BNP (last 3 results) No results for input(s): "BNP" in the last 8760 hours. CBG: No results for input(s): "GLUCAP" in the last 168 hours.  Time spent: 35 minutes  Signed:  Val Riles  [facility] 04/26/2022  5:27 PM

## 2022-04-26 NOTE — TOC Transition Note (Signed)
Transition of Care [facility]) - CM/SW Discharge Note   Patient Details  Name: Patricia [name] MRN: [id number] Date of Birth: 14-Dec-1965  [facility] CM/SW Contact:  Gerilyn Pilgrim, LCSW Phone Number: 04/26/2022, 10:13 AM   Clinical Narrative:   Pt has orders to discharge today. Pt would like referral placed for [facility] outpatient PT. CSW submitted referral for MD signature. Pt would also like a RW. RW ordered to be sent to patients room through [place]. CSW signing off.           Patient Goals and CMS Choice      Discharge Placement                         Discharge Plan and Services Additional resources added to the After Visit Summary for                                       Social Determinants of Health (SDOH) Interventions SDOH Screenings   Food Insecurity: No Food Insecurity (04/23/2022)  Housing: Low Risk  (04/23/2022)  Transportation Needs: No Transportation Needs (04/23/2022)  Utilities: Not At Risk (04/23/2022)  Tobacco Use: Medium Risk (04/23/2022)     Readmission Risk Interventions     No data to display

## 2022-04-28 ENCOUNTER — Ambulatory Visit (HOSPITAL_COMMUNITY)
Admission: RE | Admit: 2022-04-28 | Discharge: 2022-04-28 | Disposition: A | Discharge status: Home / Self Care | Payer: 59 | Source: Ambulatory Visit | Attending: Radiology | Admitting: Radiology

## 2022-04-28 DIAGNOSIS — I63511 Cerebral infarction due to unspecified occlusion or stenosis of right middle cerebral artery: Secondary | ICD-10-CM

## 2022-04-28 LAB — CULTURE, BLOOD (ROUTINE X 2)
Culture: NO GROWTH
Culture: NO GROWTH
Special Requests: ADEQUATE
Special Requests: ADEQUATE

## 2022-04-30 HISTORY — PX: IR RADIOLOGIST EVAL & MGMT: IMG5224

## 2022-06-04 ENCOUNTER — Other Ambulatory Visit: Payer: Self-pay

## 2022-06-04 ENCOUNTER — Emergency Department
Admission: EM | Admit: 2022-06-04 | Discharge: 2022-06-04 | Disposition: A | Discharge status: Home / Self Care | Payer: 59 | Attending: Emergency Medicine | Admitting: Emergency Medicine

## 2022-06-04 DIAGNOSIS — L509 Urticaria, unspecified: Secondary | ICD-10-CM | POA: Insufficient documentation

## 2022-06-04 DIAGNOSIS — I10 Essential (primary) hypertension: Secondary | ICD-10-CM | POA: Diagnosis not present

## 2022-06-04 DIAGNOSIS — R21 Rash and other nonspecific skin eruption: Secondary | ICD-10-CM | POA: Diagnosis present

## 2022-06-04 DIAGNOSIS — J45909 Unspecified asthma, uncomplicated: Secondary | ICD-10-CM | POA: Diagnosis not present

## 2022-06-04 MED ORDER — FAMOTIDINE 20 MG PO TABS
20.0000 mg | ORAL_TABLET | Freq: Once | ORAL | Status: AC
  Administered 2022-06-04: 20 mg via ORAL
  Filled 2022-06-04: qty 1

## 2022-06-04 MED ORDER — PREDNISONE 50 MG PO TABS: ORAL_TABLET | ORAL | 0 refills | Status: DC

## 2022-06-04 MED ORDER — PREDNISONE 20 MG PO TABS
50.0000 mg | ORAL_TABLET | Freq: Once | ORAL | Status: AC
  Administered 2022-06-04: 50 mg via ORAL
  Filled 2022-06-04: qty 3

## 2022-06-04 MED ORDER — DIPHENHYDRAMINE HCL 25 MG PO CAPS
25.0000 mg | ORAL_CAPSULE | Freq: Once | ORAL | Status: AC
  Administered 2022-06-04: 25 mg via ORAL
  Filled 2022-06-04: qty 1

## 2022-06-04 MED ORDER — DIPHENHYDRAMINE HCL 25 MG PO CAPS: 25.0000 mg | ORAL_CAPSULE | Freq: Four times a day (QID) | ORAL | 0 refills | Status: AC | PRN

## 2022-06-04 MED ORDER — FAMOTIDINE 20 MG PO TABS: 20.0000 mg | ORAL_TABLET | Freq: Two times a day (BID) | ORAL | 0 refills | Status: DC

## 2022-06-04 NOTE — Discharge Instructions (Signed)
Take medications as prescribed.  Member that the Benadryl will make you sleepy, so do not drive, operate heavy machinery, or perform any test that require concentration while taking this medication.  Please return for any new, worsening, or change in symptoms or other concerns including trouble breathing, swallowing, nausea, vomiting, diarrhea, abdominal pain, or tongue or lip swelling or if you develop blistering or involvement of your oral or vaginal mucosa, or any other concerns.  Please follow-up with your outpatient provider for allergy testing.  It was a pleasure caring for you today.

## 2022-06-04 NOTE — ED Triage Notes (Addendum)
Pt c/o itchy rash on hands arms and trunk that started yesterday. Pt states she last ate chicken rice and tea which she is not allergic to, also noted she had her nails done and was nicked. Pt Aox4, NAD noted. Red flat rash noted. Pt denies SHOB, is speaking full sentences. Pt states she using hydrocortisone cream with some relief.

## 2022-06-04 NOTE — ED Provider Notes (Signed)
[facility] Provider Note    Event Date/Time   First MD Initiated Contact with Patient 06/04/22 1222     (approximate)   History   Rash   HPI  Patricia [name] is a 57 y.o. female with a past medical history of hypertension, asthma, hyperlipidemia who presents today for evaluation of a rash that began yesterday.  Patient reports that this began after eating at an [employer] restaurant.  She is unsure which additives or in her food.  She denies any known food allergies.  She reports that the rash began on her right forearm but is since spread to her bilateral forearms and torso.  She reports that the rash is very itchy.  She has not noticed any blistering or vesicles.  She has not noticed any mucosal lesions.  She denies any new medications.  She denies any tongue or lip swelling, shortness of breath, abdominal pain, nausea, vomiting, or diarrhea.  She has not taken anything for her symptoms.  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 --      Excl. in GC? --     Most recent vital signs: Vitals:   06/04/22 1210 06/04/22 1333  BP: (!) 155/98 (!) 141/97  Pulse:  (!) 119 97  Resp: 18 16  Temp: 98.4 F (36.9 C)   SpO2: 99% 100%    Physical Exam Vitals and nursing note reviewed.  Constitutional:      General: Awake and alert. No acute distress.    Appearance: Normal appearance. The patient is normal weight.  HENT:     Head: Normocephalic and atraumatic.     Mouth: Mucous membranes are moist.  Eyes:     General: PERRL. Normal EOMs        Right eye: No discharge.        Left eye: No discharge.     Conjunctiva/sclera: Conjunctivae normal.  Cardiovascular:     Rate and Rhythm: Normal rate and regular rhythm.     Pulses: Normal pulses.     Heart sounds: Normal heart sounds Pulmonary:     Effort: Pulmonary effort is normal. No respiratory distress.     Breath sounds: Normal breath sounds.  Abdominal:     Abdomen is soft. There is no abdominal tenderness. No rebound or guarding. No distention. Musculoskeletal:  General: No swelling. Normal range of motion.     Cervical back: Normal range of motion and neck supple.  Skin:    General: Skin is warm and dry.     Capillary Refill: Capillary refill takes less than 2 seconds.     Findings: Urticaria and wheals noted to bilateral extensor surfaces of forearms, as well as to her bilateral flanks.  No involvement of her mucosa.  No vesicles, skin sloughing, bullae, or open wounds noted.  No involvement of her palms or soles.  No facial involvement. Neurological:     Mental Status: The patient is awake and alert.      ED Results / Procedures / Treatments   Labs (all labs ordered are listed, but only abnormal results are displayed) Labs Reviewed - No data to display   EKG     RADIOLOGY     PROCEDURES:  Critical Care performed:   Procedures   MEDICATIONS ORDERED IN ED: Medications  diphenhydrAMINE (BENADRYL) capsule 25 mg (25 mg Oral Given 06/04/22 1238)  famotidine (PEPCID) tablet 20 mg (20 mg Oral Given 06/04/22 1238)  predniSONE (DELTASONE) tablet 50 mg (50 mg Oral Given  06/04/22 1239)     IMPRESSION / MDM / ASSESSMENT AND PLAN / ED COURSE  I reviewed the triage vital signs and the nursing notes.   Differential diagnosis includes, but is not limited to, urticaria, allergic reaction, contact dermatitis.  No tongue or lip swelling to suggest angioedema.  No dysphagia, shortness of breath, nausea, vomit, diarrhea, or abdominal pain to suggest anaphylaxis.  Patient is awake and alert, hemodynamically stable, and nontoxic in appearance.  Symptoms been ongoing for 2 days.  She has obvious urticaria and wheals to her extensor surfaces of her arms as well as her torso.  She was treated with H1 and H2 blockers as well as steroids with significant improvement of her symptoms.  No wheezes on exam.  There are no mucosal lesions or mucosal involvement, no bullae or skin sloughing to suggest SJS or TENS.  No recent medication change.  Her only new exposure that she can think of is potential additives in the food and [employer] restaurant.  She has no known allergies to any foods.  She reported significant relief of her symptoms after symptomatic management.  Prednisone burst was continued for the next few days.  We discussed return precautions and close outpatient follow-up for reevaluation and allergy testing.  Patient understands and agrees with plan.  She was discharged in the care of her family member who was the driver given that she was given Benadryl.  Patient ambulate member understand and agree with plan.  She was discharged in stable condition.   Patient's presentation is most consistent with acute complicated illness / injury requiring diagnostic workup.     FINAL CLINICAL IMPRESSION(S) / ED DIAGNOSES   Final diagnoses:  Urticaria     Rx / DC Orders   ED Discharge Orders          Ordered    predniSONE (DELTASONE) 50 MG tablet        06/04/22 1327    famotidine (PEPCID) 20 MG tablet  2 times daily        06/04/22 1327    diphenhydrAMINE (BENADRYL ALLERGY) 25 mg  capsule  Every 6 hours PRN        06/04/22 1327             Note:  This document was prepared using Dragon voice recognition software  and may include unintentional dictation errors.   Jackelyn Hoehn, PA-C 06/04/22 1530    Merwyn Katos, MD 06/04/22 1910

## 2022-06-15 ENCOUNTER — Encounter: Payer: Self-pay | Admitting: Neurology

## 2022-06-15 ENCOUNTER — Ambulatory Visit (INDEPENDENT_AMBULATORY_CARE_PROVIDER_SITE_OTHER): Payer: 59 | Admitting: Neurology

## 2022-06-15 VITALS — BP 140/99 | HR 91 | Ht 65.0 in | Wt 168.0 lb

## 2022-06-15 DIAGNOSIS — G40009 Localization-related (focal) (partial) idiopathic epilepsy and epileptic syndromes with seizures of localized onset, not intractable, without status epilepticus: Secondary | ICD-10-CM | POA: Diagnosis not present

## 2022-06-15 DIAGNOSIS — G8194 Hemiplegia, unspecified affecting left nondominant side: Secondary | ICD-10-CM | POA: Diagnosis not present

## 2022-06-15 DIAGNOSIS — I63411 Cerebral infarction due to embolism of right middle cerebral artery: Secondary | ICD-10-CM | POA: Diagnosis not present

## 2022-06-15 NOTE — Patient Instructions (Signed)
I had a long d/w patient about his recent stroke, risk for recurrent stroke/TIAs, personally independently reviewed imaging studies and stroke evaluation results and answered questions.Continue aspirin 81 mg daily and Brilinta (ticagrelor) 90 mg bid  for 6 months post carotid stent and then stop Brilinta and stay on aspirin alone secondary stroke prevention and maintain strict control of hypertension with blood pressure goal below 130/90, diabetes with hemoglobin A1c goal below 6.5% and lipids with LDL cholesterol goal below 70 mg/dL. I also advised the patient to eat a healthy diet with plenty of whole grains, cereals, fruits and vegetables, exercise regularly and maintain ideal body weight I advised the patient to quit using marijuana completely smoke cigarettes.  Followup in the future with my nurse practitioner in 6 months or call earlier if necessary.  Stroke Prevention Some medical conditions and behaviors can lead to a higher chance of having a stroke. You can help prevent a stroke by eating healthy, exercising, not smoking, and managing any medical conditions you have. Stroke is a leading cause of functional impairment. Primary prevention is particularly important because a majority of strokes are first-time events. Stroke changes the lives of not only those who experience a stroke but also their family and other caregivers. How can this condition affect me? A stroke is a medical emergency and should be treated right away. A stroke can lead to brain damage and can sometimes be life-threatening. If a person gets medical treatment right away, there is a better chance of surviving and recovering from a stroke. What can increase my risk? The following medical conditions may increase your risk of a stroke: Cardiovascular disease. High blood pressure (hypertension). Diabetes. High cholesterol. Sickle cell disease. Blood clotting disorders (hypercoagulable state). Obesity. Sleep disorders  (obstructive sleep apnea). Other risk factors include: Being older than age 69. Having a history of blood clots, stroke, or mini-stroke (transient ischemic attack, TIA). Genetic factors, such as race, ethnicity, or a family history of stroke. Smoking cigarettes or using other tobacco products. Taking birth control pills, especially if you also use tobacco. Heavy use of alcohol or drugs, especially cocaine and methamphetamine. Physical inactivity. What actions can I take to prevent this? Manage your health conditions High cholesterol levels. Eating a healthy diet is important for preventing high cholesterol. If cholesterol cannot be managed through diet alone, you may need to take medicines. Take any prescribed medicines to control your cholesterol as told by your health care provider. Hypertension. To reduce your risk of stroke, try to keep your blood pressure below 130/80. Eating a healthy diet and exercising regularly are important for controlling blood pressure. If these steps are not enough to manage your blood pressure, you may need to take medicines. Take any prescribed medicines to control hypertension as told by your health care provider. Ask your health care provider if you should monitor your blood pressure at home. Have your blood pressure checked every year, even if your blood pressure is normal. Blood pressure increases with age and some medical conditions. Diabetes. Eating a healthy diet and exercising regularly are important parts of managing your blood sugar (glucose). If your blood sugar cannot be managed through diet and exercise, you may need to take medicines. Take any prescribed medicines to control your diabetes as told by your health care provider. Get evaluated for obstructive sleep apnea. Talk to your health care provider about getting a sleep evaluation if you snore a lot or have excessive sleepiness. Make sure that any other medical conditions you  have, such as  atrial fibrillation or atherosclerosis, are managed. Nutrition Follow instructions from your health care provider about what to eat or drink to help manage your health condition. These instructions may include: Reducing your daily calorie intake. Limiting how much salt (sodium) you use to 1,500 milligrams (mg) each day. Using only healthy fats for cooking, such as olive oil, canola oil, or sunflower oil. Eating healthy foods. You can do this by: Choosing foods that are high in fiber, such as whole grains, and fresh fruits and vegetables. Eating at least 5 servings of fruits and vegetables a day. Try to fill one-half of your plate with fruits and vegetables at each meal. Choosing lean protein foods, such as lean cuts of meat, poultry without skin, fish, tofu, beans, and nuts. Eating low-fat dairy products. Avoiding foods that are high in sodium. This can help lower blood pressure. Avoiding foods that have saturated fat, trans fat, and cholesterol. This can help prevent high cholesterol. Avoiding processed and prepared foods. Counting your daily carbohydrate intake.  Lifestyle If you drink alcohol: Limit how much you have to: 0-1 drink a day for women who are not pregnant. 0-2 drinks a day for men. Know how much alcohol is in your drink. In the [place] one drink equals one 12 oz bottle of beer (331m), one 5 oz glass of wine (1482m, or one 1 oz glass of hard liquor (4473m Do not use any products that contain nicotine or tobacco. These products include cigarettes, chewing tobacco, and vaping devices, such as e-cigarettes. If you need help quitting, ask your health care provider. Avoid secondhand smoke. Do not use drugs. Activity  Try to stay at a healthy weight. Get at least 30 minutes of exercise on most days, such as: Fast walking. Biking. Swimming. Medicines Take over-the-counter and prescription medicines only as told by your health care provider. Aspirin or blood thinners  (antiplatelets or anticoagulants) may be recommended to reduce your risk of forming blood clots that can lead to stroke. Avoid taking birth control pills. Talk to your health care provider about the risks of taking birth control pills if: You are over 35 41ars old. You smoke. You get very bad headaches. You have had a blood clot. Where to find more information American Stroke Association: www.strokeassociation.org Get help right away if: You or a loved one has any symptoms of a stroke. "BE FAST" is an easy way to remember the main warning signs of a stroke: B - Balance. Signs are dizziness, sudden trouble walking, or loss of balance. E - Eyes. Signs are trouble seeing or a sudden change in vision. F - Face. Signs are sudden weakness or numbness of the face, or the face or eyelid drooping on one side. A - Arms. Signs are weakness or numbness in an arm. This happens suddenly and usually on one side of the body. S - Speech. Signs are sudden trouble speaking, slurred speech, or trouble understanding what people say. T - Time. Time to call emergency services. Write down what time symptoms started. You or a loved one has other signs of a stroke, such as: A sudden, severe headache with no known cause. Nausea or vomiting. Seizure. These symptoms may represent a serious problem that is an emergency. Do not wait to see if the symptoms will go away. Get medical help right away. Call your local emergency services (911 in the [place] Do not drive yourself to the hospital. Summary You can help to prevent a stroke by eating  healthy, exercising, not smoking, limiting alcohol intake, and managing any medical conditions you may have. Do not use any products that contain nicotine or tobacco. These include cigarettes, chewing tobacco, and vaping devices, such as e-cigarettes. If you need help quitting, ask your health care provider. Remember "BE FAST" for warning signs of a stroke. Get help right away if you or a  loved one has any of these signs. This information is not intended to replace advice given to you by your health care provider. Make sure you discuss any questions you have with your health care provider. Document Revised: 08/29/2019 Document Reviewed: 09/16/2019 Elsevier Patient Education  2023 [employer].

## 2022-06-15 NOTE — Progress Notes (Signed)
[facility] [address] [place]. [place] [address] [phone]       OFFICE FOLLOW-UP NOTE  [name] Date of Birth:  [birth year] Medical Record Number:  [id number]   HPI: 57 year old lady seen today for initial office visit following hospital admission for stroke in February 2024.  History is obtained from the patient and review of electronic medical records and I personally reviewed pertinent available imaging films in PACS.  He has past medical history of hypertension, hyperlipidemia, asthma and bronchitis.  He presented on 04/09/2022 with sudden onset of left arm numbness.  EMS activated a code stroke.  On admission she had a manage stroke scale of 14 with right gaze deviation and left hemiplegia.  Received IV TNK.  CT head showed dense right M1 sign and CT angiogram showed acute occlusion of the distal right M1 with transient filling defect at the ICA just distal to the bifurcation suggestive of carotid web.  She underwent emergent mechanical thrombectomy with TICI 3 revascularization of the right M1 and placement of rescue right proximal ICA stent.  The carotid vessels.  She did very well and was extubated and made gradual improvement.  2D echo showed normal ejection fraction of 60 to 65%.  LDL cholesterol was 81 mg percent.  Hemoglobin A1c was 5.1.  Urine drug screen was positive for marijuana.  Patient had a small right groin pseudoaneurysm with resolved left manual compression.  She was placed on aspirin and Brilinta and Crestor.  She was counseled to quit smoking cigarettes and use marijuana.  Patient states is done very well and almost full neurological recovery without recurrent stroke or TIA symptoms.  However she was seen in the ER on 04/26/2022 by Dr. Amada [name] and treated the left episode of becoming flushed with sweating and lightheadedness with brief loss of consciousness with stiffening and shaking of upper extremities lasting 15 seconds.  This happened  after she had developed a gastrointestinal illness.  She had 2 such episodes while she was in a seated position.  There was no tongue bite or incontinence.  EEG was obtained which showed right posterior sharp waves with phase reversal.  He was started on Keppra for seizure prophylaxis.  He has had no further such episodes since then.  He states he is able to resume all activities of daily living.  Occasionally she feels that the left leg drags when she is tired.  She also gets some occasional cramps in the left hand.  She has no new complaints today.  ROS:   14 system review of systems is positive for numbness, leg cramps, brief loss of consciousness, stiffness bruising all other systems negative  PMH:  Past Medical History:  Diagnosis Date   Asthma    Bronchitis    HLD (hyperlipidemia)    Hypertension    Stroke     Social History:  Social History   Socioeconomic History   Marital status: Divorced    Spouse name: Not on file   Number of children: Not on file   Years of education: Not on file   Highest education level: Not on file  Occupational History   Not on file  Tobacco Use   Smoking status: Former    Types: Cigars    Quit date: 02/11/2021    Years since quitting: 1.3   Smokeless tobacco: Never  Substance and Sexual Activity   Alcohol use: Yes    Comment: occassionaly   Drug use: Yes    Types:  Marijuana   Sexual activity: Not on file  Other Topics Concern   Not on file  Social History Narrative   Not on file   Social Determinants of Health   Financial Resource Strain: Not on file  Food Insecurity: No Food Insecurity (04/23/2022)   Hunger Vital Sign    Worried About Running Out of Food in the Last Year: Never true    Ran Out of Food in the Last Year: Never true  Transportation Needs: No Transportation Needs (04/23/2022)   PRAPARE - Administrator, Civil Service (Medical): No    Lack of Transportation (Non-Medical): No  Physical Activity: Not on file   Stress: Not on file  Social Connections: Not on file  Intimate Partner Violence: Not At Risk (04/23/2022)   Humiliation, Afraid, Rape, and Kick questionnaire    Fear of Current or Ex-Partner: No    Emotionally Abused: No    Physically Abused: No    Sexually Abused: No    Medications:   Current Outpatient Medications on File Prior to Visit  Medication Sig Dispense Refill   albuterol (PROVENTIL HFA;VENTOLIN HFA) 108 (90 Base) MCG/ACT inhaler Inhale 2 puffs into the lungs every 6 (six) hours as needed for wheezing or shortness of breath. 1 Inhaler 2   aspirin 81 MG chewable tablet Chew 1 tablet (81 mg total) by mouth daily. 30 tablet 2   citalopram (CELEXA) 20 MG tablet Take 20 mg by mouth at bedtime.     cyanocobalamin 1000 MCG tablet Take 1 tablet (1,000 mcg total) by mouth daily. 90 tablet 0   cyclobenzaprine (FLEXERIL) 10 MG tablet Take 10 mg by mouth 3 (three) times daily.     hydrochlorothiazide (HYDRODIURIL) 25 MG tablet Take 25 mg by mouth daily.     levETIRAcetam (KEPPRA) 500 MG tablet Take 1 tablet (500 mg total) by mouth 2 (two) times daily. 180 tablet 1   NIFEdipine (ADALAT CC) 30 MG 24 hr tablet Take 30 mg by mouth daily.     ondansetron (ZOFRAN) 4 MG tablet Take 4 mg by mouth every 8 (eight) hours as needed for nausea.     Potassium Chloride ER 20 MEQ TBCR Take 1 tablet by mouth daily.     rosuvastatin (CRESTOR) 20 MG tablet Take 1 tablet (20 mg total) by mouth daily. 30 tablet 2   ticagrelor (BRILINTA) 90 MG TABS tablet Take 1 tablet (90 mg total) by mouth 2 (two) times daily. 60 tablet 2   Vitamin D, Ergocalciferol, (DRISDOL) 1.25 MG (50000 UNIT) CAPS capsule Take 1 capsule (50,000 Units total) by mouth every 7 (seven) days. 12 capsule 0   diphenhydrAMINE (BENADRYL ALLERGY) 25 mg capsule Take 1 capsule (25 mg total) by mouth every 6 (six) hours as needed for up to 5 days. 10 capsule 0   No current facility-administered medications on file prior to visit.    Allergies:    Allergies  Allergen Reactions   Tramadol Rash    Physical Exam General: well developed, well nourished pleasant middle-aged lady, seated, in no evident distress Head: head normocephalic and atraumatic.  Neck: supple with no carotid or supraclavicular bruits Cardiovascular: regular rate and rhythm, no murmurs Musculoskeletal: no deformity Skin:  no rash/petichiae Vascular:  Normal pulses all extremities Vitals:   06/15/22 0930  BP: (!) 140/99  Pulse: 91   Neurologic Exam Mental Status: Awake and fully alert. Oriented to place and time. Recent and remote memory intact. Attention span, concentration and fund of knowledge  appropriate. Mood and affect appropriate.  Cranial Nerves: Fundoscopic exam reveals sharp disc margins. Pupils equal, briskly reactive to light. Extraocular movements full without nystagmus. Visual fields full to confrontation. Hearing intact. Facial sensation intact. Face, tongue, palate moves normally and symmetrically.  Motor: Normal bulk and tone. Normal strength in all tested extremity muscles.  Diminished fine finger movements on the left.  Orbits right over left upper extremity. Sensory.: intact to touch ,pinprick .position and vibratory sensation.  Coordination: Rapid alternating movements normal in all extremities. Finger-to-nose and heel-to-shin performed accurately bilaterally. Gait and Station: Arises from chair without difficulty. Stance is normal. Gait demonstrates normal stride length and balance . Able to heel, toe and tandem walk without difficulty.  Reflexes: 1+ and symmetric. Toes downgoing.   NIHSS  0 Modified Rankin  1   ASSESSMENT: 57 year old lady with right middle cerebral artery infarct in February 2024 due to right M1 occlusion.  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  Vascular risk factors of mild hyperlipidemia,  hypertension and carotid web 2 episodes of brief altered consciousness possibly syncopal episodes versus complex partial seizures on 04/26/2022    PLAN:I had a long d/w patient about his recent stroke, risk for recurrent stroke/TIAs, personally independently reviewed imaging studies and stroke evaluation results and answered questions.Continue aspirin 81 mg daily and Brilinta (ticagrelor) 90 mg bid  for 6 months post carotid stent and then stop Brilinta and stay on aspirin alone secondary stroke prevention and maintain strict control of hypertension with blood pressure goal below 130/90, diabetes with hemoglobin A1c goal below 6.5% and lipids with LDL cholesterol goal below 70 mg/dL. I also advised the patient to eat a healthy diet with plenty of whole grains, cereals, fruits and vegetables, exercise regularly and maintain ideal body weight I advised the patient to quit using marijuana completely  and  not to smoke cigarettes.  Recommend repeat EEG and if it is normal mild may consider tapering and discontinuing Keppra.  Followup in the future with my nurse practitioner in 6 months or call earlier if necessary. Greater than 50% of time during this 35 minute visit was spent on counseling,explanation of diagnosis, planning of further management, discussion with patient and family and coordination of care Delia Heady, MD Note: This document was prepared with digital dictation and possible smart phrase technology. Any transcriptional errors that result from this process are unintentional

## 2022-06-28 ENCOUNTER — Ambulatory Visit (INDEPENDENT_AMBULATORY_CARE_PROVIDER_SITE_OTHER): Payer: 59 | Admitting: Neurology

## 2022-06-28 DIAGNOSIS — G40009 Localization-related (focal) (partial) idiopathic epilepsy and epileptic syndromes with seizures of localized onset, not intractable, without status epilepticus: Secondary | ICD-10-CM

## 2022-07-01 ENCOUNTER — Other Ambulatory Visit: Payer: Self-pay

## 2022-07-01 DIAGNOSIS — I10 Essential (primary) hypertension: Secondary | ICD-10-CM | POA: Diagnosis not present

## 2022-07-01 DIAGNOSIS — Z8673 Personal history of transient ischemic attack (TIA), and cerebral infarction without residual deficits: Secondary | ICD-10-CM | POA: Insufficient documentation

## 2022-07-01 DIAGNOSIS — E876 Hypokalemia: Secondary | ICD-10-CM | POA: Insufficient documentation

## 2022-07-01 DIAGNOSIS — R569 Unspecified convulsions: Secondary | ICD-10-CM | POA: Diagnosis present

## 2022-07-01 DIAGNOSIS — R300 Dysuria: Secondary | ICD-10-CM | POA: Diagnosis not present

## 2022-07-01 LAB — CBC
HCT: 40.4 % (ref 36.0–46.0)
Hemoglobin: 13.6 g/dL (ref 12.0–15.0)
MCH: 32 pg (ref 26.0–34.0)
MCHC: 33.7 g/dL (ref 30.0–36.0)
MCV: 95.1 fL (ref 80.0–100.0)
Platelets: 252 10*3/uL (ref 150–400)
RBC: 4.25 MIL/uL (ref 3.87–5.11)
RDW: 12.1 % (ref 11.5–15.5)
WBC: 10.2 10*3/uL (ref 4.0–10.5)
nRBC: 0 % (ref 0.0–0.2)

## 2022-07-01 LAB — CBG MONITORING, ED: Glucose-Capillary: 138 mg/dL — ABNORMAL HIGH (ref 70–99)

## 2022-07-01 MED ORDER — LORAZEPAM 1 MG PO TABS
1.0000 mg | ORAL_TABLET | Freq: Once | ORAL | Status: AC
  Administered 2022-07-01: 1 mg via ORAL
  Filled 2022-07-01: qty 1

## 2022-07-01 NOTE — Progress Notes (Signed)
Kindly inform the patient that brainwave study or EEG was normal.  No seizure activity noted

## 2022-07-01 NOTE — ED Triage Notes (Signed)
Pt and family report witnessed seizure at home that lasted ~5-7 minutes. 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 Reports that pt had a stroke in February and that this is her 3rd seizure since then. Pt reports compliant with seizure medications at home. Pt arrives alert and oriented. Does not appear post ictal in triage. Pt is following commands and speaking in full sentences.

## 2022-07-02 ENCOUNTER — Emergency Department
Admission: EM | Admit: 2022-07-02 | Discharge: 2022-07-02 | Disposition: A | Discharge status: Home / Self Care | Payer: 59 | Attending: Emergency Medicine | Admitting: Emergency Medicine

## 2022-07-02 ENCOUNTER — Other Ambulatory Visit: Payer: Self-pay

## 2022-07-02 ENCOUNTER — Emergency Department: Payer: 59

## 2022-07-02 DIAGNOSIS — R569 Unspecified convulsions: Secondary | ICD-10-CM

## 2022-07-02 DIAGNOSIS — E876 Hypokalemia: Secondary | ICD-10-CM

## 2022-07-02 LAB — BASIC METABOLIC PANEL
Anion gap: 11 (ref 5–15)
BUN: 9 mg/dL (ref 6–20)
CO2: 23 mmol/L (ref 22–32)
Calcium: 9.3 mg/dL (ref 8.9–10.3)
Chloride: 101 mmol/L (ref 98–111)
Creatinine, Ser: 0.94 mg/dL (ref 0.44–1.00)
GFR, Estimated: >60 mL/min (ref >60)
Glucose, Bld: 140 mg/dL — ABNORMAL HIGH (ref 70–99)
Potassium: 3.2 mmol/L — ABNORMAL LOW (ref 3.5–5.1)
Sodium: 135 mmol/L (ref 135–145)

## 2022-07-02 LAB — URINALYSIS, ROUTINE W REFLEX MICROSCOPIC
Bilirubin Urine: NEGATIVE
Glucose, UA: NEGATIVE mg/dL
Hgb urine dipstick: NEGATIVE
Ketones, ur: NEGATIVE mg/dL
Leukocytes,Ua: NEGATIVE
Nitrite: NEGATIVE
Protein, ur: NEGATIVE mg/dL
Specific Gravity, Urine: 1.021 (ref 1.005–1.030)
pH: 5 (ref 5.0–8.0)

## 2022-07-02 LAB — TROPONIN I (HIGH SENSITIVITY): Troponin I (High Sensitivity): 4 ng/L (ref <18)

## 2022-07-02 MED ORDER — LEVETIRACETAM 500 MG PO TABS
500.0000 mg | ORAL_TABLET | Freq: Once | ORAL | Status: AC
  Administered 2022-07-02: 500 mg via ORAL
  Filled 2022-07-02: qty 1

## 2022-07-02 NOTE — ED Provider Notes (Signed)
[facility] Provider Note    Event Date/Time   First MD Initiated Contact with Patient 07/02/22 0205     (approximate)   History   Seizures   HPI  Patricia [name] is a 57 y.o. female   Past medical history of recent CVA, seizures, hypertension and hyperlipidemia who presents emergency department with a seizure today.  Witnessed by family lasting several minutes generalized tonic-clonic activity and postictal period.  She felt a sensation of warmth prior to this seizure which is typical of her seizures ever since having her stroke.  She has been compliant with her Keppra.  She has had some mild dysuria.  She has otherwise been in her regular state of health no other acute medical complaints.  At this time she is awake alert oriented with no complaints.    External Medical Documents Reviewed: Neurologist note from 06/15/2022 documenting her CVA history and seizures medication      Physical Exam   Triage Vital Signs: ED Triage Vitals  Enc Vitals Group     BP 07/01/22 2338 (!) 149/106     Pulse Rate 07/01/22 2338 (!) 105     Resp 07/01/22 2338 (!) 21     Temp 07/01/22 2338 98.2 F (36.8 C)     Temp Source 07/01/22 2338 Oral     SpO2 07/01/22 2338 100 %     Weight 07/01/22 2342 170 lb (77.1 kg)     Height 07/01/22 2342 5\' 5"  (1.651 m)     Head Circumference --      Peak Flow --      Pain Score 07/01/22 2342 3     Pain Loc --      Pain Edu? --      Excl. in GC? --     Most recent vital signs: Vitals:   07/01/22 2338  BP: (!) 149/106  Pulse: (!) 105  Resp: (!) 21  Temp: 98.2 F (36.8 C)  SpO2: 100%    General: Awake, no distress.  CV:  Good peripheral perfusion.  Resp:  Normal effort.  Abd:  No distention.  Other:  Awake alert comfortable.   ED Results / Procedures / Treatments   Labs (all labs ordered are listed, but only abnormal results are displayed) Labs Reviewed  BASIC METABOLIC PANEL - Abnormal; Notable for the  following components:      Result Value   Potassium 3.2 (*)    Glucose, Bld 140 (*)    All other components within normal limits  URINALYSIS, ROUTINE W REFLEX MICROSCOPIC - Abnormal; Notable for the following components:   Color, Urine YELLOW (*)    APPearance HAZY (*)    All other components within normal limits  CBG MONITORING, ED - Abnormal; Notable for the following components:   Glucose-Capillary 138 (*)    All other components within normal limits  CBC  LEVETIRACETAM LEVEL  CBG MONITORING, ED  TROPONIN I (HIGH SENSITIVITY)     I ordered and reviewed the above labs they are notable for glucose is 140 and no bacteria or leukocytes in the urine, electrolytes otherwise within normal limits with a slightly low potassium  EKG  ED ECG REPORT I, Pilar Jarvis, the attending physician, personally viewed and interpreted this ECG.   Date: 07/02/2022  EKG Time: 0226  Rate: 72  Rhythm: nsr  Axis: nl  Intervals:none  ST&T Change: no stemi    RADIOLOGY I independently reviewed and interpreted CT scan of the head  and see no obvious bleeding or midline shift   PROCEDURES:  Critical Care performed: No  Procedures   MEDICATIONS ORDERED IN ED: Medications  LORazepam (ATIVAN) tablet 1 mg (1 mg Oral Given 07/01/22 2359)  levETIRAcetam (KEPPRA) tablet 500 mg (500 mg Oral Given 07/02/22 0331)   IMPRESSION / MDM / ASSESSMENT AND PLAN / ED COURSE  I reviewed the triage vital signs and the nursing notes.                                Patient's presentation is most consistent with acute presentation with potential threat to life or bodily function.  Differential diagnosis includes, but is not limited to, seizure, intracranial bleeding, CVA, electrolyte derangement, dysrhythmia, infection   The patient is on the cardiac monitor to evaluate for evidence of arrhythmia and/or significant heart rate changes.  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  She is back to baseline.  Labs largely unremarkable, EKG is normal sinus rhythm, CT head shows no intracranial abnormalities and she has established care with a neurologist.  She had some mild dysuria but her urinalysis shows no signs of infection.  Since she is back to baseline with unremarkable workup as above, I think that she can follow-up as an outpatient with her neurologist this week.  Her Keppra level is pending, she will review this with her neurologist.       FINAL CLINICAL IMPRESSION(S) / ED DIAGNOSES   Final diagnoses:  Seizure (HCC)  Hypokalemia     Rx / DC Orders   ED Discharge Orders     None        Note:  This document was prepared using Dragon voice recognition software and may include unintentional dictation errors.    Pilar Jarvis, MD 07/02/22 [phone]

## 2022-07-02 NOTE — Discharge Instructions (Addendum)
Call Dr. Pearlean Brownie for a follow-up with your neurologist to discuss seizures and seizure medications.  Your potassium level was slightly low, see the attached document to make dietary changes to increase your intake of potassium and have your primary doctor recheck these levels in 1 to 2 weeks.

## 2022-07-04 ENCOUNTER — Telehealth: Payer: Self-pay

## 2022-07-04 LAB — LEVETIRACETAM LEVEL: Levetiracetam Lvl: 13.3 ug/mL (ref 10.0–40.0)

## 2022-07-04 NOTE — Telephone Encounter (Signed)
I informed the patient of the results of the EEG. She verbalized understanding and expressed appreciation for the call.  She was admitted to the hospital on 07/02/2022 for a generalized tonic-clonic seizure that lasted several minutes. She was treated with lorazepam 1 mg.  She would like to know if the EEG should be repeated or if a sooner follow up is necessary.

## 2022-07-04 NOTE — Telephone Encounter (Signed)
-----   Message from Deatra James, RN sent at 07/04/2022  9:34 AM EDT -----  ----- Message ----- From: Micki [name], MD Sent: 07/01/2022   8:27 AM EDT To: Gna-Pod 2 Results  Kindly inform the patient that brainwave study or EEG was normal.  No seizure activity noted

## 2022-07-05 ENCOUNTER — Other Ambulatory Visit: Payer: Self-pay | Admitting: Neurology

## 2022-07-05 NOTE — Telephone Encounter (Signed)
I spoke with the patient and informed her a repeat EEG is not needed at this time. She will keep her current follow up and call back for any further seizure activity.

## 2022-07-06 NOTE — Telephone Encounter (Signed)
Dr. Pearlean Brownie,  It looks like you have not been filling this medication: levETIRAcetam Adherence  Dispenses   Dispensed Days Supply Quantity Provider Pharmacy  LEVETIRACETA 500MG  TAB 07/05/2022 30 60 tablet Gillis Santa, MD [facility]...  LEVETIRACETAM 500 MG TABLET 05/30/2022 30 60 each Gillis Santa, MD [facility]...  LEVETIRACETAM 500 MG TABLET 04/26/2022 30 60 each Gillis Santa, MD [facility] [place].Marland KitchenMarland Kitchen

## 2022-07-19 ENCOUNTER — Other Ambulatory Visit: Payer: Self-pay

## 2022-07-19 NOTE — Patient Outreach (Signed)
No telephone outreach to patient to obtain mRS, as was successfully completed by Dr. Pearlean Brownie 06/28/22. MRS=1  Patricia [name] [facility] Care Management Assistant [phone]

## 2022-10-17 ENCOUNTER — Telehealth: Payer: Self-pay | Admitting: Neurology

## 2022-10-17 NOTE — Telephone Encounter (Signed)
At 10:25 pt left a vm asking that a letter be sent to her supervisor Kathryne Eriksson fax# [phone]) pt states the letter needs to state that she has had a stroke as to why she is out of work.  Pt stated this letter is needing to be faxed to her supervisor by tomorrow morning. Pt left her call back # as [phone]

## 2022-10-18 ENCOUNTER — Telehealth: Payer: Self-pay

## 2022-10-18 DIAGNOSIS — Z1211 Encounter for screening for malignant neoplasm of colon: Secondary | ICD-10-CM

## 2022-10-18 NOTE — Telephone Encounter (Signed)
Called the pt back. There was no answer. LVM asking for the pt to call back.   *If pt returns call, please inquire as to why she is out of work and needing to remain out of work. We have only seen her in the office one time 06/15/2022  and I don't see where Dr Pearlean Brownie recommended remaining out of work. Due to seizures pt probably was educated on not driving until 6 mths seizure free but I didn't see where she was instructed to stay out of work. I would need more information and then have to further discuss with MD before we could write a letter. Pt also may need actual paperwork completed.

## 2022-10-18 NOTE — Telephone Encounter (Signed)
Gastroenterology Pre-Procedure Review Colonoscopy to be scheduled once blood thinner advice has been received from Dr. Darreld Mclean (PCP).  Also will need Neurology clearance due ti history of seizures.  Patient advised colonoscopy will be scheduled once clearances have been received.  Request Date: TBD Requesting Physician: Dr. Jodelle Gross  PATIENT REVIEW QUESTIONS: The patient responded to the following health history questions as indicated:    1. Are you having any GI issues? yes (3 days ago stomach cramped) 2. Do you have a personal history of Polyps? no 3. Do you have a family history of Colon Cancer or Polyps? no 4. Diabetes Mellitus? no 5. Joint replacements in the past 12 months?no 6. Major health problems in the past 3 months? 05/05/22 Gastroenteritis, 04/23/22 Syncopy 7. Any artificial heart valves, MVP, or defibrillator?no  8. Neurology issues? History of stroke clearance to be sent to Dr. Delia Heady MEDICATIONS & ALLERGIES:    Patient reports the following regarding taking any anticoagulation/antiplatelet therapy:   Plavix, Coumadin, Eliquis, Xarelto, Lovenox, Pradaxa, Brilinta, or Effient? yes (prescribed by Dr. Darreld Mclean) Aspirin? yes (81 mg daily)  Patient confirms/reports the following medications:  Current Outpatient Medications  Medication Sig Dispense Refill   albuterol (PROVENTIL HFA;VENTOLIN HFA) 108 (90 Base) MCG/ACT inhaler Inhale 2 puffs into the lungs every 6 (six) hours as needed for wheezing or shortness of breath. 1 Inhaler 2   aspirin 81 MG chewable tablet Chew 1 tablet (81 mg total) by mouth daily. 30 tablet 2   citalopram (CELEXA) 20 MG tablet Take 20 mg by mouth at bedtime.     cyclobenzaprine (FLEXERIL) 10 MG tablet Take 10 mg by mouth 3 (three) times daily.     hydrochlorothiazide (HYDRODIURIL) 25 MG tablet Take 25 mg by mouth daily.     levETIRAcetam (KEPPRA) 500 MG tablet Take 1 tablet (500 mg total) by mouth 2 (two) times daily. 60 tablet 0   NIFEdipine  (ADALAT CC) 30 MG 24 hr tablet Take 30 mg by mouth daily.     ondansetron (ZOFRAN) 4 MG tablet Take 4 mg by mouth every 8 (eight) hours as needed for nausea.     Potassium Chloride ER 20 MEQ TBCR Take 1 tablet by mouth daily.     rosuvastatin (CRESTOR) 20 MG tablet Take 1 tablet (20 mg total) by mouth daily. 30 tablet 2   ticagrelor (BRILINTA) 90 MG TABS tablet Take 1 tablet (90 mg total) by mouth 2 (two) times daily. 60 tablet 2   diphenhydrAMINE (BENADRYL ALLERGY) 25 mg capsule Take 1 capsule (25 mg total) by mouth every 6 (six) hours as needed for up to 5 days. 10 capsule 0   No current facility-administered medications for this visit.    Patient confirms/reports the following allergies:  Allergies  Allergen Reactions   Tramadol Rash    No orders of the defined types were placed in this encounter.   AUTHORIZATION INFORMATION Primary Insurance: 1D#: Group #:  Secondary Insurance: 1D#: Group #:  SCHEDULE INFORMATION: Date:  Time: Location:

## 2022-10-18 NOTE — Telephone Encounter (Signed)
Per pt phone call pt  wans to schedule colonoscopy. The referral is listed in pt chart

## 2022-10-18 NOTE — Telephone Encounter (Signed)
Patient confirmed she only takes 1 blood thinner and that's the Clopidogrel.  Clearance sent to Dr. Darreld Mclean, PCP.  Thanks,  [place], [place]

## 2022-10-24 ENCOUNTER — Telehealth: Payer: 59 | Admitting: Adult Health

## 2022-10-24 ENCOUNTER — Telehealth: Payer: Self-pay

## 2022-10-24 NOTE — Telephone Encounter (Signed)
 Pt requested call back to schedule colonoscopy

## 2022-10-25 ENCOUNTER — Encounter: Payer: Self-pay | Admitting: Family Medicine

## 2022-10-26 NOTE — Telephone Encounter (Signed)
[place] from the PCP office called in because patient can not stop her blood thinner. Patient is not allow to stop taking her Plavix. Please call her for more details.

## 2022-11-01 ENCOUNTER — Emergency Department: Payer: 59

## 2022-11-01 ENCOUNTER — Other Ambulatory Visit: Payer: Self-pay

## 2022-11-01 ENCOUNTER — Emergency Department
Admission: EM | Admit: 2022-11-01 | Discharge: 2022-11-01 | Disposition: A | Discharge status: Home / Self Care | Payer: 59 | Attending: Emergency Medicine | Admitting: Emergency Medicine

## 2022-11-01 ENCOUNTER — Encounter: Payer: Self-pay | Admitting: Emergency Medicine

## 2022-11-01 DIAGNOSIS — Z8673 Personal history of transient ischemic attack (TIA), and cerebral infarction without residual deficits: Secondary | ICD-10-CM | POA: Insufficient documentation

## 2022-11-01 DIAGNOSIS — M79622 Pain in left upper arm: Secondary | ICD-10-CM | POA: Insufficient documentation

## 2022-11-01 DIAGNOSIS — I10 Essential (primary) hypertension: Secondary | ICD-10-CM | POA: Insufficient documentation

## 2022-11-01 DIAGNOSIS — J45909 Unspecified asthma, uncomplicated: Secondary | ICD-10-CM | POA: Diagnosis not present

## 2022-11-01 DIAGNOSIS — M7989 Other specified soft tissue disorders: Secondary | ICD-10-CM | POA: Diagnosis not present

## 2022-11-01 DIAGNOSIS — M79602 Pain in left arm: Secondary | ICD-10-CM

## 2022-11-01 LAB — COMPREHENSIVE METABOLIC PANEL WITH GFR
ALT: 17 U/L (ref 0–44)
AST: 53 U/L — ABNORMAL HIGH (ref 15–41)
Albumin: 4.1 g/dL (ref 3.5–5.0)
Alkaline Phosphatase: 83 U/L (ref 38–126)
Anion gap: 11 (ref 5–15)
BUN: 12 mg/dL (ref 6–20)
CO2: 23 mmol/L (ref 22–32)
Calcium: 9 mg/dL (ref 8.9–10.3)
Chloride: 102 mmol/L (ref 98–111)
Creatinine, Ser: 1.13 mg/dL — ABNORMAL HIGH (ref 0.44–1.00)
GFR, Estimated: 57 mL/min — ABNORMAL LOW (ref >60)
Glucose, Bld: 164 mg/dL — ABNORMAL HIGH (ref 70–99)
Potassium: 3.4 mmol/L — ABNORMAL LOW (ref 3.5–5.1)
Sodium: 136 mmol/L (ref 135–145)
Total Bilirubin: 0.6 mg/dL (ref 0.3–1.2)
Total Protein: 7.3 g/dL (ref 6.5–8.1)

## 2022-11-01 LAB — DIFFERENTIAL
Abs Immature Granulocytes: 0.04 10*3/uL (ref 0.00–0.07)
Basophils Absolute: 0 10*3/uL (ref 0.0–0.1)
Basophils Relative: 0 %
Eosinophils Absolute: 0.1 10*3/uL (ref 0.0–0.5)
Eosinophils Relative: 1 %
Immature Granulocytes: 1 %
Lymphocytes Relative: 31 %
Lymphs Abs: 2.5 10*3/uL (ref 0.7–4.0)
Monocytes Absolute: 0.5 10*3/uL (ref 0.1–1.0)
Monocytes Relative: 6 %
Neutro Abs: 4.8 10*3/uL (ref 1.7–7.7)
Neutrophils Relative %: 61 %

## 2022-11-01 LAB — CBC
HCT: 39.9 % (ref 36.0–46.0)
Hemoglobin: 13.4 g/dL (ref 12.0–15.0)
MCH: 32.8 pg (ref 26.0–34.0)
MCHC: 33.6 g/dL (ref 30.0–36.0)
MCV: 97.6 fL (ref 80.0–100.0)
Platelets: 248 K/uL (ref 150–400)
RBC: 4.09 MIL/uL (ref 3.87–5.11)
RDW: 12.1 % (ref 11.5–15.5)
WBC: 7.9 K/uL (ref 4.0–10.5)
nRBC: 0 % (ref 0.0–0.2)

## 2022-11-01 MED ORDER — LIDOCAINE 5 % EX PTCH: 1.0000 | MEDICATED_PATCH | Freq: Two times a day (BID) | CUTANEOUS | 0 refills | Status: AC

## 2022-11-01 MED ORDER — OXYCODONE-ACETAMINOPHEN 5-325 MG PO TABS
1.0000 | ORAL_TABLET | Freq: Once | ORAL | Status: AC
  Administered 2022-11-01: 1 via ORAL
  Filled 2022-11-01: qty 1

## 2022-11-01 MED ORDER — SODIUM CHLORIDE 0.9% FLUSH: 3.0000 mL | Freq: Once | INTRAVENOUS | Status: DC

## 2022-11-01 NOTE — ED Triage Notes (Signed)
Patient to ED via POV for left face/left arm numbness x2 months. Also started left arm pain and feeling "weird." Also having muscle cramping in bilateral lower legs x2 months. Hx of stroke- left side deficits but numbness new over the past 2 months.

## 2022-11-01 NOTE — ED Notes (Signed)
Patient transported to Ultrasound 

## 2022-11-01 NOTE — ED Provider Notes (Signed)
[facility] Provider Note    Event Date/Time   First MD Initiated Contact with Patient 11/01/22 1502     (approximate)   History   Chief Complaint Arm Pain  HPI  Patricia [name] is a 57 y.o. female with past medical history of hypertension, hyperlipidemia, stroke with left-sided deficits, seizures, and asthma who presents to the ED complaining of arm pain.  Patient reports that she has been dealing with constant pain in her left upper arm for about the past 2 months.  She describes throbbing discomfort starting around her mid humerus and moving up into the left shoulder.  She denies any falls or other injuries to the arm, has noticed some mild swelling to the area.  She reports significant pain with changes in position and particularly when trying to lift her left arm up above her head.  She deals with chronic weakness in this arm due to prior stroke 7 months ago, denies any changes in her strength and has not had any numbness.  She denies any pain when moving her left hand or wrist.  She has not had any pain in her neck, denies pain radiating down into her arm.     Physical Exam   Triage Vital Signs: ED Triage Vitals  Encounter Vitals Group     BP 11/01/22 1344 (!) 149/105     Systolic BP Percentile --      Diastolic BP Percentile --      Pulse Rate 11/01/22 1344 (!) 106     Resp 11/01/22 1344 18     Temp 11/01/22 1344 98.3 F (36.8 C)     Temp Source 11/01/22 1344 Oral     SpO2 11/01/22 1344 99 %     Weight 11/01/22 1339 184 lb (83.5 kg)     Height 11/01/22 1339 5\' 5"  (1.651 m)     Head Circumference --      Peak Flow --      Pain Score 11/01/22 1339 10     Pain Loc --      Pain Education --      Exclude from Growth Chart --     Most recent vital signs: Vitals:   11/01/22 2005 11/01/22 2006  BP: (!) 151/97 (!) 151/97  Pulse: 95 95  Resp:  18  Temp:    SpO2: 99% 99%    Constitutional: Alert and oriented. Eyes: Conjunctivae are  normal. Head: Atraumatic. Nose: No congestion/rhinnorhea. Mouth/Throat: Mucous membranes are moist.  Neck: No midline cervical spine tenderness to palpation. Cardiovascular: Normal rate, regular rhythm. Grossly normal heart sounds.  2+ radial pulses bilaterally. Respiratory: Normal respiratory effort.  No retractions. Lungs CTAB. Gastrointestinal: Soft and nontender. No distention. Musculoskeletal: Diffuse tenderness to palpation over left upper arm including bicep and tricep as well as left shoulder.  Pain upon range of motion at left shoulder with no erythema, edema, or warmth.  No pain upon range of motion of left elbow or wrist and no tenderness of the forearm or hand.  No lower extremity tenderness nor edema.  Neurologic:  Normal speech and language.  5 out of 5 strength in right upper and lower extremities, 4 out of 5 strength in left upper and lower extremities.    ED Results / Procedures / Treatments   Labs (all labs ordered are listed, but only abnormal results are displayed) Labs Reviewed  COMPREHENSIVE METABOLIC PANEL - Abnormal; Notable for the following components:      Result  Value   Potassium 3.4 (*)    Glucose, Bld 164 (*)    Creatinine, Ser 1.13 (*)    AST 53 (*)    GFR, Estimated 57 (*)    All other components within normal limits  CBC  DIFFERENTIAL  I-STAT CREATININE, ED  CBG MONITORING, ED     EKG  ED ECG REPORT I, Chesley Noon, the attending physician, personally viewed and interpreted this ECG.   Date: 11/01/2022  EKG Time: 13:49  Rate: 99  Rhythm: normal sinus rhythm  Axis: Normal  Intervals:none  ST&T Change: None  RADIOLOGY CT head reviewed and interpreted by me with no hemorrhage or midline shift.  PROCEDURES:  Critical Care performed: No  Procedures   MEDICATIONS ORDERED IN ED: Medications  sodium chloride flush (NS) 0.9 % injection 3 mL (3 mLs Intravenous Not Given 11/01/22 1553)  oxyCODONE-acetaminophen (PERCOCET/ROXICET) 5-325  MG per tablet 1 tablet (1 tablet Oral Given 11/01/22 1528)     IMPRESSION / MDM / ASSESSMENT AND PLAN / ED COURSE  I reviewed the triage vital signs and the nursing notes.                              57 y.o. female with past medical history of hypertension, hyperlipidemia, stroke with left-sided deficits, seizures, and asthma who presents to the ED with 2 months of worsening pain in her left upper arm with no recent trauma.  Patient's presentation is most consistent with acute presentation with potential threat to life or bodily function.  Differential diagnosis includes, but is not limited to, fracture, dislocation, peripheral neuropathy, DVT, cellulitis, septic arthritis, gouty arthritis, osteoarthritis, arterial insufficiency.  Patient nontoxic-appearing and in no acute distress, vital signs are unremarkable.  Patient apparently reported left face and left arm numbness for 2 months in triage, but on my assessment she states that the primary reason she is here is due to pain of her left upper arm.  She denies any change in chronic numbness or weakness to her left arm and denies any symptoms affecting her face.  She has strong radial pulse in the left arm and no signs of infection, pain reproducible primarily with range of motion of the left shoulder as well as palpation of the upper arm.  We will further assess with x-ray of her left shoulder and elbow, also check ultrasound for evidence of DVT given limited mobility in this arm.  Labs showed no significant anemia, leukocytosis, or electrolyte abnormality, patient does have mild AKI but LFTs are unremarkable.  We will treat symptomatically with Percocet, CT head results are pending at this time but no new neurologic deficits to suggest stroke.  Ultrasound of left upper extremity is negative for DVT, x-rays show no evidence of bony abnormality.  Patient's pain is improved on reassessment and she is appropriate for discharge home with outpatient  orthopedic follow-up.  She was counseled to return to the ED for new or worsening symptoms.  Patient agrees with plan.      FINAL CLINICAL IMPRESSION(S) / ED DIAGNOSES   Final diagnoses:  Pain of left upper extremity  History of stroke     Rx / DC Orders   ED Discharge Orders          Ordered    lidocaine (LIDODERM) 5 %  Every 12 hours        11/01/22 2105  Note:  This document was prepared using Dragon voice recognition software and may include unintentional dictation errors.   Chesley Noon, MD 11/01/22 2106

## 2022-11-03 ENCOUNTER — Other Ambulatory Visit (HOSPITAL_COMMUNITY): Payer: Self-pay | Admitting: Interventional Radiology

## 2022-11-03 DIAGNOSIS — I63511 Cerebral infarction due to unspecified occlusion or stenosis of right middle cerebral artery: Secondary | ICD-10-CM

## 2022-11-28 ENCOUNTER — Telehealth: Payer: Self-pay

## 2022-11-28 NOTE — Telephone Encounter (Signed)
Patient left a voicemail on the main line calling to schedule her colonoscopy she states no one has called her or reach out to her about scheduling a appointment

## 2022-11-29 ENCOUNTER — Telehealth: Payer: Self-pay

## 2022-11-29 NOTE — Telephone Encounter (Signed)
Patient has been contacted to let her know that her PCP Darreld Mclean, nurse advised me on 10/24/22 that its too soon for her to stop her clopidogrel therefore Colonoscopy could not be scheduled.  I've informed patient that I will send a new request over to her PCP to see if she is able to stop it now to have her colonoscopy scheduled.  Blood Thinner advice sent back to PCP Darreld Mclean to follow up .  Thanks,  [place], [place]

## 2022-12-01 ENCOUNTER — Other Ambulatory Visit: Payer: Self-pay | Admitting: Family Medicine

## 2022-12-01 DIAGNOSIS — Z1231 Encounter for screening mammogram for malignant neoplasm of breast: Secondary | ICD-10-CM

## 2022-12-08 ENCOUNTER — Telehealth: Payer: Self-pay | Admitting: Neurology

## 2022-12-08 ENCOUNTER — Telehealth: Payer: Self-pay

## 2022-12-08 NOTE — Telephone Encounter (Signed)
Turkey, RN @[facility]  [employer]  is asking to be called back pt's GI Dr needing to do a procedure and they are needing to discus a hold on pt's  clopidogrel (PLAVIX) 75 MG tablet

## 2022-12-08 NOTE — Telephone Encounter (Signed)
Called the clinic back. Pt had a stroke in feb 2024 and has been doing well. She is prescribed asa and plavix daily.  The PCP reached out inquiring about holding her medications prior to the colonoscopy. Advised that pt would need to hold the asa and plavix. Plavix would need to be held 5 days prior to the procedure and the asa would be need to be held 3 days prior. Pt must resume post op. She was appreciative for the call back

## 2022-12-08 NOTE — Telephone Encounter (Signed)
Turkey from [facility] contacting office to update on the status of patient blood thinner advice.  She said they are still waiting on Dr. Pearlean Brownie to advise on when patient can stop Plavix but will let me know when they receive it and fax it to the office.  Thanks, [place], [place]

## 2022-12-19 ENCOUNTER — Other Ambulatory Visit: Payer: Self-pay

## 2022-12-19 ENCOUNTER — Telehealth: Payer: Self-pay

## 2022-12-19 DIAGNOSIS — Z1211 Encounter for screening for malignant neoplasm of colon: Secondary | ICD-10-CM

## 2022-12-19 MED ORDER — GOLYTELY 236 G PO SOLR: 4000.0000 mL | Freq: Once | ORAL | 0 refills | Status: AC

## 2022-12-19 NOTE — Telephone Encounter (Signed)
Gastroenterology Pre-Procedure Review  Patient has been advised per PCP Darreld Mclean on 12/19/22 to stop Plavix 5 days prior to colonoscopy and restart 1 day after.  Hold Aspirin 3 days prior to colonoscopy restart 1 day after colonoscopy.   Request Date: 01/11/23 Requesting Physician: Dr. Allegra Lai  PATIENT REVIEW QUESTIONS: The patient responded to the following health history questions as indicated:    1. Are you having any GI issues? no 2. Do you have a personal history of Polyps? no 3. Do you have a family history of Colon Cancer or Polyps? no 4. Diabetes Mellitus? no 5. Joint replacements in the past 12 months?no 6. Major health problems in the past 3 months?no 7. Any artificial heart valves, MVP, or defibrillator?no    MEDICATIONS & ALLERGIES:    Patient reports the following regarding taking any anticoagulation/antiplatelet therapy:   Plavix, Coumadin, Eliquis, Xarelto, Lovenox, Pradaxa, Brilinta, or Effient? yes (Plavix has been advised to stop 5 days prior to colonoscopy and restart 1 day after.  Hold aspirin 3 days prior to procedure) Aspirin? yes (81mg  has been advised to stop 3 days prior to colonoscopy)  Patient confirms/reports the following medications:  Current Outpatient Medications  Medication Sig Dispense Refill   albuterol (PROVENTIL HFA;VENTOLIN HFA) 108 (90 Base) MCG/ACT inhaler Inhale 2 puffs into the lungs every 6 (six) hours as needed for wheezing or shortness of breath. 1 Inhaler 2   aspirin 81 MG chewable tablet Chew 1 tablet (81 mg total) by mouth daily. 30 tablet 2   citalopram (CELEXA) 20 MG tablet Take 20 mg by mouth at bedtime.     clopidogrel (PLAVIX) 75 MG tablet Take 75 mg by mouth daily.     cyclobenzaprine (FLEXERIL) 10 MG tablet Take 10 mg by mouth 3 (three) times daily.     diphenhydrAMINE (BENADRYL ALLERGY) 25 mg capsule Take 1 capsule (25 mg total) by mouth every 6 (six) hours as needed for up to 5 days. 10 capsule 0   hydrochlorothiazide  (HYDRODIURIL) 25 MG tablet Take 25 mg by mouth daily.     KLOR-CON M20 20 MEQ tablet      levETIRAcetam (KEPPRA) 500 MG tablet Take 1 tablet (500 mg total) by mouth 2 (two) times daily. 60 tablet 0   lidocaine (LIDODERM) 5 % Place 1 patch onto the skin every 12 (twelve) hours. Remove & Discard patch within 12 hours or as directed by MD 10 patch 0   NIFEdipine (ADALAT CC) 30 MG 24 hr tablet Take 30 mg by mouth daily.     ondansetron (ZOFRAN) 4 MG tablet Take 4 mg by mouth every 8 (eight) hours as needed for nausea.     Potassium Chloride ER 20 MEQ TBCR Take 1 tablet by mouth daily.     rosuvastatin (CRESTOR) 20 MG tablet Take 1 tablet (20 mg total) by mouth daily. 30 tablet 2   ticagrelor (BRILINTA) 90 MG TABS tablet Take 1 tablet (90 mg total) by mouth 2 (two) times daily. 60 tablet 2   Vitamin D, Ergocalciferol, (DRISDOL) 1.25 MG (50000 UNIT) CAPS capsule Take 50,000 Units by mouth once a week.     No current facility-administered medications for this visit.    Patient confirms/reports the following allergies:  Allergies  Allergen Reactions   Tramadol Rash    No orders of the defined types were placed in this encounter.   AUTHORIZATION INFORMATION Primary Insurance: 1D#: Group #:  Secondary Insurance: 1D#: Group #:  SCHEDULE INFORMATION: Date: 01/11/23 Time: Location:  [facility]

## 2022-12-21 ENCOUNTER — Ambulatory Visit (INDEPENDENT_AMBULATORY_CARE_PROVIDER_SITE_OTHER): Payer: 59 | Admitting: Adult Health

## 2022-12-21 ENCOUNTER — Telehealth (HOSPITAL_COMMUNITY): Payer: Self-pay

## 2022-12-21 ENCOUNTER — Encounter: Payer: Self-pay | Admitting: Adult Health

## 2022-12-21 VITALS — BP 135/91 | HR 108 | Ht 65.0 in | Wt 190.8 lb

## 2022-12-21 DIAGNOSIS — I63511 Cerebral infarction due to unspecified occlusion or stenosis of right middle cerebral artery: Secondary | ICD-10-CM | POA: Diagnosis not present

## 2022-12-21 DIAGNOSIS — I69398 Other sequelae of cerebral infarction: Secondary | ICD-10-CM

## 2022-12-21 DIAGNOSIS — R569 Unspecified convulsions: Secondary | ICD-10-CM | POA: Diagnosis not present

## 2022-12-21 MED ORDER — LEVETIRACETAM 500 MG PO TABS: 500.0000 mg | ORAL_TABLET | Freq: Two times a day (BID) | ORAL | 3 refills | Status: DC

## 2022-12-21 NOTE — Telephone Encounter (Signed)
Called to inform pt that an order was entered for her CTA at [facility]. No answer, left vm with information for her to call and schedule. Unfortunately, I am unable to schedule there and per insurance it is only approved for that campus. AB

## 2022-12-21 NOTE — Patient Instructions (Signed)
Continue keppra 500mg  twice daily for seizure prevention  Continue aspirin 81 mg daily  and Crestor  for secondary stroke prevention  Will follow up with Dr. Corliss Skains IR to schedule follow up visit and to determine ongoing need of Plavix   Continue to follow up with PCP regarding blood pressure and cholesterol management  Maintain strict control of hypertension with blood pressure goal below 130/90 and cholesterol with LDL cholesterol (bad cholesterol) goal below 70 mg/dL.   Signs of a Stroke? Follow the BEFAST method:  Balance Watch for a sudden loss of balance, trouble with coordination or vertigo Eyes Is there a sudden loss of vision in one or both eyes? Or double vision?  Face: Ask the person to smile. Does one side of the face droop or is it numb?  Arms: Ask the person to raise both arms. Does one arm drift downward? Is there weakness or numbness of a leg? Speech: Ask the person to repeat a simple phrase. Does the speech sound slurred/strange? Is the person confused ? Time: If you observe any of these signs, call 911.      Followup in the future with me in 6 months or call earlier if needed       Thank you for coming to see [place] at [facility]. I hope we have been able to provide you high quality care today.  You may receive a patient satisfaction survey over the next few weeks. We would appreciate your feedback and comments so that we may continue to improve ourselves and the health of our patients.

## 2022-12-21 NOTE — Telephone Encounter (Signed)
Left a message with our fax and contact information for Dr. Verdis Prime office to fax a formal request for anticoagulation hold prior to colonoscopy. AB

## 2022-12-21 NOTE — Progress Notes (Signed)
[facility] [address] [place]. [place] [address] [phone]       OFFICE FOLLOW-UP NOTE  [name] Date of Birth:  Nov 30, 1965 Medical Record Number:  [id number]    Primary neurologist: Dr. Pearlean Brownie Reason for visit: stroke and seizure   Chief Complaint  Patient presents with   Follow-up    Patient in room #3 with hier young son. Patient states she hasn't had any episode of epilepsy. Patient states she having pain in her left arm.      HPI:   Update 12/21/2022 JM: Patient returns for scheduled follow-up visit accompanied by her son.   She was seen in the ED on 5/4 for a generalized tonic-clonic seizure.  No clear cause identified, labs largely unremarkable, CTH no acute abnormality.  Denies any missed Keppra dosages.  No medication adjustments recommended, continued on Keppra 500 mg twice daily.  Return to ED on 5/9 after feeling weak, lightheaded and shaky.  She took her Keppra as she had not taken it yet that morning.  Reports similar symptoms that preceded prior seizures, no actual seizure activity.  Lab work largely unremarkable except mild hypokalemia already on supplement, CTH no acute abnormality.  She also endorsed increased anxiety and concern of possible panic attack, advised outpatient follow-up with PCP.   Denies any additional seizures since that time, remains on keppra 500mg  twice daily, denies side effects. EEG previously completed on 5/1 which was normal.   Stable from stroke standpoint without new stroke/TIA symptoms. Does still have some mild left hand weakness but otherwise denies any residual deficits. Reports she has been out of work since her stroke, currently applying for Social Security disability.   Does c/o left arm pain over the past several months, currently being followed by ortho, recently started PT but is concerned that PT is primarily focusing on neck causing pain but she believes the pain is coming from her  shoulder/arm. She was initially having some more pain but no longer experiencing this.  This has been impacting her sleep and daily activity.  She has a f/u visit with PT this afternoon.   Remains on aspirin and Crestor.  She was transitioned from brilinta to plavix by PCP as unable to get refill of Brilinta after first refill (since around March), she has not had any follow up with with IR. She questions holding medications for colonoscopy scheduled on 11/13. Routinely follows with PCP for stroke risk factor management     History provided for reference purposes only Initial visit 06/15/2022 Dr. Pearlean Brownie: 57 year old lady seen today for initial office visit following hospital admission for stroke in February 2024.  History is obtained from the patient and review of electronic medical records and I personally reviewed pertinent available imaging films in PACS.  He has past medical history of hypertension, hyperlipidemia, asthma and bronchitis.  He presented on 04/09/2022 with sudden onset of left arm numbness.  EMS activated a code stroke.  On admission she had a manage stroke scale of 14 with right gaze deviation and left hemiplegia.  Received IV TNK.  CT head showed dense right M1 sign and CT angiogram showed acute occlusion of the distal right M1 with transient filling defect at the ICA just distal to the bifurcation suggestive of carotid web.  She underwent emergent mechanical thrombectomy with TICI 3 revascularization of the right M1 and placement of rescue right proximal ICA stent.  The carotid vessels.  She did very well and was extubated and made  gradual improvement.  2D echo showed normal ejection fraction of 60 to 65%.  LDL cholesterol was 81 mg percent.  Hemoglobin A1c was 5.1.  Urine drug screen was positive for marijuana.  Patient had a small right groin pseudoaneurysm with resolved left manual compression.  She was placed on aspirin and Brilinta and Crestor.  She was counseled to quit smoking  cigarettes and use marijuana.  Patient states is done very well and almost full neurological recovery without recurrent stroke or TIA symptoms.  However she was seen in the ER on 04/26/2022 by Dr. Amada Jupiter and treated the left episode of becoming flushed with sweating and lightheadedness with brief loss of consciousness with stiffening and shaking of upper extremities lasting 15 seconds.  This happened after she had developed a gastrointestinal illness.  She had 2 such episodes while she was in a seated position.  There was no tongue bite or incontinence.  EEG was obtained which showed right posterior sharp waves with phase reversal.  He was started on Keppra for seizure prophylaxis.  He has had no further such episodes since then.  He states he is able to resume all activities of daily living.  Occasionally she feels that the left leg drags when she is tired.  She also gets some occasional cramps in the left hand.  She has no new complaints today.     ROS:   14 system review of systems is positive for those noted in HPI and all other systems negative  PMH:  Past Medical History:  Diagnosis Date   Asthma    Bronchitis    HLD (hyperlipidemia)    Hypertension    Stroke [facility])     Social History:  Social History   Socioeconomic History   Marital status: Divorced    Spouse name: Not on file   Number of children: Not on file   Years of education: Not on file   Highest education level: Not on file  Occupational History   Not on file  Tobacco Use   Smoking status: Former    Types: Cigars    Quit date: 02/11/2021    Years since quitting: 1.8   Smokeless tobacco: Never  Substance and Sexual Activity   Alcohol use: Yes    Comment: occassionaly   Drug use: Yes    Types: Marijuana   Sexual activity: Not on file  Other Topics Concern   Not on file  Social History Narrative   Not on file   Social Determinants of Health   Financial Resource Strain: Medium Risk (05/06/2022)   Received  [facility]   Overall Financial Resource Strain (CARDIA)    Difficulty of Paying Living Expenses: Somewhat hard  Food Insecurity: No Food Insecurity (04/23/2022)   Hunger Vital Sign    Worried About Running Out of Food in the Last Year: Never true    Ran Out of Food in the Last Year: Never true  Transportation Needs: No Transportation Needs (04/23/2022)   PRAPARE - Administrator, Civil Service (Medical): No    Lack of Transportation (Non-Medical): No  Physical Activity: Not on file  Stress: Not on file  Social Connections: Not on file  Intimate Partner Violence: Not At Risk (04/23/2022)   Humiliation, Afraid, Rape, and Kick questionnaire    Fear of Current or Ex-Partner: No    Emotionally Abused: No    Physically Abused: No    Sexually Abused: No    Medications:  Current Outpatient Medications on File Prior to Visit  Medication Sig Dispense Refill   albuterol (PROVENTIL HFA;VENTOLIN HFA) 108 (90 Base) MCG/ACT inhaler Inhale 2 puffs into the lungs every 6 (six) hours as needed for wheezing or shortness of breath. 1 Inhaler 2   aspirin 81 MG chewable tablet Chew 1 tablet (81 mg total) by mouth daily. 30 tablet 2   celecoxib (CELEBREX) 200 MG capsule Take 200 mg by mouth daily.     citalopram (CELEXA) 20 MG tablet Take 20 mg by mouth at bedtime.     clopidogrel (PLAVIX) 75 MG tablet Take 75 mg by mouth daily.     cyclobenzaprine (FLEXERIL) 10 MG tablet Take 10 mg by mouth 3 (three) times daily.     gabapentin (NEURONTIN) 100 MG capsule Take 100 mg by mouth 2 (two) times daily.     hydrochlorothiazide (HYDRODIURIL) 25 MG tablet Take 25 mg by mouth daily.     HYDROcodone-acetaminophen (NORCO/VICODIN) 5-325 MG tablet Take 1 tablet by mouth every 6 (six) hours as needed.     KLOR-CON M20 20 MEQ tablet      levETIRAcetam (KEPPRA) 500 MG tablet Take 1 tablet (500 mg total) by mouth 2 (two) times daily. 60 tablet 0   NIFEdipine (ADALAT CC) 30 MG 24 hr  tablet Take 30 mg by mouth daily.     ondansetron (ZOFRAN) 4 MG tablet Take 4 mg by mouth every 8 (eight) hours as needed for nausea.     Potassium Chloride ER 20 MEQ TBCR Take 1 tablet by mouth daily.     rosuvastatin (CRESTOR) 20 MG tablet Take 1 tablet (20 mg total) by mouth daily. 30 tablet 2   tiZANidine (ZANAFLEX) 4 MG tablet Take 4 mg by mouth at bedtime.     Vitamin D, Ergocalciferol, (DRISDOL) 1.25 MG (50000 UNIT) CAPS capsule Take 50,000 Units by mouth once a week.     diphenhydrAMINE (BENADRYL ALLERGY) 25 mg capsule Take 1 capsule (25 mg total) by mouth every 6 (six) hours as needed for up to 5 days. 10 capsule 0   lidocaine (LIDODERM) 5 % Place 1 patch onto the skin every 12 (twelve) hours. Remove & Discard patch within 12 hours or as directed by MD (Patient not taking: Reported on 12/21/2022) 10 patch 0   ticagrelor (BRILINTA) 90 MG TABS tablet Take 1 tablet (90 mg total) by mouth 2 (two) times daily. (Patient not taking: Reported on 12/19/2022) 60 tablet 2   No current facility-administered medications on file prior to visit.    Allergies:   Allergies  Allergen Reactions   Tramadol Rash    Physical Exam Today's Vitals   12/21/22 0928  BP: (!) 135/91  Pulse: (!) 108  Weight: 190 lb 12.8 oz (86.5 kg)  Height: 5\' 5"  (1.651 m)   Body mass index is 31.75 kg/m.   General: well developed, well nourished pleasant middle-aged lady, seated, in no evident distress Head: head normocephalic and atraumatic.  Neck: supple with no carotid or supraclavicular bruits Cardiovascular: regular rate and rhythm, no murmurs Musculoskeletal: no deformity Skin:  no rash/petichiae Vascular:  Normal pulses all extremities  Neurologic Exam Mental Status: Awake and fully alert. Oriented to place and time. Recent and remote memory intact. Attention span, concentration and fund of knowledge appropriate. Mood and affect appropriate.  Cranial Nerves: Fundoscopic exam reveals sharp disc margins.  Pupils equal, briskly reactive to light. Extraocular movements full without nystagmus. Visual fields full to confrontation. Hearing intact. Facial sensation intact.  Face, tongue, palate moves normally and symmetrically.  Motor: Normal bulk and tone. Normal strength in all tested extremity muscles.  Diminished fine finger movements on the left.  Orbits right over left upper extremity. Sensory.: intact to touch ,pinprick .position and vibratory sensation.  Coordination: Rapid alternating movements normal in all extremities. Finger-to-nose and heel-to-shin performed accurately bilaterally. Gait and Station: Arises from chair without difficulty. Stance is normal. Gait demonstrates normal stride length and balance . Able to heel, toe and tandem walk without difficulty.  Reflexes: 1+ and symmetric. Toes downgoing.        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  Vascular risk factors of mild hyperlipidemia, hypertension and carotid web. 2 episodes of brief altered consciousness possibly syncopal episodes versus complex partial seizures on 04/26/2022, ED eval 5/4 with witnessed generalized tonic-clonic seizure activity without clear cause and ED eval 5/9 for lightheadedness/shakiness sensation which she feels prior to seizure but not actual seizure activity possibly in setting of panic attack    1.  Right MCA stroke  -Residual deficits: Mild left hand weakness with decreased dexterity. C/o left shoulder/arm pain over the past several months. Unclear if from prior stroke. Continue to follow with ortho and working with PT.   -no clear indication from a stroke standpoint that patient cannot return back to work. She is currently in the process of applying for Social  Security disability -Continue aspirin and Crestor for secondary stroke prevention measures managed/prescribed by PCP  -Continue to follow with PCP for aggressive stroke risk factor management  2. 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 S/p R ICA stent  -remains on Plavix in addition to aspirin for IR recommendations (previously unable to refill Brilinta therefore transitioned to Plavix) - further discussed with IR PA who recommends ongoing use of DAPT post stent.  -IR will contact patient to schedule f/u imaging  4. Medical clearance   -Patient requesting clearance for colonoscopy currently scheduled on 11/13  -Advised will need formal clearance request from GI office but did discuss strictly from a stroke/neurological standpoint, as patient has been stable since 03/2022, okay to hold aspirin 3-5 days prior with small but acceptable risk for recurrent stroke while off therapy, recommend restarting immediately after or once hemodynamically stable  -in regards to R ICA stent and plavix use, may need repeat imaging prior to holding plavix but advised she will need to have further discussion and clearance from IR, they will also need to be provided with clearance form from GI office     Follow-up in 6 months or call earlier if needed      I spent 40 minutes of face-to-face and non-face-to-face time with patient and son.  This included  previsit chart review, lab  review, study review, order entry, electronic health record documentation, patient education and discussion regarding above diagnoses and treatment plan and answered all other questions to patient and son's satisfaction  Ihor Austin, AGNP-BC  [facility] [address] [place], [place] [address]  Phone [phone] Fax [phone] Note: This document was prepared with digital dictation and possible smart phrase technology. Any transcriptional errors that result from this process are unintentional.

## 2022-12-22 ENCOUNTER — Telehealth: Payer: Self-pay | Admitting: Neurology

## 2022-12-22 NOTE — Telephone Encounter (Signed)
Shanda Bumps asked patient be reached out in regard to clearance for pt to move forward with GI procedure. MYchart message was sent

## 2022-12-27 ENCOUNTER — Ambulatory Visit
Admission: RE | Admit: 2022-12-27 | Discharge: 2022-12-27 | Disposition: A | Discharge status: Home / Self Care | Payer: 59 | Source: Ambulatory Visit | Attending: Interventional Radiology | Admitting: Interventional Radiology

## 2022-12-27 DIAGNOSIS — I63511 Cerebral infarction due to unspecified occlusion or stenosis of right middle cerebral artery: Secondary | ICD-10-CM

## 2022-12-27 MED ORDER — IOPAMIDOL (ISOVUE-370) INJECTION 76%
75.0000 mL | Freq: Once | INTRAVENOUS | Status: AC | PRN
  Administered 2022-12-27: 75 mL via INTRAVENOUS

## 2022-12-28 ENCOUNTER — Telehealth: Payer: Self-pay | Admitting: Gastroenterology

## 2022-12-28 ENCOUNTER — Telehealth: Payer: Self-pay

## 2022-12-28 ENCOUNTER — Telehealth (HOSPITAL_COMMUNITY): Payer: Self-pay

## 2022-12-28 NOTE — Telephone Encounter (Signed)
Feliciana Rossetti, RN from [facility] clarified that Dr. Pearlean Brownie advised for patient to stop Aspirin 3-5 days prior to colonoscopy, however Dr. Jon Billings will need to advise on stopping plavix.  New request faxed to Dr. Kandra Nicolas office as Morrie Sheldon advised for clearance advice to be provided.  Thanks,  [place], [place]

## 2022-12-28 NOTE — Telephone Encounter (Signed)
Morrie Sheldon from Dr. Marlaine Hind office called in she needs the medical clearance form for the patient (Plavix)  the fax number is [phone].

## 2022-12-28 NOTE — Telephone Encounter (Signed)
Pt agreed to f/u in 6 months with a cta head/neck. Clearance form also faxed back to Dr. Verdis Prime office for Plavix hold. AB

## 2022-12-29 NOTE — Telephone Encounter (Signed)
I spoke to Patricia [name] she is the one who want to medical clearance form.

## 2023-01-10 ENCOUNTER — Ambulatory Visit
Admission: RE | Admit: 2023-01-10 | Discharge: 2023-01-10 | Disposition: A | Discharge status: Home / Self Care | Payer: 59 | Source: Ambulatory Visit | Attending: Family Medicine | Admitting: Family Medicine

## 2023-01-10 DIAGNOSIS — Z1231 Encounter for screening mammogram for malignant neoplasm of breast: Secondary | ICD-10-CM | POA: Diagnosis present

## 2023-01-11 ENCOUNTER — Ambulatory Visit: Payer: 59 | Admitting: Anesthesiology

## 2023-01-11 ENCOUNTER — Ambulatory Visit
Admission: RE | Admit: 2023-01-11 | Discharge: 2023-01-11 | Disposition: A | Discharge status: Home / Self Care | Payer: 59 | Attending: Gastroenterology | Admitting: Gastroenterology

## 2023-01-11 ENCOUNTER — Encounter
Admission: RE | Disposition: A | Discharge status: Home / Self Care | Payer: Self-pay | Source: Home / Self Care | Attending: Gastroenterology

## 2023-01-11 DIAGNOSIS — Z7902 Long term (current) use of antithrombotics/antiplatelets: Secondary | ICD-10-CM | POA: Diagnosis not present

## 2023-01-11 DIAGNOSIS — Z87891 Personal history of nicotine dependence: Secondary | ICD-10-CM | POA: Insufficient documentation

## 2023-01-11 DIAGNOSIS — I10 Essential (primary) hypertension: Secondary | ICD-10-CM | POA: Diagnosis not present

## 2023-01-11 DIAGNOSIS — Z1211 Encounter for screening for malignant neoplasm of colon: Secondary | ICD-10-CM | POA: Insufficient documentation

## 2023-01-11 DIAGNOSIS — J45909 Unspecified asthma, uncomplicated: Secondary | ICD-10-CM | POA: Diagnosis not present

## 2023-01-11 DIAGNOSIS — K5730 Diverticulosis of large intestine without perforation or abscess without bleeding: Secondary | ICD-10-CM | POA: Diagnosis not present

## 2023-01-11 HISTORY — PX: COLONOSCOPY WITH PROPOFOL: SHX5780

## 2023-01-11 SURGERY — COLONOSCOPY WITH PROPOFOL
Anesthesia: General

## 2023-01-11 MED ORDER — LIDOCAINE HCL (CARDIAC) PF 100 MG/5ML IV SOSY
PREFILLED_SYRINGE | INTRAVENOUS | Status: DC | PRN
  Administered 2023-01-11: 40 mg via INTRAVENOUS

## 2023-01-11 MED ORDER — DEXMEDETOMIDINE HCL IN NACL 80 MCG/20ML IV SOLN
INTRAVENOUS | Status: DC | PRN
  Administered 2023-01-11: 8 ug via INTRAVENOUS

## 2023-01-11 MED ORDER — PROPOFOL 500 MG/50ML IV EMUL
INTRAVENOUS | Status: DC | PRN
  Administered 2023-01-11: 175 ug/kg/min via INTRAVENOUS

## 2023-01-11 MED ORDER — PROPOFOL 1000 MG/100ML IV EMUL
INTRAVENOUS | Status: AC
  Filled 2023-01-11: qty 100

## 2023-01-11 MED ORDER — LIDOCAINE HCL (PF) 2 % IJ SOLN
INTRAMUSCULAR | Status: AC
  Filled 2023-01-11: qty 5

## 2023-01-11 MED ORDER — DEXMEDETOMIDINE HCL IN NACL 80 MCG/20ML IV SOLN
INTRAVENOUS | Status: AC
  Filled 2023-01-11: qty 20

## 2023-01-11 MED ORDER — PROPOFOL 10 MG/ML IV BOLUS
INTRAVENOUS | Status: DC | PRN
  Administered 2023-01-11: 100 mg via INTRAVENOUS
  Administered 2023-01-11: 20 mg via INTRAVENOUS

## 2023-01-11 MED ORDER — SODIUM CHLORIDE 0.9 % IV SOLN
INTRAVENOUS | Status: DC
  Administered 2023-01-11: 20 mL/h via INTRAVENOUS

## 2023-01-11 NOTE — Anesthesia Postprocedure Evaluation (Signed)
Anesthesia Post Note  Patient: Patricia [name]  Procedure(s) Performed: COLONOSCOPY WITH PROPOFOL  Patient location during evaluation: Endoscopy Anesthesia Type: General Level of consciousness: awake and alert Pain management: pain level controlled Vital Signs Assessment: post-procedure vital signs reviewed and stable Respiratory status: spontaneous breathing, nonlabored ventilation, respiratory function stable and patient connected to nasal cannula oxygen Cardiovascular status: blood pressure returned to baseline and stable Postop Assessment: no apparent nausea or vomiting Anesthetic complications: no   No notable events documented.   Last Vitals:  Vitals:   01/11/23 0810 01/11/23 0830  BP: 136/85 (!) 150/103  Pulse: 94   Resp: 14   Temp:    SpO2: 100%     Last Pain:  Vitals:   01/11/23 0830  TempSrc:   PainSc: 0-No pain                 Cleda Mccreedy Cortina Vultaggio

## 2023-01-11 NOTE — Op Note (Signed)
[facility] Patient Name: Patricia [name] Procedure Date: 01/11/2023 6:44 AM MRN: [id number] Account #: [id number] Date of Birth: 21-Jul-1965 Admit Type: Ambulatory Age: 57 Room: [facility] ENDO ROOM 4 Gender: Female Note Status: Finalized Instrument Name: Prentice Docker [id number] Procedure:             Colonoscopy Indications:           Screening for colorectal malignant neoplasm, This is                         the patient's first colonoscopy Providers:             Toney Reil MD, MD Referring MD:          Leanna Sato, MD (Referring MD) Medicines:             General Anesthesia Complications:         No immediate complications. Estimated blood loss: None. Procedure:             Pre-Anesthesia Assessment:                        - Prior to the procedure, a History and Physical was                         performed, and patient medications and allergies were                         reviewed. The patient is competent. The risks and                         benefits of the procedure and the sedation options and                         risks were discussed with the patient. All questions                         were answered and informed consent was obtained.                         Patient identification and proposed procedure were                         verified by the physician, the nurse, the                         anesthesiologist, the anesthetist and the technician                         in the pre-procedure area in the procedure room in the                         endoscopy suite. Mental Status Examination: alert and                         oriented. Airway Examination: normal oropharyngeal                         airway and neck mobility. Respiratory Examination:  clear to auscultation. CV Examination: normal.                         Prophylactic Antibiotics: The patient does not require                          prophylactic antibiotics. Prior Anticoagulants: The                         patient has taken Plavix (clopidogrel), last dose was                         5 days prior to procedure. ASA Grade Assessment: III -                         A patient with severe systemic disease. After                         reviewing the risks and benefits, the patient was                         deemed in satisfactory condition to undergo the                         procedure. The anesthesia plan was to use general                         anesthesia. Immediately prior to administration of                         medications, the patient was re-assessed for adequacy                         to receive sedatives. The heart rate, respiratory                         rate, oxygen saturations, blood pressure, adequacy of                         pulmonary ventilation, and response to care were                         monitored throughout the procedure. The physical                         status of the patient was re-assessed after the                         procedure.                        After obtaining informed consent, the colonoscope was                         passed under direct vision. Throughout the procedure,                         the patient's blood pressure, pulse, and oxygen  saturations were monitored continuously. The                         Colonoscope was introduced through the anus and                         advanced to the the cecum, identified by appendiceal                         orifice and ileocecal valve. The colonoscopy was                         performed without difficulty. The patient tolerated                         the procedure well. The quality of the bowel                         preparation was evaluated using the BBPS [facility] Bowel                         Preparation Scale) with scores of: Right Colon = 3,                         Transverse Colon = 3 and  Left Colon = 3 (entire mucosa                         seen well with no residual staining, small fragments                         of stool or opaque liquid). The total BBPS score                         equals 9. The ileocecal valve, appendiceal orifice,                         and rectum were photographed. Findings:      The perianal and digital rectal examinations were normal. Pertinent       negatives include normal sphincter tone and no palpable rectal lesions.      Multiple diverticula were found in the sigmoid colon.      The retroflexed view of the distal rectum and anal verge was normal and       showed no anal or rectal abnormalities.      The exam was otherwise without abnormality. Impression:            - Diverticulosis in the sigmoid colon.                        - The distal rectum and anal verge are normal on                         retroflexion view.                        - The examination was otherwise normal.                        -  No specimens collected. Recommendation:        - Discharge patient to home (with escort).                        - Resume previous diet today.                        - Continue present medications.                        - Repeat colonoscopy in 10 years for screening                         purposes.                        - Resume Plavix (clopidogrel) today at prior dose.                         Refer to managing physician for further adjustment of                         therapy. Procedure Code(s):     --- Professional ---                        J4782, Colorectal cancer screening; colonoscopy on                         individual not meeting criteria for high risk Diagnosis Code(s):     --- Professional ---                        Z12.11, Encounter for screening for malignant neoplasm                         of colon                        K57.30, Diverticulosis of large intestine without                         perforation or abscess  without bleeding CPT copyright 2022 American Medical Association. All rights reserved. The codes documented in this report are preliminary and upon coder review may  be revised to meet current compliance requirements. Dr. Libby Maw Toney Reil MD, MD 01/11/2023 8:09:55 AM This report has been signed electronically. Number of Addenda: 0 Note Initiated On: 01/11/2023 6:44 AM Scope Withdrawal Time: 0 hours 8 minutes 34 seconds  Total Procedure Duration: 0 hours 12 minutes 37 seconds  Estimated Blood Loss:  Estimated blood loss: none.      [facility]

## 2023-01-11 NOTE — Anesthesia Procedure Notes (Signed)
Date/Time: 01/11/2023 7:58 AM  Performed by: Stormy Fabian, CRNAPre-anesthesia Checklist: Patient identified, Emergency Drugs available, Suction available and Patient being monitored Patient Re-evaluated:Patient Re-evaluated prior to induction Oxygen Delivery Method: Nasal cannula Induction Type: IV induction Dental Injury: Teeth and Oropharynx as per pre-operative assessment  Comments: Nasal cannula with etCO2 monitoring

## 2023-01-11 NOTE — Transfer of Care (Signed)
Immediate Anesthesia Transfer of Care Note  Patient: Patricia [name]  Procedure(s) Performed: Procedure(s): COLONOSCOPY WITH PROPOFOL (N/A)  Patient Location: PACU and Endoscopy Unit  Anesthesia Type:General  Level of Consciousness: sedated  Airway & Oxygen Therapy: Patient Spontanous Breathing and Patient connected to nasal cannula oxygen  Post-op Assessment: Report given to RN and Post -op Vital signs reviewed and stable  Post vital signs: Reviewed and stable  Last Vitals:  Vitals:   01/11/23 0809 01/11/23 0810  BP: 136/85 136/85  Pulse: 94 94  Resp: 14 14  Temp: (!) 36.3 C   SpO2: 100% 100%    Complications: No apparent anesthesia complications

## 2023-01-11 NOTE — Anesthesia Preprocedure Evaluation (Addendum)
Anesthesia Evaluation  Patient identified by MRN, date of birth, ID band Patient awake    Reviewed: Allergy & Precautions, NPO status , Patient's Chart, lab work & pertinent test results  History of Anesthesia Complications Negative for: history of anesthetic complications  Airway Mallampati: III  TM Distance: >3 FB Neck ROM: full    Dental  (+) Chipped, Poor Dentition, Missing   Pulmonary neg shortness of breath, asthma , Patient abstained from smoking., former smoker   Pulmonary exam normal        Cardiovascular Exercise Tolerance: Good hypertension, + angina  (-) Past MI Normal cardiovascular exam     Neuro/Psych CVA, Residual Symptoms  negative psych ROS   GI/Hepatic negative GI ROS, Neg liver ROS,,,  Endo/Other  negative endocrine ROS    Renal/GU Renal disease  negative genitourinary   Musculoskeletal   Abdominal   Peds  Hematology negative hematology ROS (+)   Anesthesia Other Findings Past Medical History: No date: Asthma No date: Bronchitis No date: HLD (hyperlipidemia) No date: Hypertension No date: Stroke [facility])  Past Surgical History: No date: CHOLECYSTECTOMY 04/10/2022: IR CT HEAD LTD 04/10/2022: IR CT HEAD LTD 04/10/2022: IR INTRAVSC STENT CERV CAROTID W/O EMB-PROT MOD SED INC  ANGIO 04/10/2022: IR PERCUTANEOUS ART THROMBECTOMY/INFUSION INTRACRANIAL INC  DIAG ANGIO 04/30/2022: IR RADIOLOGIST EVAL & MGMT 04/09/2022: RADIOLOGY WITH ANESTHESIA; N/A     Comment:  Procedure: RADIOLOGY WITH ANESTHESIA;  Surgeon:               Radiologist, Medication, MD;  Location: [facility] OR;  Service:               Radiology;  Laterality: N/A; No date: TUBAL LIGATION  BMI    Body Mass Index: 31.92 kg/m      Reproductive/Obstetrics negative OB ROS                             Anesthesia Physical Anesthesia Plan  ASA: 3  Anesthesia Plan: General   Post-op Pain Management:    Induction:  Intravenous  PONV Risk Score and Plan: Propofol infusion and TIVA  Airway Management Planned: Natural Airway and Nasal Cannula  Additional Equipment:   Intra-op Plan:   Post-operative Plan:   Informed Consent: I have reviewed the patients History and Physical, chart, labs and discussed the procedure including the risks, benefits and alternatives for the proposed anesthesia with the patient or authorized representative who has indicated his/her understanding and acceptance.     Dental Advisory Given  Plan Discussed with: Anesthesiologist, CRNA and Surgeon  Anesthesia Plan Comments: (Patient consented for risks of anesthesia including but not limited to:  - adverse reactions to medications - risk of airway placement if required - damage to eyes, teeth, lips or other oral mucosa - nerve damage due to positioning  - sore throat or hoarseness - Damage to heart, brain, nerves, lungs, other parts of body or loss of life  Patient voiced understanding and assent.)       Anesthesia Quick Evaluation

## 2023-01-11 NOTE — H&P (Signed)
Arlyss Repress, MD [address]  [address]  [place], [place] [address]  Main: [phone]  Fax: [phone] Pager: [phone]  Primary Care Physician:  Leanna Sato, MD Primary Gastroenterologist:  Dr. Arlyss Repress  Pre-Procedure History & Physical: HPI:  Patricia [name] is a 57 y.o. female is here for an colonoscopy.   Past Medical History:  Diagnosis Date   Asthma    Bronchitis    HLD (hyperlipidemia)    Hypertension    Stroke Patricia [name])     Past Surgical History:  Procedure Laterality Date   CHOLECYSTECTOMY     IR CT HEAD LTD  04/10/2022   IR CT HEAD LTD  04/10/2022   IR INTRAVSC STENT CERV CAROTID W/O EMB-PROT MOD SED INC ANGIO  04/10/2022   IR PERCUTANEOUS ART THROMBECTOMY/INFUSION INTRACRANIAL INC DIAG ANGIO  04/10/2022   IR RADIOLOGIST EVAL & MGMT  04/30/2022   RADIOLOGY WITH ANESTHESIA N/A 04/09/2022   Procedure: RADIOLOGY WITH ANESTHESIA;  Surgeon: Radiologist, Medication, MD;  Location: [facility] OR;  Service: Radiology;  Laterality: N/A;   TUBAL LIGATION      Prior to Admission medications   Medication Sig Start Date End Date Taking? Authorizing Provider  albuterol (PROVENTIL HFA;VENTOLIN HFA) 108 (90 Base) MCG/ACT inhaler Inhale 2 puffs into the lungs every 6 (six) hours as needed for wheezing or shortness of breath. 07/08/16  Yes Sharyn Creamer, MD  aspirin 81 MG chewable tablet Chew 1 tablet (81 mg total) by mouth daily. 04/13/22  Yes de Saintclair Halsted, Cortney E, NP  citalopram (CELEXA) 20 MG tablet Take 20 mg by mouth at bedtime. 03/17/22  Yes [provider]  clopidogrel (PLAVIX) 75 MG tablet Take 75 mg by mouth daily.   Yes [provider]  cyclobenzaprine (FLEXERIL) 10 MG tablet Take 10 mg by mouth 3 (three) times daily. 05/10/22  Yes [provider]  gabapentin (NEURONTIN) 100 MG capsule Take 100 mg by mouth 2 (two) times daily. 11/30/22 01/29/23 Yes [provider]  hydrochlorothiazide (HYDRODIURIL) 25 MG tablet Take 25 mg by mouth  daily. 03/17/22  Yes [provider]  HYDROcodone-acetaminophen (NORCO/VICODIN) 5-325 MG tablet Take 1 tablet by mouth every 6 (six) hours as needed. 11/30/22  Yes [provider]  KLOR-CON M20 20 MEQ tablet  05/03/22  Yes [provider]  NIFEdipine (ADALAT CC) 30 MG 24 hr tablet Take 30 mg by mouth daily. 03/20/22  Yes [provider]  ondansetron (ZOFRAN) 4 MG tablet Take 4 mg by mouth every 8 (eight) hours as needed for nausea. 05/09/22  Yes [provider]  Potassium Chloride ER 20 MEQ TBCR Take 1 tablet by mouth daily. 05/10/22  Yes [provider]  rosuvastatin (CRESTOR) 20 MG tablet Take 1 tablet (20 mg total) by mouth daily. 04/13/22  Yes de Saintclair Halsted, Cortney E, NP  tiZANidine (ZANAFLEX) 4 MG tablet Take 4 mg by mouth at bedtime. 11/10/22  Yes [provider]  Vitamin D, Ergocalciferol, (DRISDOL) 1.25 MG (50000 UNIT) CAPS capsule Take 50,000 Units by mouth once a week. 09/13/22  Yes [provider]  diphenhydrAMINE (BENADRYL ALLERGY) 25 mg capsule Take 1 capsule (25 mg total) by mouth every 6 (six) hours as needed for up to 5 days. 06/04/22 06/09/22  Poggi, Herb Grays, PA-C  levETIRAcetam (KEPPRA) 500 MG tablet Take 1 tablet (500 mg total) by mouth 2 (two) times daily. Patient not taking: Reported on 01/11/2023 12/21/22   Ihor Austin, NP  lidocaine (LIDODERM) 5 %  Place 1 patch onto the skin every 12 (twelve) hours. Remove & Discard patch within 12 hours or as directed by MD Patient not taking: Reported on 12/21/2022 11/01/22 11/01/23  Chesley Noon, MD    Allergies as of 12/19/2022 - Review Complete 11/01/2022  Allergen Reaction Noted   Tramadol Rash 09/29/2018    Family History  Problem Relation Age of Onset   Hyperlipidemia Mother    Hypertension Mother     Social History   Socioeconomic History   Marital status: Divorced    Spouse name: Not on file   Number of children: Not on file   Years of education: Not on file    Highest education level: Not on file  Occupational History   Not on file  Tobacco Use   Smoking status: Former    Types: Cigars    Quit date: 02/11/2021    Years since quitting: 1.9   Smokeless tobacco: Never  Substance and Sexual Activity   Alcohol use: Yes    Comment: occassionaly   Drug use: Yes    Types: Marijuana   Sexual activity: Not on file  Other Topics Concern   Not on file  Social History Narrative   Not on file   Social Determinants of Health   Financial Resource Strain: Medium Risk (05/06/2022)   Received from [name], [facility]   Overall Financial Resource Strain (CARDIA)    Difficulty of Paying Living Expenses: Somewhat hard  Food Insecurity: No Food Insecurity (04/23/2022)   Hunger Vital Sign    Worried About Running Out of Food in the Last Year: Never true    Ran Out of Food in the Last Year: Never true  Transportation Needs: No Transportation Needs (04/23/2022)   PRAPARE - Administrator, Civil Service (Medical): No    Lack of Transportation (Non-Medical): No  Physical Activity: Not on file  Stress: Not on file  Social Connections: Not on file  Intimate Partner Violence: Not At Risk (04/23/2022)   Humiliation, Afraid, Rape, and Kick questionnaire    Fear of Current or Ex-Partner: No    Emotionally Abused: No    Physically Abused: No    Sexually Abused: No    Review of Systems: See HPI, otherwise negative ROS  Physical Exam: BP (!) 147/106   Pulse 92   Temp (!) 95.7 F (35.4 C) (Temporal)   Resp 16   Ht 5\' 5"  (1.651 m)   Wt 87 kg   SpO2 98%   BMI 31.92 kg/m  General:   Alert,  pleasant and cooperative in NAD Head:  Normocephalic and atraumatic. Neck:  Supple; no masses or thyromegaly. Lungs:  Clear throughout to auscultation.    Heart:  Regular rate and rhythm. Abdomen:  Soft, nontender and nondistended. Normal bowel sounds, without guarding, and without rebound.   Neurologic:  Alert and  oriented x4;  grossly  normal neurologically.  Impression/Plan: Patricia [name] is here for an colonoscopy to be performed for colon cancer screening  Risks, benefits, limitations, and alternatives regarding  colonoscopy have been reviewed with the patient.  Questions have been answered.  All parties agreeable.   Lannette Donath, MD  01/11/2023, 7:48 AM

## 2023-01-12 ENCOUNTER — Encounter: Payer: Self-pay | Admitting: Gastroenterology

## 2023-01-31 ENCOUNTER — Emergency Department (HOSPITAL_BASED_OUTPATIENT_CLINIC_OR_DEPARTMENT_OTHER)
Admission: EM | Admit: 2023-01-31 | Discharge: 2023-01-31 | Disposition: A | Discharge status: Home / Self Care | Payer: 59 | Attending: Emergency Medicine | Admitting: Emergency Medicine

## 2023-01-31 ENCOUNTER — Other Ambulatory Visit: Payer: Self-pay

## 2023-01-31 ENCOUNTER — Encounter (HOSPITAL_BASED_OUTPATIENT_CLINIC_OR_DEPARTMENT_OTHER): Payer: Self-pay

## 2023-01-31 DIAGNOSIS — I10 Essential (primary) hypertension: Secondary | ICD-10-CM | POA: Insufficient documentation

## 2023-01-31 DIAGNOSIS — Z79899 Other long term (current) drug therapy: Secondary | ICD-10-CM | POA: Insufficient documentation

## 2023-01-31 DIAGNOSIS — Z7982 Long term (current) use of aspirin: Secondary | ICD-10-CM | POA: Insufficient documentation

## 2023-01-31 DIAGNOSIS — R42 Dizziness and giddiness: Secondary | ICD-10-CM | POA: Insufficient documentation

## 2023-01-31 DIAGNOSIS — J45909 Unspecified asthma, uncomplicated: Secondary | ICD-10-CM | POA: Diagnosis not present

## 2023-01-31 DIAGNOSIS — R112 Nausea with vomiting, unspecified: Secondary | ICD-10-CM | POA: Diagnosis not present

## 2023-01-31 DIAGNOSIS — Z7951 Long term (current) use of inhaled steroids: Secondary | ICD-10-CM | POA: Insufficient documentation

## 2023-01-31 LAB — I-STAT CHEM 8, ED
BUN: 11 mg/dL (ref 6–20)
Calcium, Ion: 1.06 mmol/L — ABNORMAL LOW (ref 1.15–1.40)
Chloride: 103 mmol/L (ref 98–111)
Creatinine, Ser: 0.9 mg/dL (ref 0.44–1.00)
Glucose, Bld: 151 mg/dL — ABNORMAL HIGH (ref 70–99)
HCT: 39 % (ref 36.0–46.0)
Hemoglobin: 13.3 g/dL (ref 12.0–15.0)
Potassium: 3.4 mmol/L — ABNORMAL LOW (ref 3.5–5.1)
Sodium: 137 mmol/L (ref 135–145)
TCO2: 26 mmol/L (ref 22–32)

## 2023-01-31 LAB — COMPREHENSIVE METABOLIC PANEL
ALT: 11 U/L (ref 0–44)
AST: 25 U/L (ref 15–41)
Albumin: 4.7 g/dL (ref 3.5–5.0)
Alkaline Phosphatase: 87 U/L (ref 38–126)
Anion gap: 11 (ref 5–15)
BUN: 12 mg/dL (ref 6–20)
CO2: 27 mmol/L (ref 22–32)
Calcium: 10.6 mg/dL — ABNORMAL HIGH (ref 8.9–10.3)
Chloride: 99 mmol/L (ref 98–111)
Creatinine, Ser: 0.85 mg/dL (ref 0.44–1.00)
GFR, Estimated: >60 mL/min (ref >60)
Glucose, Bld: 106 mg/dL — ABNORMAL HIGH (ref 70–99)
Potassium: 3.7 mmol/L (ref 3.5–5.1)
Sodium: 137 mmol/L (ref 135–145)
Total Bilirubin: 0.6 mg/dL (ref <1.2)
Total Protein: 8.1 g/dL (ref 6.5–8.1)

## 2023-01-31 LAB — CBC
HCT: 42 % (ref 36.0–46.0)
Hemoglobin: 14.8 g/dL (ref 12.0–15.0)
MCH: 32.7 pg (ref 26.0–34.0)
MCHC: 35.2 g/dL (ref 30.0–36.0)
MCV: 92.9 fL (ref 80.0–100.0)
Platelets: 269 10*3/uL (ref 150–400)
RBC: 4.52 MIL/uL (ref 3.87–5.11)
RDW: 11.7 % (ref 11.5–15.5)
WBC: 8.8 10*3/uL (ref 4.0–10.5)
nRBC: 0 % (ref 0.0–0.2)

## 2023-01-31 MED ORDER — DIPHENHYDRAMINE HCL 50 MG/ML IJ SOLN
12.5000 mg | Freq: Once | INTRAMUSCULAR | Status: AC
Start: 2023-01-31 — End: 2023-01-31
  Administered 2023-01-31: 12.5 mg via INTRAVENOUS
  Filled 2023-01-31: qty 1

## 2023-01-31 MED ORDER — KETOROLAC TROMETHAMINE 15 MG/ML IJ SOLN
15.0000 mg | Freq: Once | INTRAMUSCULAR | Status: AC
  Administered 2023-01-31: 15 mg via INTRAVENOUS
  Filled 2023-01-31: qty 1

## 2023-01-31 MED ORDER — ONDANSETRON 4 MG PO TBDP: 4.0000 mg | ORAL_TABLET | Freq: Three times a day (TID) | ORAL | 0 refills | Status: AC | PRN

## 2023-01-31 MED ORDER — METOCLOPRAMIDE HCL 5 MG/ML IJ SOLN
10.0000 mg | Freq: Once | INTRAMUSCULAR | Status: AC
  Administered 2023-01-31: 10 mg via INTRAVENOUS
  Filled 2023-01-31: qty 2

## 2023-01-31 MED ORDER — MECLIZINE HCL 25 MG PO TABS: 25.0000 mg | ORAL_TABLET | Freq: Three times a day (TID) | ORAL | 0 refills | Status: AC | PRN

## 2023-01-31 MED ORDER — POTASSIUM CHLORIDE CRYS ER 20 MEQ PO TBCR
40.0000 meq | EXTENDED_RELEASE_TABLET | Freq: Once | ORAL | Status: AC
  Administered 2023-01-31: 40 meq via ORAL
  Filled 2023-01-31: qty 2

## 2023-01-31 MED ORDER — MECLIZINE HCL 25 MG PO TABS
25.0000 mg | ORAL_TABLET | Freq: Once | ORAL | Status: AC
  Administered 2023-01-31: 25 mg via ORAL
  Filled 2023-01-31: qty 1

## 2023-01-31 MED ORDER — SODIUM CHLORIDE 0.9 % IV BOLUS
1000.0000 mL | Freq: Once | INTRAVENOUS | Status: AC
  Administered 2023-01-31: 1000 mL via INTRAVENOUS

## 2023-01-31 NOTE — ED Triage Notes (Signed)
Pt presents with complaints of elevated bp. Pt reports that she had a headache last night and woke this morning and felt dizzy. Check her bp and it was 153/100. Pt reports taking her BP meds as prescribed this meds. Endorses nausea X 2days.

## 2023-01-31 NOTE — ED Provider Notes (Signed)
[place] [facility] Provider Note   CSN: [id number] Arrival date & time: 01/31/23  1401     History  Chief Complaint  Patient presents with   Hypertension    Patricia [name] is a 57 y.o. female with a history of ischemic right MCA stroke, asthma, and hypertension who presents the ED today for multiple complaints.  Patient reports waking up with a headache this morning and feeling as if her surroundings were spinning while she was walking to her car this morning.  She checked her blood pressure and it was 153/100 at home.  She reports taking medications as prescribed.  Since this morning, patient with reports headache with associated photophobia and a lightheaded sensation that is positional in nature.  Headache is located in the center of her forehead.  Additionally, patient reports feeling nauseous for the past 2 days as well as having diarrhea.  No vision changes, focal neural deficits, inability to ambulate, or recent head injuries.  She denies fever, shortness of breath, chest pain, or abdominal pain.  No additional complaints or concerns at this time.    Home Medications Prior to Admission medications   Medication Sig Start Date End Date Taking? Authorizing Provider  meclizine (ANTIVERT) 25 MG tablet Take 1 tablet (25 mg total) by mouth 3 (three) times daily as needed for dizziness. 01/31/23 03/02/23 Yes Maxwell Marion, PA-C  ondansetron (ZOFRAN-ODT) 4 MG disintegrating tablet Take 1 tablet (4 mg total) by mouth every 8 (eight) hours as needed for nausea or vomiting. 01/31/23 03/02/23 Yes Maxwell Marion, PA-C  albuterol (PROVENTIL HFA;VENTOLIN HFA) 108 (90 Base) MCG/ACT inhaler Inhale 2 puffs into the lungs every 6 (six) hours as needed for wheezing or shortness of breath. 07/08/16   Sharyn Creamer, MD  aspirin 81 MG chewable tablet Chew 1 tablet (81 mg total) by mouth daily. 04/13/22   de Saintclair Halsted, Cortney E, NP  citalopram (CELEXA) 20 MG tablet Take 20 mg by  mouth at bedtime. 03/17/22   [provider]  clopidogrel (PLAVIX) 75 MG tablet Take 75 mg by mouth daily.    [provider]  cyclobenzaprine (FLEXERIL) 10 MG tablet Take 10 mg by mouth 3 (three) times daily. 05/10/22   [provider]  diphenhydrAMINE (BENADRYL ALLERGY) 25 mg capsule Take 1 capsule (25 mg total) by mouth every 6 (six) hours as needed for up to 5 days. 06/04/22 06/09/22  Poggi, Eileen Stanford E, PA-C  gabapentin (NEURONTIN) 100 MG capsule Take 100 mg by mouth 2 (two) times daily. 11/30/22 01/29/23  [provider]  hydrochlorothiazide (HYDRODIURIL) 25 MG tablet Take 25 mg by mouth daily. 03/17/22   [provider]  HYDROcodone-acetaminophen (NORCO/VICODIN) 5-325 MG tablet Take 1 tablet by mouth every 6 (six) hours as needed. 11/30/22   [provider]  KLOR-CON M20 20 MEQ tablet  05/03/22   [provider]  levETIRAcetam (KEPPRA) 500 MG tablet Take 1 tablet (500 mg total) by mouth 2 (two) times daily. Patient not taking: Reported on 01/11/2023 12/21/22   Ihor Austin, NP  lidocaine (LIDODERM) 5 % Place 1 patch onto the skin every 12 (twelve) hours. Remove & Discard patch within 12 hours or as directed by MD Patient not taking: Reported on 12/21/2022 11/01/22 11/01/23  Chesley Noon, MD  NIFEdipine (ADALAT CC) 30 MG 24 hr tablet Take 30 mg by mouth daily. 03/20/22   [provider]  ondansetron (ZOFRAN) 4 MG tablet Take 4 mg by mouth every 8 (  eight) hours as needed for nausea. 05/09/22   [provider]  Potassium Chloride ER 20 MEQ TBCR Take 1 tablet by mouth daily. 05/10/22   [provider]  rosuvastatin (CRESTOR) 20 MG tablet Take 1 tablet (20 mg total) by mouth daily. 04/13/22   de Saintclair Halsted, Cortney E, NP  tiZANidine (ZANAFLEX) 4 MG tablet Take 4 mg by mouth at bedtime. 11/10/22   [provider]  Vitamin D, Ergocalciferol, (DRISDOL) 1.25 MG (50000 UNIT) CAPS capsule Take 50,000 Units by mouth once a  week. 09/13/22   [provider]      Allergies    Tramadol    Review of Systems   Review of Systems  Neurological:  Positive for light-headedness and headaches.  All other systems reviewed and are negative.   Physical Exam Updated Vital Signs BP (!) 136/102   Pulse 95   Temp 98 F (36.7 C)   Resp 13   Ht 5\' 5"  (1.651 m)   Wt 81.6 kg   SpO2 99%   BMI 29.95 kg/m  Physical Exam Vitals and nursing note reviewed.  Constitutional:      Appearance: Normal appearance.  HENT:     Head: Normocephalic and atraumatic.     Mouth/Throat:     Mouth: Mucous membranes are moist.  Eyes:     Extraocular Movements: Extraocular movements intact.     Conjunctiva/sclera: Conjunctivae normal.     Pupils: Pupils are equal, round, and reactive to light.     Comments: Horizontal nystagmus on EOM  Cardiovascular:     Rate and Rhythm: Normal rate and regular rhythm.     Pulses: Normal pulses.     Heart sounds: Normal heart sounds.  Pulmonary:     Effort: Pulmonary effort is normal.     Breath sounds: Normal breath sounds.  Abdominal:     Palpations: Abdomen is soft.     Tenderness: There is no abdominal tenderness.  Musculoskeletal:        General: Normal range of motion.  Skin:    General: Skin is warm and dry.     Findings: No rash.  Neurological:     General: No focal deficit present.     Mental Status: She is alert.     Sensory: No sensory deficit.     Motor: No weakness.     Coordination: Coordination normal.     Gait: Gait normal.  Psychiatric:        Mood and Affect: Mood normal.        Behavior: Behavior normal.    ED Results / Procedures / Treatments   Labs (all labs ordered are listed, but only abnormal results are displayed) Labs Reviewed  COMPREHENSIVE METABOLIC PANEL - Abnormal; Notable for the following components:      Result Value   Glucose, Bld 106 (*)    Calcium 10.6 (*)    All other components within normal limits  I-STAT CHEM 8, ED - Abnormal;  Notable for the following components:   Potassium 3.4 (*)    Glucose, Bld 151 (*)    Calcium, Ion 1.06 (*)    All other components within normal limits  CBC    EKG EKG Interpretation Date/Time:  Tuesday January 31 2023 14:16:36 EST Ventricular Rate:  102 PR Interval:  137 QRS Duration:  71 QT Interval:  351 QTC Calculation: 458 R Axis:   -1  Text Interpretation: Sinus tachycardia RSR' in V1 or V2, probably normal variant Confirmed by  Alona Bene ([id number]) on 01/31/2023 2:20:41 PM  Radiology No results found.  Procedures Procedures: not indicated.   Medications Ordered in ED Medications  sodium chloride 0.9 % bolus 1,000 mL (0 mLs Intravenous Stopped 01/31/23 1603)  ketorolac (TORADOL) 15 MG/ML injection 15 mg (15 mg Intravenous Given 01/31/23 1442)  metoCLOPramide (REGLAN) injection 10 mg (10 mg Intravenous Given 01/31/23 1442)  diphenhydrAMINE (BENADRYL) injection 12.5 mg (12.5 mg Intravenous Given 01/31/23 1442)  meclizine (ANTIVERT) tablet 25 mg (25 mg Oral Given 01/31/23 1441)  potassium chloride SA (KLOR-CON M) CR tablet 40 mEq (40 mEq Oral Given 01/31/23 1613)    ED Course/ Medical Decision Making/ A&P                                 Medical Decision Making Amount and/or Complexity of Data Reviewed Labs: ordered.  Risk Prescription drug management.   This patient presents to the ED for concern of headache and lightheadedness, this involves an extensive number of treatment options, and is a complaint that carries with it a high risk of complications and morbidity.   Differential diagnosis includes: hypertensive urgency, dehydration, electrolyte derangement, BPPV, Mnire's disease, vestibular neuritis, viral illness, cluster headache, tension headache, migraine, atypical migraine, etc. Low suspicion for stroke due to lack of neurologic deficits.   Comorbidities  See HPI above   Additional History  Additional history obtained from prior records.   Cardiac  Monitoring / EKG  The patient was maintained on a cardiac monitor.  I personally viewed and interpreted the cardiac monitored which showed: sinus tachycardia with a heart rate of 102 bpm.   Lab Tests  I ordered and personally interpreted labs.  The pertinent results include:   CBC is unremarkable   Problem List / ED Course / Critical Interventions / Medication Management  Lightheadedness, headache, and high blood pressure I ordered medications including: Toradol, Reglan, Benadryl, and NS for headache Meclizine for dizziness  Reevaluation of the patient after these medicines showed that the patient resolved. Patient saying that her dizziness and headache went away.  Her blood pressure was 160/106 at the time, about the same as when she came in today. Patient states that she thinks her symptoms could be related to recently having her nifedipine increased from 30 to 90 mg on 11/25. Nystagmus improved after Meclizine and headache cocktail. My attending, Dr. Jearld Fenton, evaluated patient as well. Patient states that she can get in today to see her PCP for blood pressure management. I have reviewed the patients home medicines and have made adjustments as needed   Social Determinants of Health  Tobacco use   Test / Admission - Considered  Discussed findings with patient.  She is stable and safe for discharge home. Prescriptions for Meclizine and Zofran sent to the pharmacy. Return precautions provided.        Final Clinical Impression(s) / ED Diagnoses Final diagnoses:  Hypertension, unspecified type  Lightheadedness  Nausea and vomiting, unspecified vomiting type    Rx / DC Orders ED Discharge Orders          Ordered    ondansetron (ZOFRAN-ODT) 4 MG disintegrating tablet  Every 8 hours PRN        01/31/23 1721    meclizine (ANTIVERT) 25 MG tablet  3 times daily PRN        01/31/23 1721              Maxwell Marion, PA-C  01/31/23 1725    Loetta Rough,  MD 01/31/23 [phone]

## 2023-01-31 NOTE — Discharge Instructions (Signed)
As discussed, your labs are reassuring.  Will send a prescription of meclizine to the pharmacy.  You can take this up to 3 times a day as needed for dizziness.  Will send a prescription for Zofran.  It is under the tongue dissolvable medication that you can take for nausea and vomiting up to 3 times a day as well.  Follow-up with your primary care provider in regards to your blood pressure.  Get help right away if: You vomit or have watery poop (diarrhea), and you cannot eat or drink anything. You have trouble: Talking. Walking. Swallowing. Using your arms, hands, or legs. You feel generally weak. You are not thinking clearly, or you have trouble forming sentences. A friend or family member may notice this. You have: Chest pain. Pain in your belly (abdomen). Shortness of breath. Sweating. Your vision changes. You are bleeding. You have a very bad headache.

## 2023-01-31 NOTE — ED Notes (Signed)
Pt alert and oriented X 4 at the time of discharge. RR even and unlabored. No acute distress noted. Pt verbalized understanding of discharge instructions as discussed. Pt ambulatory to lobby at time of discharge.

## 2023-03-17 ENCOUNTER — Encounter: Payer: Self-pay | Admitting: Orthopedic Surgery

## 2023-03-17 ENCOUNTER — Other Ambulatory Visit: Payer: Self-pay | Admitting: Orthopedic Surgery

## 2023-03-17 DIAGNOSIS — G8929 Other chronic pain: Secondary | ICD-10-CM

## 2023-03-17 DIAGNOSIS — M7552 Bursitis of left shoulder: Secondary | ICD-10-CM

## 2023-03-17 DIAGNOSIS — M25312 Other instability, left shoulder: Secondary | ICD-10-CM

## 2023-03-27 ENCOUNTER — Encounter: Payer: Self-pay | Admitting: Orthopedic Surgery

## 2023-04-04 ENCOUNTER — Ambulatory Visit
Admission: RE | Admit: 2023-04-04 | Discharge: 2023-04-04 | Disposition: A | Discharge status: Home / Self Care | Payer: 59 | Source: Ambulatory Visit | Attending: Orthopedic Surgery | Admitting: Orthopedic Surgery

## 2023-04-04 DIAGNOSIS — M25312 Other instability, left shoulder: Secondary | ICD-10-CM

## 2023-04-04 DIAGNOSIS — G8929 Other chronic pain: Secondary | ICD-10-CM

## 2023-04-04 DIAGNOSIS — M7552 Bursitis of left shoulder: Secondary | ICD-10-CM

## 2023-05-22 ENCOUNTER — Telehealth: Payer: Self-pay | Admitting: Adult Health

## 2023-05-22 NOTE — Telephone Encounter (Signed)
 [facility] )Having dental procedure the surgeon want to stop clopidogrel (PLAVIX) 75 MG tablet and aspirin 81 MG chewable tablet. If want patient stop for procedure would you want patient to start aLovenox

## 2023-05-22 NOTE — Telephone Encounter (Signed)
 Spoke with [place] regarding the question of holding the ASA and Plavix for a dental procedure. Discussed from a neurological standpoint it is ok for the procedure and we usually recommend holding both medications for 3-5 days prior to the procedure without bridge coverage of Lovenox following the procedure. But because we are not the prescribing provider, clearance to hold the ASA and Plavix will need to come from IR because they are the prescriber. IR would also need to indicate if Lovenox is needed post procedure. [place] stated understanding and that she will make dentist aware.

## 2023-07-03 ENCOUNTER — Encounter: Payer: Self-pay | Admitting: Adult Health

## 2023-07-03 ENCOUNTER — Ambulatory Visit (INDEPENDENT_AMBULATORY_CARE_PROVIDER_SITE_OTHER): Payer: 59 | Admitting: Adult Health

## 2023-07-03 VITALS — BP 143/98 | HR 109 | Ht 65.0 in | Wt 190.0 lb

## 2023-07-03 DIAGNOSIS — I69398 Other sequelae of cerebral infarction: Secondary | ICD-10-CM | POA: Diagnosis not present

## 2023-07-03 DIAGNOSIS — I63511 Cerebral infarction due to unspecified occlusion or stenosis of right middle cerebral artery: Secondary | ICD-10-CM

## 2023-07-03 DIAGNOSIS — R569 Unspecified convulsions: Secondary | ICD-10-CM

## 2023-07-03 MED ORDER — LEVETIRACETAM 500 MG PO TABS: 500.0000 mg | ORAL_TABLET | Freq: Two times a day (BID) | ORAL | 3 refills | Status: AC

## 2023-07-03 NOTE — Patient Instructions (Addendum)
 Continue Keppra  500mg  twice daily for seizure prevention   Continue aspirin  81 mg daily and clopidogrel  75 mg daily  and Crestor   for secondary stroke prevention  Ensure follow up visit with Dr. Alvira Josephs for repeat imaging - please call their office to schedule if you do not receive a call by the end of next week  Can try Magnesium  glycinate to help with muscle cramps - continue to follow with your PCP for ongoing monitoring and management   Continue to follow up with PCP regarding blood pressure and cholesterol management  Maintain strict control of hypertension with blood pressure goal below 130/90, diabetes with hemoglobin A1c goal below 7.0 % and cholesterol with LDL cholesterol (bad cholesterol) goal below 70 mg/dL.   Signs of a Stroke? Follow the BEFAST method:  Balance Watch for a sudden loss of balance, trouble with coordination or vertigo Eyes Is there a sudden loss of vision in one or both eyes? Or double vision?  Face: Ask the person to smile. Does one side of the face droop or is it numb?  Arms: Ask the person to raise both arms. Does one arm drift downward? Is there weakness or numbness of a leg? Speech: Ask the person to repeat a simple phrase. Does the speech sound slurred/strange? Is the person confused ? Time: If you observe any of these signs, call 911.      Followup in the future with me in 9 months or call earlier if needed      Thank you for coming to see us  at [facility]. I hope we have been able to provide you high quality care today.  You may receive a patient satisfaction survey over the next few weeks. We would appreciate your feedback and comments so that we may continue to improve ourselves and the health of our patients.

## 2023-07-03 NOTE — Progress Notes (Signed)
 [facility] [address] [place]. [place] [address] [phone]       OFFICE FOLLOW-UP NOTE  [name] Date of Birth:  1966/02/03 Medical Record Number:  [id number]    Primary neurologist: Dr. Janett [name] Reason for visit: stroke and seizure   Chief Complaint  Patient presents with   Cerebrovascular Accident    Rm 3 alone Pt is well and stable, reports no new stroke or sz concerns since last visit.       HPI:   Update 07/03/2023 JM: Patient returns for follow-up visit.  Overall has been doing well. Denies any additional seizure activity.  Reports compliance on Keppra  500 mg twice daily without side effects.  No new stroke/TIA symptoms.  Remains on aspirin  and Plavix  as well as Crestor  without side effects.  Routinely follows with PCP Dr. Annabell [name] for stroke risk factor management.  She was seen by Dr. Alvira [name] with repeat imaging 11/2022 which showed patent right ICA stent, recommended 31-month follow-up but has not yet had f/u visit.   She does mention nocturnal LE spasms, feels like charley horse sensation.  On muscle relaxant per PCP but denies much benefit.  She plans on scheduling follow-up visit with PCP for further evaluation.     History provided for reference purposes only Update 12/21/2022 JM: Patient returns for scheduled follow-up visit accompanied by her son.   She was seen in the ED on 5/4 for a generalized tonic-clonic seizure.  No clear cause identified, labs largely unremarkable, CTH no acute abnormality.  Denies any missed Keppra  dosages.  No medication adjustments recommended, continued on Keppra  500 mg twice daily.  Return to ED on 5/9 after feeling weak, lightheaded and shaky.  She took her Keppra  as she had not taken it yet that morning.  Reports similar symptoms that preceded prior seizures, no actual seizure activity.  Lab work largely unremarkable except mild hypokalemia already on supplement, CTH no acute abnormality.  She also  endorsed increased anxiety and concern of possible panic attack, advised outpatient follow-up with PCP.   Denies any additional seizures since that time, remains on keppra  500mg  twice daily, denies side effects. EEG previously completed on 5/1 which was normal.   Stable from stroke standpoint without new stroke/TIA symptoms. Does still have some mild left hand weakness but otherwise denies any residual deficits. Reports she has been out of work since her stroke, currently applying for Social Security disability.   Does c/o left arm pain over the past several months, currently being followed by ortho, recently started PT but is concerned that PT is primarily focusing on neck causing pain but she believes the pain is coming from her shoulder/arm. She was initially having some more pain but no longer experiencing this.  This has been impacting her sleep and daily activity.  She has a f/u visit with PT this afternoon.   Remains on aspirin  and Crestor .  She was transitioned from brilinta  to plavix  by PCP as unable to get refill of Brilinta  after first refill (since around March), she has not had any follow up with with IR. She questions holding medications for colonoscopy scheduled on 11/13. Routinely follows with PCP for stroke risk factor management  Initial visit 06/15/2022 Dr. Janett [name]: 58 year old lady seen today for initial office visit following hospital admission for stroke in February 2024.  History is obtained from the patient and review of electronic medical records and I personally reviewed pertinent available imaging films in PACS.  He has past medical  history of hypertension, hyperlipidemia, asthma and bronchitis.  He presented on 04/09/2022 with sudden onset of left arm numbness.  EMS activated a code stroke.  On admission she had a manage stroke scale of 14 with right gaze deviation and left hemiplegia.  Received IV TNK.  CT head showed dense right M1 sign and CT angiogram showed acute occlusion of  the distal right M1 with transient filling defect at the ICA just distal to the bifurcation suggestive of carotid web.  She underwent emergent mechanical thrombectomy with TICI 3 revascularization of the right M1 and placement of rescue right proximal ICA stent.  The carotid vessels.  She did very well and was extubated and made gradual improvement.  2D echo showed normal ejection fraction of 60 to 65%.  LDL cholesterol was 81 mg percent.  Hemoglobin A1c was 5.1.  Urine drug screen was positive for marijuana.  Patient had a small right groin pseudoaneurysm with resolved left manual compression.  She was placed on aspirin  and Brilinta  and Crestor .  She was counseled to quit smoking cigarettes and use marijuana.  Patient states is done very well and almost full neurological recovery without recurrent stroke or TIA symptoms.  However she was seen in the ER on 04/26/2022 by Dr. Alecia [name] and treated the left episode of becoming flushed with sweating and lightheadedness with brief loss of consciousness with stiffening and shaking of upper extremities lasting 15 seconds.  This happened after she had developed a gastrointestinal illness.  She had 2 such episodes while she was in a seated position.  There was no tongue bite or incontinence.  EEG was obtained which showed right posterior sharp waves with phase reversal.  He was started on Keppra  for seizure prophylaxis.  He has had no further such episodes since then.  He states he is able to resume all activities of daily living.  Occasionally she feels that the left leg drags when she is tired.  She also gets some occasional cramps in the left hand.  She has no new complaints today.     ROS:   14 system review of systems is positive for those noted in HPI and all other systems negative  PMH:  Past Medical History:  Diagnosis Date   Asthma    Bronchitis    HLD (hyperlipidemia)    Hypertension    Stroke [facility])     Social History:  Social History    Socioeconomic History   Marital status: Divorced    Spouse name: Not on file   Number of children: Not on file   Years of education: Not on file   Highest education level: Not on file  Occupational History   Not on file  Tobacco Use   Smoking status: Former    Types: Cigars    Quit date: 02/11/2021    Years since quitting: 2.3   Smokeless tobacco: Never  Substance and Sexual Activity   Alcohol use: Yes    Comment: occassionaly   Drug use: Yes    Types: Marijuana   Sexual activity: Not on file  Other Topics Concern   Not on file  Social History Narrative   Not on file   Social Drivers of Health   Financial Resource Strain: High Risk (05/22/2023)   [facility]   Overall Financial Resource Strain (CARDIA)    Difficulty of Paying Living Expenses: Very hard  Food Insecurity: Food Insecurity Present (05/22/2023)   Received from [facility]  Hunger Vital Sign    Worried About Running Out of Food in the Last Year: Often true    Ran Out of Food in the Last Year: Often true  Transportation Needs: No Transportation Needs (05/22/2023)   Received from [facility]    In the past 12 months, has lack of transportation kept you from medical appointments or from getting medications?: No    Lack of Transportation (Non-Medical): No  Physical Activity: Not on file  Stress: Not on file  Social Connections: Not on file  Intimate Partner Violence: Not At Risk (04/23/2022)   Humiliation, Afraid, Rape, and Kick questionnaire    Fear of Current or Ex-Partner: No    Emotionally Abused: No    Physically Abused: No    Sexually Abused: No    Medications:   Current Outpatient Medications on File Prior to Visit  Medication Sig Dispense Refill   albuterol  (PROVENTIL  HFA;VENTOLIN  HFA) 108 (90 Base) MCG/ACT inhaler Inhale 2 puffs into the lungs every 6 (six) hours as needed for wheezing or shortness of  breath. 1 Inhaler 2   aspirin  81 MG chewable tablet Chew 1 tablet (81 mg total) by mouth daily. 30 tablet 2   citalopram  (CELEXA ) 20 MG tablet Take 20 mg by mouth at bedtime.     clopidogrel  (PLAVIX ) 75 MG tablet Take 75 mg by mouth daily.     cyclobenzaprine  (FLEXERIL ) 10 MG tablet Take 10 mg by mouth 3 (three) times daily.     diphenhydrAMINE  (BENADRYL  ALLERGY) 25 mg capsule Take 1 capsule (25 mg total) by mouth every 6 (six) hours as needed for up to 5 days. 10 capsule 0   gabapentin (NEURONTIN) 100 MG capsule Take 100 mg by mouth 2 (two) times daily.     hydrochlorothiazide  (HYDRODIURIL ) 25 MG tablet Take 25 mg by mouth daily.     KLOR-CON  M20 20 MEQ tablet      lidocaine  (LIDODERM ) 5 % Place 1 patch onto the skin every 12 (twelve) hours. Remove & Discard patch within 12 hours or as directed by MD 10 patch 0   NIFEdipine (ADALAT CC) 30 MG 24 hr tablet Take 30 mg by mouth daily.     ondansetron  (ZOFRAN ) 4 MG tablet Take 4 mg by mouth every 8 (eight) hours as needed for nausea.     Potassium Chloride  ER 20 MEQ TBCR Take 1 tablet by mouth daily.     rosuvastatin  (CRESTOR ) 20 MG tablet Take 1 tablet (20 mg total) by mouth daily. 30 tablet 2   tiZANidine (ZANAFLEX) 4 MG tablet Take 4 mg by mouth at bedtime.     Vitamin D , Ergocalciferol , (DRISDOL ) 1.25 MG (50000 UNIT) CAPS capsule Take 50,000 Units by mouth once a week.     No current facility-administered medications on file prior to visit.    Allergies:   Allergies  Allergen Reactions   Tramadol  Rash    Physical Exam Today's Vitals   07/03/23 1039 07/03/23 1043  BP: (!) 142/102 (!) 143/98  Pulse: (!) 112 (!) 109  Weight: 190 lb (86.2 kg)   Height: 5\' 5"  (1.651 m)     Body mass index is 31.62 kg/m.   General: well developed, well nourished pleasant middle-aged lady, seated, in no evident distress  Neurologic Exam Mental Status: Awake and fully alert. Oriented to place and time. Recent and remote memory intact. Attention  span, concentration and fund of knowledge appropriate. Mood and affect appropriate.  Cranial  Nerves: Pupils equal, briskly reactive to light. Extraocular movements full without nystagmus. Visual fields full to confrontation. Hearing intact. Facial sensation intact. Face, tongue, palate moves normally and symmetrically.  Motor: Normal bulk and tone. Normal strength in all tested extremity muscles.  Diminished fine finger movements on the left.  Orbits right over left upper extremity. Sensory.: intact to touch ,pinprick .position and vibratory sensation.  Coordination: Rapid alternating movements normal in all extremities. Finger-to-nose and heel-to-shin performed accurately bilaterally. Gait and Station: Arises from chair without difficulty. Stance is normal. Gait demonstrates normal stride length and balance . Able to heel, toe and tandem walk without difficulty.  Reflexes: 1+ and symmetric. Toes downgoing.        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  Vascular risk factors of mild hyperlipidemia, hypertension and carotid web. 2 episodes of brief altered consciousness possibly syncopal episodes versus complex partial seizures on 04/26/2022, ED eval 5/4 with witnessed generalized tonic-clonic seizure activity without clear cause and ED eval 5/9 for lightheadedness/shakiness sensation which she feels prior to seizure but not actual seizure activity possibly in setting of panic attack     1.  Right MCA stroke  -Residual deficits: Mild left hand weakness with decreased dexterity. -Continue aspirin  and Crestor  for secondary stroke prevention measures managed/prescribed by PCP  -Continue to follow with PCP for aggressive stroke risk factor management  2.  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 S/p R ICA stent  -Repeat CTA 11/2022 patent stent per IR  -IR ec'd repeat imaging around 05/2023 - per patient, has not yet received a call to schedule. Will reach out to IR for further assistance, patient also encouraged to call their office if she does not receive a call by next week -remains on Plavix  in addition to aspirin  for IR recommendations (previously unable to refill Brilinta  therefore transitioned to Plavix ) - further discussed with IR PA who recommends ongoing use of DAPT post stent.        Follow-up in 9 months or call earlier if needed    I spent 30 minutes of face-to-face and non-face-to-face time with patient.  This included previsit chart review, lab review, study review, order entry, electronic health record documentation, patient education and discussion regarding above diagnoses and treatment plan and answered all other questions to patient's satisfaction  Patricia [name], [facility]  [facility] [address] [place], [place] [address]  Phone [phone] Fax [phone] Note: This document was prepared with digital dictation and possible smart phrase technology. Any transcriptional errors that result from this process are unintentional.

## 2023-07-06 ENCOUNTER — Telehealth: Payer: Self-pay

## 2023-07-06 NOTE — Telephone Encounter (Signed)
 Medical clearance received from [facility].  Patient will be scheduled to have oral surgery to extract fixating teeth.  They are requesting to sedate patient from a neurological standpoint.  Patient is currently taking aspirin  81 mg and Plavix  75 mg daily for secondary stroke prevention.  From a neurological standpoint, are you okay with patient being sedated? They did not request to hold any medications. Form placed on NP desk for review and signature

## 2023-07-06 NOTE — Telephone Encounter (Signed)
 Okay to use sedation from neurological standpoint for dental procedure.  Please ensure to avoid hypotension to ensure adequate perfusion.

## 2023-07-06 NOTE — Telephone Encounter (Signed)
 Form signed, faxed back to [facility] [place] location at [phone]. Confirmation received.

## 2023-07-20 ENCOUNTER — Telehealth (HOSPITAL_COMMUNITY): Payer: Self-pay

## 2023-07-20 ENCOUNTER — Other Ambulatory Visit (HOSPITAL_COMMUNITY): Payer: Self-pay | Admitting: Interventional Radiology

## 2023-07-20 DIAGNOSIS — I63511 Cerebral infarction due to unspecified occlusion or stenosis of right middle cerebral artery: Secondary | ICD-10-CM

## 2023-07-20 NOTE — Telephone Encounter (Signed)
Called to schedule cta head/neck, no answer, left vm. AB  

## 2023-08-03 ENCOUNTER — Other Ambulatory Visit (HOSPITAL_BASED_OUTPATIENT_CLINIC_OR_DEPARTMENT_OTHER): Payer: Self-pay

## 2023-08-07 ENCOUNTER — Other Ambulatory Visit (HOSPITAL_COMMUNITY)

## 2023-08-07 ENCOUNTER — Ambulatory Visit: Admission: RE | Admit: 2023-08-07 | Source: Ambulatory Visit

## 2023-08-25 ENCOUNTER — Encounter (HOSPITAL_COMMUNITY): Payer: Self-pay | Admitting: Interventional Radiology

## 2023-09-07 ENCOUNTER — Telehealth (HOSPITAL_COMMUNITY): Payer: Self-pay

## 2023-09-07 ENCOUNTER — Ambulatory Visit: Admission: RE | Admit: 2023-09-07 | Source: Ambulatory Visit

## 2023-09-07 NOTE — Telephone Encounter (Signed)
 Called pt again to tell her that cta has been cancelled due to insurance denial. Left vm for her to return call. AB

## 2023-09-07 NOTE — Telephone Encounter (Signed)
 Called to cancel cta, no answer, left vm. AB

## 2023-09-13 ENCOUNTER — Emergency Department
Admission: EM | Admit: 2023-09-13 | Discharge: 2023-09-14 | Disposition: A | Discharge status: Home / Self Care | Attending: Emergency Medicine | Admitting: Emergency Medicine

## 2023-09-13 ENCOUNTER — Other Ambulatory Visit: Payer: Self-pay

## 2023-09-13 ENCOUNTER — Emergency Department

## 2023-09-13 DIAGNOSIS — M79672 Pain in left foot: Secondary | ICD-10-CM | POA: Insufficient documentation

## 2023-09-13 DIAGNOSIS — Z7902 Long term (current) use of antithrombotics/antiplatelets: Secondary | ICD-10-CM | POA: Diagnosis not present

## 2023-09-13 DIAGNOSIS — M7989 Other specified soft tissue disorders: Secondary | ICD-10-CM | POA: Diagnosis not present

## 2023-09-13 LAB — CBC WITH DIFFERENTIAL/PLATELET
Abs Immature Granulocytes: 0.03 K/uL (ref 0.00–0.07)
Basophils Absolute: 0 K/uL (ref 0.0–0.1)
Basophils Relative: 1 %
Eosinophils Absolute: 0 K/uL (ref 0.0–0.5)
Eosinophils Relative: 1 %
HCT: 39.4 % (ref 36.0–46.0)
Hemoglobin: 13.3 g/dL (ref 12.0–15.0)
Immature Granulocytes: 0 %
Lymphocytes Relative: 45 %
Lymphs Abs: 3.8 K/uL (ref 0.7–4.0)
MCH: 32.6 pg (ref 26.0–34.0)
MCHC: 33.8 g/dL (ref 30.0–36.0)
MCV: 96.6 fL (ref 80.0–100.0)
Monocytes Absolute: 0.7 K/uL (ref 0.1–1.0)
Monocytes Relative: 8 %
Neutro Abs: 3.8 K/uL (ref 1.7–7.7)
Neutrophils Relative %: 45 %
Platelets: 342 K/uL (ref 150–400)
RBC: 4.08 MIL/uL (ref 3.87–5.11)
RDW: 12.1 % (ref 11.5–15.5)
WBC: 8.4 K/uL (ref 4.0–10.5)
nRBC: 0 % (ref 0.0–0.2)

## 2023-09-13 LAB — BASIC METABOLIC PANEL WITH GFR
Anion gap: 10 (ref 5–15)
BUN: 13 mg/dL (ref 6–20)
CO2: 24 mmol/L (ref 22–32)
Calcium: 9 mg/dL (ref 8.9–10.3)
Chloride: 104 mmol/L (ref 98–111)
Creatinine, Ser: 0.8 mg/dL (ref 0.44–1.00)
GFR, Estimated: >60 mL/min (ref >60)
Glucose, Bld: 124 mg/dL — ABNORMAL HIGH (ref 70–99)
Potassium: 3.6 mmol/L (ref 3.5–5.1)
Sodium: 138 mmol/L (ref 135–145)

## 2023-09-13 NOTE — ED Triage Notes (Signed)
 Pt reports left foot swelling and left knee swelling and pain that began a few days ago. Pt denies known injury.

## 2023-09-13 NOTE — ED Provider Notes (Signed)
 [facility] Provider Note    Event Date/Time   First MD Initiated Contact with Patient 09/13/23 2341     (approximate)   History   Foot Pain   HPI Xiamara FONNIE CROOKSHANKS is a 58 y.o. female presenting today for foot pain and leg swelling.  Patient states 2 weeks ago she noted pain beginning on her right foot.  Denied any traumatic injuries.  Pain has been going up the leg along with swelling of her left lower extremity now up to her knee.  Denies any redness or pain in the calf.  Denies any fevers or chills.  No shortness of breath.  Has a history of CVA and currently on Plavix  but no other blood thinners.     Physical Exam   Triage Vital Signs: ED Triage Vitals  Encounter Vitals Group     BP 09/13/23 2030 (!) 150/101     Girls Systolic BP Percentile --      Girls Diastolic BP Percentile --      Boys Systolic BP Percentile --      Boys Diastolic BP Percentile --      Pulse Rate 09/13/23 2030 100     Resp 09/13/23 2030 18     Temp 09/13/23 2030 99 F (37.2 C)     Temp src --      SpO2 09/13/23 2030 98 %     Weight 09/13/23 2042 191 lb (86.6 kg)     Height 09/13/23 2042 5' 5 (1.651 m)     Head Circumference --      Peak Flow --      Pain Score 09/13/23 2042 9     Pain Loc --      Pain Education --      Exclude from Growth Chart --     Most recent vital signs: Vitals:   09/13/23 2030  BP: (!) 150/101  Pulse: 100  Resp: 18  Temp: 99 F (37.2 C)  SpO2: 98%   I have reviewed the vital signs. General:  Awake, alert, no acute distress. Head:  Normocephalic, Atraumatic. EENT:  PERRL, EOMI, Oral mucosa pink and moist, Neck is supple. Cardiovascular: Regular rate, 2+ distal pulses. Respiratory:  Normal respiratory effort, symmetrical expansion, no distress.   Extremities:  Moving all four extremities through full ROM without pain.  Mild tenderness palpation throughout the left foot without obvious swelling noted in comparison to the right  side. Neuro:  Alert and oriented.  Interacting appropriately.   Skin:  Warm, dry, no rash.   Psych: Appropriate affect.    ED Results / Procedures / Treatments   Labs (all labs ordered are listed, but only abnormal results are displayed) Labs Reviewed  BASIC METABOLIC PANEL WITH GFR - Abnormal; Notable for the following components:      Result Value   Glucose, Bld 124 (*)    All other components within normal limits  CBC WITH DIFFERENTIAL/PLATELET     EKG    RADIOLOGY Independently interpreted x-ray of left knee and left foot as well as ultrasound of left lower extremity which were all negative   PROCEDURES:  Critical Care performed: No  Procedures   MEDICATIONS ORDERED IN ED: Medications - No data to display   IMPRESSION / MDM / ASSESSMENT AND PLAN / ED COURSE  I reviewed the triage vital signs and the nursing notes.  Differential diagnosis includes, but is not limited to, DVT, gout, soft tissue inflammation, lower suspicion for osseous injuries  Patient's presentation is most consistent with acute complicated illness / injury requiring diagnostic workup.  Patient is a 59 year old female presenting today for pain originating in her left foot now traveling up her leg with reported swelling.  On my exam, I do not appreciate obvious swelling of the left lower extremity in comparison to the right but patient does feel it this way.  No erythema suggestive of cellulitis.  X-rays ordered in triage of the left foot and left knee showed no acute osseous abnormalities.  Laboratory workup with CBC and BMP is negative.  Given her concern of swelling along with prior history of strokes for which she is only on antiplatelets, we will get ultrasound to rule out DVT.  Ultrasound shows no evidence of DVT.  Given most of her pain is around the great toe on the left side, highest concern may be for gout or inflammation.  She cannot take NSAIDs given she is on  Plavix .  Will start her on a short course of prednisone  and recommended Tylenol  use at home.  Told to follow-up with PCP and given strict return precautions.     FINAL CLINICAL IMPRESSION(S) / ED DIAGNOSES   Final diagnoses:  Left foot pain     Rx / DC Orders   ED Discharge Orders          Ordered    predniSONE  (DELTASONE ) 10 MG tablet  Daily        09/14/23 0215             Note:  This document was prepared using Dragon voice recognition software and may include unintentional dictation errors.   Malvina Alm DASEN, MD 09/14/23 [phone]

## 2023-09-14 ENCOUNTER — Emergency Department

## 2023-09-14 MED ORDER — PREDNISONE 10 MG PO TABS
40.0000 mg | ORAL_TABLET | Freq: Every day | ORAL | 0 refills | Status: AC
Start: 2023-09-15 — End: 2023-09-19

## 2023-09-14 NOTE — Discharge Instructions (Signed)
 X-rays of your left foot and left knee are negative at this time.  Ultrasound shows no evidence of blood clot.  This may represent inflammation or possible gout given pain around the great toe.  Otherwise your labs are reassuring at this time.  You can take Tylenol  650 mg every 6 hours for the next 5 days and I have also prescribed a steroid for you to take for 4 days to decrease inflammation.  Please follow-up with your primary care provider as needed and return for any worsening or severe symptoms.

## 2023-12-14 ENCOUNTER — Emergency Department

## 2023-12-14 ENCOUNTER — Observation Stay
Admission: EM | Admit: 2023-12-14 | Discharge: 2023-12-16 | Disposition: A | Discharge status: Home Health | Attending: Internal Medicine | Admitting: Internal Medicine

## 2023-12-14 ENCOUNTER — Encounter: Payer: Self-pay | Admitting: Emergency Medicine

## 2023-12-14 ENCOUNTER — Other Ambulatory Visit: Payer: Self-pay

## 2023-12-14 DIAGNOSIS — R918 Other nonspecific abnormal finding of lung field: Secondary | ICD-10-CM | POA: Diagnosis not present

## 2023-12-14 DIAGNOSIS — Z7982 Long term (current) use of aspirin: Secondary | ICD-10-CM | POA: Diagnosis not present

## 2023-12-14 DIAGNOSIS — Z79899 Other long term (current) drug therapy: Secondary | ICD-10-CM | POA: Insufficient documentation

## 2023-12-14 DIAGNOSIS — I10 Essential (primary) hypertension: Secondary | ICD-10-CM | POA: Diagnosis not present

## 2023-12-14 DIAGNOSIS — E785 Hyperlipidemia, unspecified: Secondary | ICD-10-CM

## 2023-12-14 DIAGNOSIS — R200 Anesthesia of skin: Principal | ICD-10-CM

## 2023-12-14 DIAGNOSIS — G459 Transient cerebral ischemic attack, unspecified: Principal | ICD-10-CM | POA: Diagnosis present

## 2023-12-14 DIAGNOSIS — Z87891 Personal history of nicotine dependence: Secondary | ICD-10-CM | POA: Diagnosis not present

## 2023-12-14 DIAGNOSIS — F10920 Alcohol use, unspecified with intoxication, uncomplicated: Secondary | ICD-10-CM | POA: Insufficient documentation

## 2023-12-14 DIAGNOSIS — R202 Paresthesia of skin: Secondary | ICD-10-CM | POA: Diagnosis present

## 2023-12-14 DIAGNOSIS — J45909 Unspecified asthma, uncomplicated: Secondary | ICD-10-CM

## 2023-12-14 DIAGNOSIS — F32A Depression, unspecified: Secondary | ICD-10-CM | POA: Diagnosis present

## 2023-12-14 LAB — DIFFERENTIAL
Abs Immature Granulocytes: 0.03 K/uL (ref 0.00–0.07)
Basophils Absolute: 0 K/uL (ref 0.0–0.1)
Basophils Relative: 1 %
Eosinophils Absolute: 0.1 K/uL (ref 0.0–0.5)
Eosinophils Relative: 1 %
Immature Granulocytes: 1 %
Lymphocytes Relative: 39 %
Lymphs Abs: 2.6 K/uL (ref 0.7–4.0)
Monocytes Absolute: 0.5 K/uL (ref 0.1–1.0)
Monocytes Relative: 7 %
Neutro Abs: 3.4 K/uL (ref 1.7–7.7)
Neutrophils Relative %: 51 %

## 2023-12-14 LAB — CBC
HCT: 35 % — ABNORMAL LOW (ref 36.0–46.0)
Hemoglobin: 11.8 g/dL — ABNORMAL LOW (ref 12.0–15.0)
MCH: 32.4 pg (ref 26.0–34.0)
MCHC: 33.7 g/dL (ref 30.0–36.0)
MCV: 96.2 fL (ref 80.0–100.0)
Platelets: 265 K/uL (ref 150–400)
RBC: 3.64 MIL/uL — ABNORMAL LOW (ref 3.87–5.11)
RDW: 11.8 % (ref 11.5–15.5)
WBC: 6.7 K/uL (ref 4.0–10.5)
nRBC: 0 % (ref 0.0–0.2)

## 2023-12-14 LAB — COMPREHENSIVE METABOLIC PANEL WITH GFR
ALT: 15 U/L (ref 0–44)
AST: 42 U/L — ABNORMAL HIGH (ref 15–41)
Albumin: 3.9 g/dL (ref 3.5–5.0)
Alkaline Phosphatase: 95 U/L (ref 38–126)
Anion gap: 8 (ref 5–15)
BUN: 15 mg/dL (ref 6–20)
CO2: 28 mmol/L (ref 22–32)
Calcium: 8.8 mg/dL — ABNORMAL LOW (ref 8.9–10.3)
Chloride: 100 mmol/L (ref 98–111)
Creatinine, Ser: 1.01 mg/dL — ABNORMAL HIGH (ref 0.44–1.00)
GFR, Estimated: >60 mL/min (ref >60)
Glucose, Bld: 89 mg/dL (ref 70–99)
Potassium: 3.7 mmol/L (ref 3.5–5.1)
Sodium: 136 mmol/L (ref 135–145)
Total Bilirubin: 0.6 mg/dL (ref 0.0–1.2)
Total Protein: 7.3 g/dL (ref 6.5–8.1)

## 2023-12-14 LAB — APTT: aPTT: 32 s (ref 24–36)

## 2023-12-14 LAB — PROTIME-INR
INR: 0.8 (ref 0.8–1.2)
Prothrombin Time: 12.1 s (ref 11.4–15.2)

## 2023-12-14 LAB — ETHANOL: Alcohol, Ethyl (B): <15 mg/dL (ref <15)

## 2023-12-14 NOTE — ED Triage Notes (Signed)
 Pt via POV from home. Pt c/o R sided arm and leg numbness that started yesterday around 0900, hx of stroke in Feb 2024. Pt states she takes Plavix . Pt also states she has been having some generalized body aches, states pain is worse in her back. Denies any cough/congestion. Denies fever. Pt is A&Ox4 and NAD

## 2023-12-15 ENCOUNTER — Observation Stay: Admit: 2023-12-15 | Discharge: 2023-12-15 | Disposition: A | Attending: Family Medicine

## 2023-12-15 ENCOUNTER — Observation Stay

## 2023-12-15 ENCOUNTER — Encounter: Payer: Self-pay | Admitting: Radiology

## 2023-12-15 ENCOUNTER — Emergency Department

## 2023-12-15 DIAGNOSIS — I69398 Other sequelae of cerebral infarction: Secondary | ICD-10-CM | POA: Diagnosis not present

## 2023-12-15 DIAGNOSIS — I10 Essential (primary) hypertension: Secondary | ICD-10-CM

## 2023-12-15 DIAGNOSIS — J45909 Unspecified asthma, uncomplicated: Secondary | ICD-10-CM

## 2023-12-15 DIAGNOSIS — J4520 Mild intermittent asthma, uncomplicated: Secondary | ICD-10-CM | POA: Diagnosis not present

## 2023-12-15 DIAGNOSIS — G459 Transient cerebral ischemic attack, unspecified: Secondary | ICD-10-CM | POA: Diagnosis present

## 2023-12-15 DIAGNOSIS — E785 Hyperlipidemia, unspecified: Secondary | ICD-10-CM | POA: Diagnosis not present

## 2023-12-15 DIAGNOSIS — M21372 Foot drop, left foot: Secondary | ICD-10-CM

## 2023-12-15 LAB — ECHOCARDIOGRAM COMPLETE BUBBLE STUDY
AR max vel: 2.99 cm2
AV Peak grad: 4.2 mmHg
Ao pk vel: 1.03 m/s
Area-P 1/2: 4.39 cm2
S' Lateral: 2.6 cm

## 2023-12-15 LAB — HEMOGLOBIN A1C
Hgb A1c MFr Bld: 5.3 % (ref 4.8–5.6)
Mean Plasma Glucose: 105.41 mg/dL

## 2023-12-15 MED ORDER — TRAZODONE HCL 50 MG PO TABS: 25.0000 mg | ORAL_TABLET | Freq: Every evening | ORAL | Status: DC | PRN

## 2023-12-15 MED ORDER — IOHEXOL 350 MG/ML SOLN
75.0000 mL | Freq: Once | INTRAVENOUS | Status: AC | PRN
  Administered 2023-12-15: 75 mL via INTRAVENOUS

## 2023-12-15 MED ORDER — CLOPIDOGREL BISULFATE 75 MG PO TABS
75.0000 mg | ORAL_TABLET | Freq: Every day | ORAL | Status: DC
  Administered 2023-12-16: 75 mg via ORAL
  Filled 2023-12-15: qty 1

## 2023-12-15 MED ORDER — NIFEDIPINE ER OSMOTIC RELEASE 30 MG PO TB24
30.0000 mg | ORAL_TABLET | Freq: Every day | ORAL | Status: DC
  Administered 2023-12-15 – 2023-12-16 (×2): 30 mg via ORAL
  Filled 2023-12-15 (×2): qty 1

## 2023-12-15 MED ORDER — ASPIRIN 81 MG PO TBEC
81.0000 mg | DELAYED_RELEASE_TABLET | Freq: Every day | ORAL | Status: DC
  Administered 2023-12-15 – 2023-12-16 (×2): 81 mg via ORAL
  Filled 2023-12-15 (×2): qty 1

## 2023-12-15 MED ORDER — TIZANIDINE HCL 2 MG PO TABS
2.0000 mg | ORAL_TABLET | Freq: Once | ORAL | Status: AC
  Administered 2023-12-15: 2 mg via ORAL
  Filled 2023-12-15: qty 1

## 2023-12-15 MED ORDER — ACETAMINOPHEN 650 MG RE SUPP: 650.0000 mg | RECTAL | Status: DC | PRN

## 2023-12-15 MED ORDER — ENOXAPARIN SODIUM 60 MG/0.6ML IJ SOSY
45.0000 mg | PREFILLED_SYRINGE | INTRAMUSCULAR | Status: DC
  Administered 2023-12-15 – 2023-12-16 (×2): 45 mg via SUBCUTANEOUS
  Filled 2023-12-15 (×2): qty 0.6

## 2023-12-15 MED ORDER — SENNOSIDES-DOCUSATE SODIUM 8.6-50 MG PO TABS: 1.0000 | ORAL_TABLET | Freq: Every evening | ORAL | Status: DC | PRN

## 2023-12-15 MED ORDER — CLOPIDOGREL BISULFATE 75 MG PO TABS
300.0000 mg | ORAL_TABLET | Freq: Once | ORAL | Status: AC
  Administered 2023-12-15: 300 mg via ORAL
  Filled 2023-12-15: qty 4

## 2023-12-15 MED ORDER — STROKE: EARLY STAGES OF RECOVERY BOOK: Freq: Once | Status: AC

## 2023-12-15 MED ORDER — ROSUVASTATIN CALCIUM 20 MG PO TABS
20.0000 mg | ORAL_TABLET | Freq: Every day | ORAL | Status: DC
  Administered 2023-12-15 – 2023-12-16 (×2): 20 mg via ORAL
  Filled 2023-12-15 (×2): qty 1

## 2023-12-15 MED ORDER — ASPIRIN 81 MG PO CHEW: 81.0000 mg | CHEWABLE_TABLET | Freq: Every day | ORAL | Status: DC

## 2023-12-15 MED ORDER — SODIUM CHLORIDE 0.9 % IV SOLN: INTRAVENOUS | Status: AC

## 2023-12-15 MED ORDER — ASPIRIN 325 MG PO TABS
325.0000 mg | ORAL_TABLET | Freq: Once | ORAL | Status: AC
  Administered 2023-12-15: 325 mg via ORAL
  Filled 2023-12-15: qty 1

## 2023-12-15 MED ORDER — LEVETIRACETAM 500 MG PO TABS
500.0000 mg | ORAL_TABLET | Freq: Two times a day (BID) | ORAL | Status: DC
  Administered 2023-12-15 – 2023-12-16 (×2): 500 mg via ORAL
  Filled 2023-12-15 (×2): qty 1

## 2023-12-15 MED ORDER — POLYETHYLENE GLYCOL 3350 17 G PO PACK
17.0000 g | PACK | Freq: Two times a day (BID) | ORAL | Status: DC
  Administered 2023-12-15 (×2): 17 g via ORAL
  Filled 2023-12-15 (×3): qty 1

## 2023-12-15 MED ORDER — ACETAMINOPHEN 160 MG/5ML PO SOLN: 650.0000 mg | ORAL | Status: DC | PRN

## 2023-12-15 MED ORDER — CLOPIDOGREL BISULFATE 75 MG PO TABS: 75.0000 mg | ORAL_TABLET | Freq: Every day | ORAL | Status: DC

## 2023-12-15 MED ORDER — ACETAMINOPHEN 325 MG PO TABS
650.0000 mg | ORAL_TABLET | ORAL | Status: DC | PRN
  Administered 2023-12-15: 650 mg via ORAL
  Filled 2023-12-15: qty 2

## 2023-12-15 MED ORDER — ONDANSETRON HCL 4 MG/2ML IJ SOLN: 4.0000 mg | INTRAMUSCULAR | Status: DC | PRN

## 2023-12-15 MED ADMIN — Citalopram Hydrobromide Tab 20 MG (Base Equiv): 20 mg | ORAL | NDC 00904608561

## 2023-12-15 MED FILL — Citalopram Hydrobromide Tab 20 MG (Base Equiv): 20.0000 mg | ORAL | Qty: 1 | Status: AC

## 2023-12-15 NOTE — Consult Note (Addendum)
 NEUROLOGY CONSULT NOTE   Date of service: December 15, 2023 Patient Name: Patricia [name] MRN:  [id number] DOB:  Aug 31, 1965 Chief Complaint: Right sided numbness Requesting Provider: Lenon Marien CROME, MD  History of Present Illness  Patricia [name] is [name] 58 y.o. 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 While the patient was in the emergency room her right arm numbness improved but her right leg was still numb. She denied any headache, vertigo, new muscle weakness, CP, cough, SOB or blurred vision.    ROS  Comprehensive ROS performed and pertinent positives documented in HPI    Past History   Past Medical History:  Diagnosis Date   Asthma    Bronchitis    HLD (hyperlipidemia)    Hypertension    Stroke [facility])     Past Surgical History:  Procedure Laterality Date   CHOLECYSTECTOMY     COLONOSCOPY WITH PROPOFOL  N/[name] 01/11/2023   Procedure: COLONOSCOPY WITH PROPOFOL ;  Surgeon: Unk Corinn Skiff, MD;  Location: [facility] ENDOSCOPY;  Service: Gastroenterology;  Laterality: N/[name];   IR CT HEAD LTD  04/10/2022   IR CT HEAD LTD  04/10/2022   IR INTRAVSC STENT CERV CAROTID W/O EMB-PROT MOD SED INC ANGIO  04/10/2022   IR PERCUTANEOUS ART THROMBECTOMY/INFUSION INTRACRANIAL INC DIAG ANGIO  04/10/2022   IR RADIOLOGIST EVAL & MGMT  04/30/2022   RADIOLOGY WITH ANESTHESIA N/[name] 04/09/2022   Procedure: RADIOLOGY WITH ANESTHESIA;  Surgeon: Radiologist, Medication, MD;  Location: [facility] OR;  Service: Radiology;  Laterality: N/[name];   TUBAL LIGATION      Family History: Family History  Problem Relation Age of Onset   Hyperlipidemia Mother    Hypertension Mother     Social History  reports that she quit smoking about 2 years ago. Her smoking use included cigars. She has never used smokeless tobacco. She reports current alcohol use. She  reports current drug use. Drug: Marijuana.  Allergies  Allergen Reactions   Tramadol  Rash    Medications   Current Facility-Administered Medications:    [START ON 12/16/2023]  stroke: early stages of recovery book, , Does not apply, Once, Patricia [name], Patricia A, MD   0.9 %  sodium chloride  infusion, , Intravenous, Continuous, Patricia [name], Patricia A, MD   acetaminophen  (TYLENOL ) tablet 650 mg, 650 mg, Oral, Q4H PRN **OR** acetaminophen  (TYLENOL ) 160 MG/5ML solution 650 mg, 650 mg, Per Tube, Q4H PRN **OR** acetaminophen  (TYLENOL ) suppository 650 mg, 650 mg, Rectal, Q4H PRN, Patricia [name], Patricia A, MD   aspirin  EC tablet 81 mg, 81 mg, Oral, Daily, Lenon Marien CROME, MD   citalopram  (CELEXA ) tablet 20 mg, 20 mg, Oral, QHS, Patricia [name], Patricia A, MD   [START ON 12/16/2023] clopidogrel  (PLAVIX ) tablet 75 mg, 75 mg, Oral, Daily, Patricia [name], Patricia A, MD   enoxaparin  (LOVENOX ) injection 45 mg, 45 mg, Subcutaneous, Q24H, Patricia [name], Patricia A, MD   NIFEdipine (PROCARDIA-XL/NIFEDICAL-XL) 24 hr tablet 30 mg, 30 mg, Oral, Daily, Patricia [name], Patricia A, MD   ondansetron  (ZOFRAN ) injection 4 mg, 4 mg, Intravenous, Q4H PRN, Patricia [name], Patricia A, MD   polyethylene glycol (MIRALAX / GLYCOLAX) packet 17 g, 17 g, Oral, BID, Lenon Marien CROME, MD   rosuvastatin  (CRESTOR ) tablet 20 mg, 20 mg, Oral, Daily, Patricia [name], Patricia A, MD   traZODone (DESYREL) tablet 25 mg, 25 mg, Oral, QHS PRN, Patricia [name], Patricia LABOR, MD  No current facility-administered medications on file prior to encounter.   Current Outpatient Medications on File Prior to Encounter  Medication Sig Dispense Refill   aspirin  81 MG chewable tablet Chew 1 tablet (81 mg total) by mouth daily. 30 tablet 2   citalopram  (CELEXA ) 20 MG tablet Take 20 mg by mouth at bedtime.     clopidogrel  (PLAVIX ) 75 MG tablet Take 75 mg by mouth daily.     cyclobenzaprine  (FLEXERIL ) 10 MG tablet Take 10 mg by mouth 3 (three) times daily.     diphenhydrAMINE  (BENADRYL  ALLERGY) 25 mg capsule Take 1 capsule (25 mg total) by mouth every 6 (six) hours as  needed for up to 5 days. 10 capsule 0   gabapentin (NEURONTIN) 100 MG capsule Take 100 mg by mouth 2 (two) times daily.     hydrochlorothiazide  (HYDRODIURIL ) 25 MG tablet Take 25 mg by mouth daily.     levETIRAcetam  (KEPPRA ) 500 MG tablet Take 1 tablet (500 mg total) by mouth 2 (two) times daily. 180 tablet 3   NIFEdipine (ADALAT CC) 90 MG 24 hr tablet Take 30 mg by mouth daily.     ondansetron  (ZOFRAN ) 4 MG tablet Take 4 mg by mouth every 8 (eight) hours as needed for nausea.     Potassium Chloride  ER 20 MEQ TBCR Take 1 tablet by mouth daily.     rosuvastatin  (CRESTOR ) 20 MG tablet Take 1 tablet (20 mg total) by mouth daily. 30 tablet 2   tiZANidine (ZANAFLEX) 4 MG tablet Take 4 mg by mouth at bedtime.     Vitamin D , Ergocalciferol , (DRISDOL ) 1.25 MG (50000 UNIT) CAPS capsule Take 50,000 Units by mouth once [name] week.     albuterol  (PROVENTIL  HFA;VENTOLIN  HFA) 108 (90 Base) MCG/ACT inhaler Inhale 2 puffs into the lungs every 6 (six) hours as needed for wheezing or shortness of breath. (Patient not taking: Reported on 12/15/2023) 1 Inhaler 2   KLOR-CON  M20 20 MEQ tablet  (Patient not taking: Reported on 12/15/2023)       Vitals   Vitals:   12/15/23 0048 12/15/23 0328 12/15/23 0437 12/15/23 0824  BP:  104/80 132/86 111/76  Pulse:  84 81 92  Resp:  16 18 16   Temp: 98.4 F (36.9 C) 97.7 F (36.5 C) 97.7 F (36.5 C) 97.6 F (36.4 C)  TempSrc: Oral Oral  Oral  SpO2:  99% 100% 97%  Weight:   89.9 kg   Height:        Body mass index is 32.98 kg/m.   Physical Exam   Constitutional: Appears well-developed and well-nourished.  Psych: Affect appropriate to situation.  Eyes: No scleral injection.  HENT: No OP obstruction.  Head: Normocephalic.  Respiratory: Effort normal, non-labored breathing.  Skin: WDI.   Neurologic Examination   Mental Status: Awake and alert. Fully oriented. Thought content appropriate. Speech fluent without evidence of aphasia.  Able to follow all commands  without difficulty. Cranial Nerves: II: Temporal visual fields intact with no extinction to DSS. PERRL. III,IV, VI: No ptosis. EOMI. No nystagmus. V: Temp sensation equal bilaterally VII: Smile is symmetric VIII: Hearing intact to voice IX,X: No hypophonia or hoarseness XI: Symmetric XII: Midline tongue extension Motor: RUE: 5/5 LUE: 4/5, mild bradykinesia RLE: 5/5 LLE: 4+/5 proximally with weaker ADF, mild bradykinesia   Sensory: Temp and FT intact x 4. No extinction to DSS. Deep Tendon Reflexes: Brisker on the left relative to the right Cerebellar: No ataxia with FNF bilaterally Gait: Deferred  Labs/Imaging/Neurodiagnostic studies  CBC:  Recent Labs  Lab 12/14/23 1828  WBC 6.7  NEUTROABS 3.4  HGB 11.8*  HCT 35.0*  MCV 96.2  PLT 265   Basic Metabolic Panel:  Lab Results  Component Value Date   NA 136 12/14/2023   K 3.7 12/14/2023   CO2 28 12/14/2023   GLUCOSE 89 12/14/2023   BUN 15 12/14/2023   CREATININE 1.01 (H) 12/14/2023   CALCIUM  8.8 (L) 12/14/2023   GFRNONAA >60 12/14/2023   GFRAA >60 10/30/2019   Lipid Panel:  Lab Results  Component Value Date   LDLCALC 81 04/10/2022   HgbA1c:  Lab Results  Component Value Date   HGBA1C 5.1 04/09/2022   Alcohol Level     Component Value Date/Time   [facility] <15 12/14/2023 1828   INR  Lab Results  Component Value Date   INR 0.8 12/14/2023   APTT  Lab Results  Component Value Date   APTT 32 12/14/2023   AED levels:  Lab Results  Component Value Date   LEVETIRACETA 13.3 07/01/2022   TTE: 1. Left ventricular ejection fraction, by estimation, is 60 to 65%. The  left ventricle has normal function. The left ventricle has no regional  wall motion abnormalities. Left ventricular diastolic parameters are  consistent with Grade I diastolic dysfunction (impaired relaxation).   2. Right ventricular systolic function is normal. The right ventricular  size is normal.   3. The mitral valve is normal in structure.  No evidence of mitral valve  regurgitation.   4. The aortic valve was not well visualized. Aortic valve regurgitation  is not visualized.   5. The inferior vena cava is normal in size with greater than 50%  respiratory variability, suggesting right atrial pressure of 3 mmHg.   6. Agitated saline contrast bubble study was negative, with no evidence  of any interatrial shunt.   Prior EEG, Apr 23, 2022: Right posterior quadrant sharp wave. Clinical Interpretation: This EEG is suggestive of an area of potential epileptogenicity in the right posterior quadrant. There was no seizure recorded on this study.  Prior EEG, June 15, 2022: Normal electroencephalogram, awake, asleep and with activation procedures. There are no focal lateralizing or epileptiform features.   ASSESSMENT  Larinda DAMARA KLUNDER is [name] 58 y.o. 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 While the patient was in the emergency room her right arm numbness improved but her right leg was still numb.  - Exam reveals mild chronic left sided weakness - CT head: Stable and essentially normal head CT. no acute intracranial abnormality.  - CTA of head and neck: Distal right CCA through proximal ICA stent is patent and stable from prior CTA. No large vessel occlusion, significant atherosclerosis or stenosis in the head or neck. No new intracranial abnormality. Subtle chronic right basal ganglia infarct. - MRI brain: No acute intracranial abnormality. Remote right basal ganglia infarct. Otherwise stable and essentially normal brain MRI for age. Chronic incidental findings are noted. - MRI C-spine: Normal MRI appearance of the cervical spinal cord. Multilevel cervical spondylosis with resultant mild spinal stenosis at C3-C4 through C5, C6. Mild to moderate left-sided foraminal narrowing at C5  through C7.  - TTE report with normal EF. No mural thrombus or valvular vegetation mentioned in the report.  - Labs: Glucose and HgbA1c are normal. WBC normal. Mild  anemia. Coags normal.  - Impression:  - TIA with right sided sensory numbness.  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  - Possible seizure disorder, treated empirically with Keppra  after focal discharges were noted on EEG in 2024. Home medications listed in Epic include Keppra  500 mg BID, but she has not been restarted on Keppra  during this admission. In May of this year she had an outpatient Neurology visit. At that time she reported no further seizures. Per Pharmacy, she has up to date refills of this medication. Will continue Keppra  at this time.   RECOMMENDATIONS  - Vitamin B12 level (level in 2024 was low). If repeat level is low, start B12 supplementation. - HgbA1c, fasting lipid panel - PT consult, OT consult, Speech consult - Continue home ASA and rosuvastatin  - Agree with starting Plavix .  - Risk factor modification - Telemetry monitoring - Frequent neuro checks - Restarting her home Keppra    ______________________________________________________________________    Bonney SHARK, Carlos Quackenbush, MD [facility]

## 2023-12-15 NOTE — ED Notes (Signed)
 Patient transported to MRI

## 2023-12-15 NOTE — Progress Notes (Signed)
 SLP Cancellation Note  Patient Details Name: SHATONA ANDUJAR MRN: [id number] DOB: 02/06/1966   Cancelled treatment:        Patient admitted for TIA. Spoke with patient regarding both cognitive-communication and swallow function prior to and during current hospitalization. Denied appreciation to changes in language, cognition or motor speech function. 100% intelligibility. Language fluent and able to participate in conversational discourse regarding novel and complex concepts without difficulty. Hoarse vocal quality but endorses longstanding sleep apnea (hasn't initiated CPAP use), seasonal allergies, and asthma. Passed dysphagia swallow screen and reportedly tolerating unrestricted diet texture (regular solids/thin liquids). No acute SLP needs and will delist.   Rosaline CHRISTELLA Boas 12/15/2023, 11:23 AM

## 2023-12-15 NOTE — Assessment & Plan Note (Signed)
Will continue Celexa

## 2023-12-15 NOTE — Progress Notes (Signed)
  INTERVAL PROGRESS NOTE PEARLENA OW- 58 y.o. female  LOS: 0 __________________________________________________________________  SUBJECTIVE: Admitted 12/14/2023 with cc of  Chief Complaint  Patient presents with   Numbness   Since admission, remains stable  OBJECTIVE: Blood pressure 132/86, pulse 81, temperature 97.7 F (36.5 C), resp. rate 18, height 5' 5 (1.651 m), weight 89.9 kg, SpO2 100%.  General: NAD, pleasant, able to participate in exam Neuro: alert and oriented, no focal deficits Psych: Normal affect and mood   ASSESSMENT/PLAN:  I have reviewed the full H&P by Dr. Lawence, and I reviewed pertinent findings outlined therein. In addition:  TIA- echo being completed currently.  OT evaluated and recommended SNF.  Awaiting neurology recs   Essential hypertension - Will continue antihypertensive therapy with permissive parameters. - holding hydrochlorothiazide     Principal Problem:   TIA (transient ischemic attack) Active Problems:   Essential hypertension   Asthma, chronic   Dyslipidemia   Depression   Marien LITTIE Piety, DO [facility] 12/15/2023, 7:30 AM    [email or web address] Available by Epic secure chat 7AM-7PM. If 7PM-7AM, please contact night-coverage   No Charge

## 2023-12-15 NOTE — H&P (Signed)
**Note Patricia-Identified via Obfuscation**  Patricia [name]   PATIENT NAME: Patricia [name]    MR#:  [id number]  DATE OF BIRTH:  23-Aug-1965  DATE OF ADMISSION:  12/14/2023  PRIMARY CARE PHYSICIAN: Patricia Rock HERO, [name]   Patient is coming from: Home  REQUESTING/REFERRING PHYSICIAN: Jossie Birmingham, [name]  CHIEF COMPLAINT:   Chief Complaint  Patient presents with   Numbness    HISTORY OF PRESENT ILLNESS:  Patricia [name] is a 58 y.o. 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  While the patient was in the emergency room her right arm numbness has improved but her right leg was still numb.  She denies any headache or distal blurred vision.  No focal muscle weakness.  No tinnitus or vertigo.  Note chest pain or palpitations.  No cough or wheezing or dyspnea.  No urinary or stool incontinence.  No dysuria, oliguria or hematuria or flank pain.  ED Course: When she came to the ER vital signs were within normal.  Labs revealed calcium  of 8.8 and AST 42 with otherwise unremarkable CMP.  CBC showed mild anemia.  Alcohol level was less than 15. EKG as reviewed by me :  EKG showed normal sinus rhythm with a rate of 88 with low voltage QRS. Imaging: Noncontrasted  head CT scan showed no acute intracranial abnormalities. Brain MRI showed remote right basal ganglia infarct with otherwise unremarkable study. C-spine MRI showed the following: 1. Normal MRI appearance of the cervical spinal cord. 2. Multilevel cervical spondylosis with resultant mild spinal stenosis at C3-C4 through C5, C6. 3. Mild to moderate left-sided foraminal narrowing at C5 through C7 as above. 4. 1.8 cm left thyroid  nodule. This has been previously evaluated bye thyroid  ultrasound on 07/26/2017. Please refer to this exam regarding any potential follow-up recommendations regarding this  finding.  The patient was given aspirin  and loaded with p.o. Plavix , in addition to p.o. Zanaflex.  She will be admitted to observation medical telemetry bed for further evaluation and management. PAST MEDICAL HISTORY:   Past Medical History:  Diagnosis Date   Asthma    Bronchitis    HLD (hyperlipidemia)    Hypertension    Stroke (HCC)     PAST SURGICAL HISTORY:   Past Surgical History:  Procedure Laterality Date   CHOLECYSTECTOMY     COLONOSCOPY WITH PROPOFOL  N/A 01/11/2023   Procedure: COLONOSCOPY WITH PROPOFOL ;  Surgeon: Patricia [name];  Location: [facility] ENDOSCOPY;  Service: Gastroenterology;  Laterality: N/A;   IR CT HEAD LTD  04/10/2022   IR CT HEAD LTD  04/10/2022   IR INTRAVSC STENT CERV CAROTID W/O EMB-PROT MOD SED INC ANGIO  04/10/2022   IR PERCUTANEOUS ART THROMBECTOMY/INFUSION INTRACRANIAL INC DIAG ANGIO  04/10/2022   IR Patricia [name] EVAL & MGMT  04/30/2022   RADIOLOGY WITH ANESTHESIA N/A 04/09/2022   Procedure: RADIOLOGY WITH ANESTHESIA;  Surgeon: Patricia [name];  Location: [facility] OR;  Service: Radiology;  Laterality: N/A;   TUBAL LIGATION      SOCIAL HISTORY:   Social History   Tobacco Use   Smoking status: Former    Types: Cigars    Quit date: 02/11/2021    Years since quitting: 2.8   Smokeless tobacco: Never  Substance Use Topics   Alcohol use: Yes    Comment: occassionaly    FAMILY HISTORY:   Family  History  Problem Relation Age of Onset   Hyperlipidemia Mother    Hypertension Mother     DRUG ALLERGIES:   Allergies  Allergen Reactions   Tramadol  Rash    REVIEW OF SYSTEMS:   ROS As per history of present illness. All pertinent systems were reviewed above. Constitutional, HEENT, cardiovascular, respiratory, GI, GU, musculoskeletal, neuro, psychiatric, endocrine, integumentary and hematologic systems were reviewed and are otherwise negative/unremarkable except for positive findings mentioned above in the HPI.   MEDICATIONS  AT HOME:   Prior to Admission medications   Medication Sig Start Date End Date Taking? Authorizing Provider  aspirin  81 MG chewable tablet Chew 1 tablet (81 mg total) by mouth daily. 04/13/22  Yes Patricia [name]  citalopram  (CELEXA ) 20 MG tablet Take 20 mg by mouth at bedtime. 03/17/22  Yes Provider, Historical, [name]  clopidogrel  (PLAVIX ) 75 MG tablet Take 75 mg by mouth daily.   Yes Provider, Historical, [name]  hydrochlorothiazide  (HYDRODIURIL ) 25 MG tablet Take 25 mg by mouth daily. 03/17/22  Yes Provider, Historical, [name]  levETIRAcetam  (KEPPRA ) 500 MG tablet Take 1 tablet (500 mg total) by mouth 2 (two) times daily. 07/03/23  Yes Patricia [name]  NIFEdipine (ADALAT CC) 90 MG 24 hr tablet Take 30 mg by mouth daily. 03/20/22  Yes Provider, Historical, [name]  ondansetron  (ZOFRAN ) 4 MG tablet Take 4 mg by mouth every 8 (eight) hours as needed for nausea. 05/09/22  Yes Provider, Historical, [name]  Potassium Chloride  ER 20 MEQ TBCR Take 1 tablet by mouth daily. 05/10/22  Yes Provider, Historical, [name]  rosuvastatin  (CRESTOR ) 20 MG tablet Take 1 tablet (20 mg total) by mouth daily. 04/13/22  Yes Patricia [name]  tiZANidine (ZANAFLEX) 4 MG tablet Take 4 mg by mouth at bedtime. 11/10/22  Yes Provider, Historical, [name]  Vitamin D , Ergocalciferol , (DRISDOL ) 1.25 MG (50000 UNIT) CAPS capsule Take 50,000 Units by mouth once a week. 09/13/22  Yes Provider, Historical, [name]  albuterol  (PROVENTIL  HFA;VENTOLIN  HFA) 108 (90 Base) MCG/ACT inhaler Inhale 2 puffs into the lungs every 6 (six) hours as needed for wheezing or shortness of breath. Patient not taking: Reported on 12/15/2023 07/08/16   Patricia Anes, [name]  cyclobenzaprine  (FLEXERIL ) 10 MG tablet Take 10 mg by mouth 3 (three) times daily. 05/10/22   Provider, Historical, [name]  diphenhydrAMINE  (BENADRYL  ALLERGY) 25 mg capsule Take 1 capsule (25 mg total) by mouth every 6 (six) hours as needed for up to 5 days. 06/04/22 07/03/23  Patricia [name]  gabapentin  (NEURONTIN) 100 MG capsule Take 100 mg by mouth 2 (two) times daily. 11/30/22 07/03/23  Provider, Historical, [name]  KLOR-CON  M20 20 MEQ tablet  05/03/22   Provider, Historical, [name]      VITAL SIGNS:  Blood pressure 132/86, pulse 81, temperature 97.7 F (36.5 C), resp. rate 18, height 5' 5 (1.651 m), weight 89.9 kg, SpO2 100%.  PHYSICAL EXAMINATION:  Physical Exam  GENERAL:  58 y.o.-year-old patient lying in the bed with no acute distress.  EYES: Pupils equal, round, reactive to light and accommodation. No scleral icterus. Extraocular muscles intact.  HEENT: Head atraumatic, normocephalic. Oropharynx and nasopharynx clear.  NECK:  Supple, no jugular venous distention. No thyroid  enlargement, no tenderness.  LUNGS: Normal breath sounds bilaterally, no wheezing, rales,rhonchi or crepitation. No use of accessory muscles of respiration.  CARDIOVASCULAR: Regular rate and rhythm, S1, S2 normal. No murmurs, rubs, or gallops.  ABDOMEN: Soft, nondistended, nontender. Bowel sounds present. No  organomegaly or mass.  EXTREMITIES: No pedal edema, cyanosis, or clubbing.  NEUROLOGIC: Cranial nerves II through XII are intact. Muscle strength 5/5 in all extremities. Sensation was slightly diminished to light touch in the right lower extremity.  Gait not checked.  PSYCHIATRIC: The patient is alert and oriented x 3.  Normal affect and good eye contact. SKIN: No obvious rash, lesion, or ulcer.   LABORATORY PANEL:   CBC Recent Labs  Lab 12/14/23 1828  WBC 6.7  HGB 11.8*  HCT 35.0*  PLT 265   ------------------------------------------------------------------------------------------------------------------  Chemistries  Recent Labs  Lab 12/14/23 1828  NA 136  K 3.7  CL 100  CO2 28  GLUCOSE 89  BUN 15  CREATININE 1.01*  CALCIUM  8.8*  AST 42*  ALT 15  ALKPHOS 95  BILITOT 0.6    ------------------------------------------------------------------------------------------------------------------  Cardiac Enzymes No results for input(s): TROPONINI in the last 168 hours. ------------------------------------------------------------------------------------------------------------------  RADIOLOGY:  MR Cervical Spine Wo Contrast Result Date: 12/15/2023 EXAM: MRI CERVICAL SPINE WITHOUT CONTRAST 12/15/2023 02:12:00 AM TECHNIQUE: Multiplanar multisequence MRI of the cervical spine was performed. COMPARISON: None available. CLINICAL HISTORY: Myelopathy, acute, cervical spine. Pt c/o R sided arm and leg numbness that started yesterday around 0900, hx of stroke in Feb 2024. Pt states she takes Plavix . Pt also states she has been having some generalized body aches, states pain is worse in her back. Numbness in bilateral feet. FINDINGS: BONES AND ALIGNMENT: Straightening of the normal cervical lordosis. No listhesis. Vertebral body height maintained without acute or chronic fracture. Bone marrow signal intensity within normal limits. No worrisome osseous lesions or abnormal marrow edema. SPINAL CORD: Normal spinal cord size. Normal signal morphology seen within the cervical spinal cord. No cord signal changes suggest myelopathy. SOFT TISSUES: Paraspinal soft tissues are within normal limits. No paraspinal mass. 1.8 cm left thyroid  nodule noted (series 14, image 33). Small lipoma noted at the left quadrigeminal plate cistern. Visualized brain and posterior fossa otherwise unremarkable. Craniovertebral junction within normal limits. Preserved flow void seen within the vertebral arteries bilaterally. C2-C3: Unremarkable. C3-C4: Broad based central disc protrusion and density ventral thecal sac. Mild left sided facet hypertrophy. Mild spinal stenosis. Foramina remain patent. C4-C5: Diffuse disc bulge with bilateral uncovertebral spurring. Flattening and partial effacement of the ventral thecal  sac with resulted mild spinal stenosis. Mild to moderate left with mild right C5 foraminal narrowing. C5-C6: Degenerative vertebral disc space narrowing with diffuse disc osteophyte complex. Broad posterior component flattens and partially effaces the ventral thecal sac, asymmetric to the left. Minimal cord flattening without cord signal changes. Borderline mild spinal stenosis. Uncovertebral spurring with mild left C6 foraminal narrowing. Right normal foraminal remains patent. C6-C7: Degenerative vertebral disc space narrowing with diffuse disc osteophyte complex. No spinal stenosis. Mild left C7 foraminal narrowing. Right normal foraminal remains patent. C7-T1: Unremarkable. IMPRESSION: 1. Normal MRI appearance of the cervical spinal cord. 2. Multilevel cervical spondylosis with resultant mild spinal stenosis at C3-C4 through C5, C6. 3. Mild to moderate left-sided foraminal narrowing at C5 through C7 as above. 4. 1.8 cm left thyroid  nodule. This has been previously evaluated bye thyroid  ultrasound on 07/26/2017. Please refer to this exam regarding any potential follow-up recommendations regarding this finding. Electronically signed by: Morene Hoard [name] 12/15/2023 03:17 AM EDT RP Workstation: [id number]   MR BRAIN WO CONTRAST Result Date: 12/15/2023 EXAM: MRI BRAIN WITHOUT CONTRAST 12/15/2023 02:12:00 AM TECHNIQUE: Multiplanar multisequence MRI of the head/brain was performed without the administration of intravenous contrast. COMPARISON: CT from 12/14/2023. CLINICAL  HISTORY: Neuro deficit, acute, stroke suspected. Pt c/o R sided arm and leg numbness that started yesterday around 0900, hx of stroke in Feb 2024. Pt states she takes Plavix . Pt also states she has been having some generalized body aches, states pain is worse in her back. Numbnes in bilateral feet. FINDINGS: BRAIN AND VENTRICLES: No acute infarct. Few small remote lacunar infarcts present about the right basal ganglia. Chronic hemosiderin  staining present within this region. No intracranial hemorrhage. No concerning mass. 5 mm lipoma along the left quadrigeminal plate cistern noted. No midline shift. No hydrocephalus. The sella is unremarkable. Normal flow voids. ORBITS: Probable small dacrystocele at the left medial canthus measures 9 mm, relatively stable from prior. SINUSES AND MASTOIDS: Mild scattered mucosal thickening about the ulnar air cells and maxillary sinuses. Few small maxillary sinus retention cysts noted. Trace left mastoid effusion noted, with opacification. BONES AND SOFT TISSUES: Normal marrow signal. No acute soft tissue abnormality. IMPRESSION: 1. No acute intracranial abnormality. 2. Remote right basal ganglia infarct. 3. Otherwise stable and essentially normal brain MRI for age. Chronic incidental findings as above. Electronically signed by: Morene Hoard [name] 12/15/2023 03:06 AM EDT RP Workstation: [id number]   CT HEAD WO CONTRAST Result Date: 12/14/2023 EXAM: CT HEAD WITHOUT CONTRAST 12/14/2023 06:39:45 PM TECHNIQUE: CT of the head was performed without the administration of intravenous contrast. Automated exposure control, iterative reconstruction, and/or weight based adjustment of the mA/kV was utilized to reduce the radiation dose to as low as reasonably achievable. COMPARISON: Compared to 12/27/2022. CLINICAL HISTORY: Neuro deficit, acute, stroke suspected. Pt via POV from home. Pt c/o R sided arm and leg numbness that started yesterday around 0900, hx of stroke in Feb 2024. Pt states she takes Plavix . Pt also states she has been having some generalized body aches, states pain is worse in her back. Denies any cough/congestion. FINDINGS: BRAIN AND VENTRICLES: No acute hemorrhage. No evidence of acute infarct. No hydrocephalus. No extra-axial collection. No mass effect or midline shift. Subcentimeter lipoma near the quadrigeminal plate cistern, stable. ORBITS: No acute abnormality. SINUSES: No acute abnormality. SOFT  TISSUES AND SKULL: No acute soft tissue abnormality. No skull fracture. IMPRESSION: 1. Stable and essentially normal head CT. no acute intracranial abnormality. Electronically signed by: Morene Hoard [name] 12/14/2023 07:09 PM EDT RP Workstation: [id number]      IMPRESSION AND PLAN:  Assessment and Plan: * TIA (transient ischemic attack) - This is associated with improving right sided numbness. - The patient will be admitted to an observation medically monitored bed.   - We will follow neuro checks q.4 hours for 24 hours.   - The patient will be placed on aspirin  and plan.   - Will obtain 2D echo with bubble study .   - A neurology consultation  as well as physical/occupation/speech therapy consults will be obtained in a.m.SABRA   - The patient will be placed on statin therapy and fasting lipids will be checked.   Essential hypertension - Will continue antihypertensive therapy with permissive parameters.  Asthma, chronic The patient has no current exacerbation.  Dyslipidemia - Will continue statin therapy and check lipid profile.  Depression - Will continue Celexa .     DVT prophylaxis: Lovenox .  Advanced Care Planning:  Code Status: full code.  Family Communication:  The plan of care was discussed in details with the patient (and family). I answered all questions. The patient agreed to proceed with the above mentioned plan. Further management will depend upon hospital course. Disposition Plan: Back  to previous home environment Consults called: Neurology All the records are reviewed and case discussed with ED provider.  Status is: Observation I certify that at the time of admission, it is my clinical judgment that the patient will require hospital care extending less than 2 midnights.                            Dispo: The patient is from: Home              Anticipated d/c is to: Home              Patient currently is not medically stable to d/c.              Difficult to  place patient: No  Madison DELENA Peaches M.D on 12/15/2023 at 6:47 AM  [facility]   From 7 PM-7 AM, contact night-coverage [email or web address]  CC: Primary care physician; Patricia Rock HERO, [name]

## 2023-12-15 NOTE — Assessment & Plan Note (Signed)
-   This is associated with improving right sided numbness. - The patient will be admitted to an observation medically monitored bed.   - We will follow neuro checks q.4 hours for 24 hours.   - The patient will be placed on aspirin  and plan.   - Will obtain 2D echo with bubble study .   - A neurology consultation  as well as physical/occupation/speech therapy consults will be obtained in a.m.SABRA   - The patient will be placed on statin therapy and fasting lipids will be checked.

## 2023-12-15 NOTE — Evaluation (Signed)
 Occupational Therapy Evaluation Patient Details Name: Patricia [name] MRN: [id number] DOB: [birth year] Today's Date: 12/15/2023   History of Present Illness   Patricia [name] is a 58 y.o. 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 Noncontrasted  head CT scan showed no acute intracranial abnormalities.     Clinical Impressions Patricia [name] was seen for OT evaluation this date. Prior to hospital admission, pt was generally independent with ADL/IADL management. She endorses driving, grocery shopping, doing housework, etc. Pt lives alone in an apartment home with her adult children available to assist if needed. Pt presents with deficits in strength, activity tolerance, safety awareness, and balance affecting safe and optimal ADL completion. Pt currently requires MIN A for bed mobility and LB ADL management from STS, and close CGA for safety with mobility using a RW. Pt would benefit from skilled OT services to address noted impairments and functional limitations (see below for any additional details) in order to maximize safety and independence while minimizing future risk of falls, injury, and readmission. Anticipate the need for follow up OT services upon acute hospital DC.     If plan is discharge home, recommend the following:   A little help with walking and/or transfers;A little help with bathing/dressing/bathroom;Help with stairs or ramp for entrance;Assist for transportation;Assistance with cooking/housework     Functional Status Assessment   Patient has had a recent decline in their functional status and demonstrates the ability to make significant improvements in function in a reasonable and predictable amount of time.     Equipment Recommendations         Recommendations for Other  Services         Precautions/Restrictions   Precautions Precautions: Fall Recall of Precautions/Restrictions: Intact Restrictions Weight Bearing Restrictions Per Provider Order: No     Mobility Bed Mobility Overal bed mobility: Needs Assistance Bed Mobility: Supine to Sit     Supine to sit: Min assist, HOB elevated, Used rails     General bed mobility comments: MIN A to advance hips toward EOB.    Transfers Overall transfer level: Needs assistance Equipment used: Rolling walker (2 wheels) Transfers: Sit to/from Stand Sit to Stand: Contact guard assist                  Balance Overall balance assessment: Needs assistance Sitting-balance support: Feet supported, No upper extremity supported Sitting balance-Leahy Scale: Good     Standing balance support: Reliant on assistive device for balance, During functional activity, Bilateral upper extremity supported Standing balance-Leahy Scale: Fair Standing balance comment: fatigues quickly                           ADL either performed or assessed with clinical judgement   ADL Overall ADL's : Needs assistance/impaired                                       General ADL Comments: MIN A for bed mobility, close CGA for safety to walk ~3 feet away from bed before pt fatigues and requests to return to bed. Very limited activity tolerance. Anticipate CGA to min A for exertional LB ADL management including bathing and dressing.     Vision Baseline Vision/History:  1 Wears glasses Ability to See in Adequate Light: 1 Impaired Patient Visual Report: No change from baseline       Perception         Praxis         Pertinent Vitals/Pain Pain Assessment Pain Assessment: 0-10 Pain Score: 3  Pain Location: L hip with mobility Pain Descriptors / Indicators: Aching, Sore Pain Intervention(s): Limited activity within patient's tolerance, Monitored during session, Repositioned      Extremity/Trunk Assessment Upper Extremity Assessment Upper Extremity Assessment: Generalized weakness;Right hand dominant (BUE grossly symmetrical, pt endorses RUE numbness is totally gone. Baseline LUE deficits with shoulder activation. Grossly 3+/5 t/o.)   Lower Extremity Assessment Lower Extremity Assessment: Generalized weakness;Defer to PT evaluation   Cervical / Trunk Assessment Cervical / Trunk Assessment: Normal   Communication Communication Communication: No apparent difficulties   Cognition Arousal: Alert Behavior During Therapy: WFL for tasks assessed/performed Cognition: No apparent impairments                               Following commands: Intact       Cueing  General Comments   Cueing Techniques: Verbal cues  Vitals monitored, HR noted to be ~90-100's at rest per tele monitor. RN aware.   Exercises Other Exercises Other Exercises: Pt educated on safety and falls prevention strategies, DC recs, and routines modifications to support safety and functional independence upon hospital DC.   Shoulder Instructions      Home Living Family/patient expects to be discharged to:: Private residence   Available Help at Discharge: Family;Available 24 hours/day (Pt reports son could come stay with her if needed.) Type of Home: Apartment       Home Layout: One level     Bathroom Shower/Tub: Chief Strategy Officer: Standard     Home Equipment: Agricultural consultant (2 wheels)   Additional Comments: Has RW, but does not use.      Prior Functioning/Environment Prior Level of Function : Independent/Modified Independent;Driving             Mobility Comments: [employer], denies falls but endorses at least one near miss. ADLs Comments: Independent, drives, grocery shops, cares for grandchildren.    OT Problem List: Decreased strength;Decreased coordination;Decreased range of motion;Decreased activity tolerance;Decreased  safety awareness;Decreased knowledge of use of DME or AE;Impaired balance (sitting and/or standing);Impaired UE functional use;Cardiopulmonary status limiting activity   OT Treatment/Interventions: Self-care/ADL training;Therapeutic exercise;Therapeutic activities;Energy conservation;DME and/or AE instruction;Patient/family education;Balance training;Neuromuscular education      OT Goals(Current goals can be found in the care plan section)   Acute Rehab OT Goals Patient Stated Goal: To go home OT Goal Formulation: With patient Time For Goal Achievement: 12/29/23 Potential to Achieve Goals: Good ADL Goals Pt Will Perform Grooming: sitting;with modified independence;standing Pt Will Perform Lower Body Dressing: with set-up;with supervision;sit to/from stand;with adaptive equipment Pt Will Transfer to Toilet: bedside commode;regular height toilet;ambulating;with set-up;with supervision Pt Will Perform Toileting - Clothing Manipulation and hygiene: with set-up;with supervision;sit to/from stand;with adaptive equipment   OT Frequency:  Min 2X/week    Co-evaluation              AM-PAC OT 6 Clicks Daily Activity     Outcome Measure Help from another person eating meals?: A Little Help from another person taking care of personal grooming?: A Little Help from another person toileting, which includes using toliet, bedpan, or urinal?: A Little Help from another person  bathing (including washing, rinsing, drying)?: A Little Help from another person to put on and taking off regular upper body clothing?: A Little Help from another person to put on and taking off regular lower body clothing?: A Little 6 Click Score: 18   End of Session Equipment Utilized During Treatment: Gait belt;Rolling walker (2 wheels) Nurse Communication: Mobility status  Activity Tolerance: Patient tolerated treatment well Patient left: in bed;with call bell/phone within reach;with bed alarm set  OT Visit  Diagnosis: Other abnormalities of gait and mobility (R26.89);Muscle weakness (generalized) (M62.81)                Time: 9098-9067 OT Time Calculation (min): 31 min Charges:  OT General Charges $OT Visit: 1 Visit OT Evaluation $OT Eval Moderate Complexity: 1 Mod OT Treatments $Self Care/Home Management : 8-22 mins  Jhonny Pelton, M.S., OTR/L 12/15/23, 12:15 PM

## 2023-12-15 NOTE — Assessment & Plan Note (Signed)
-   Will continue antihypertensive therapy with permissive parameters.

## 2023-12-15 NOTE — Consult Note (Signed)
 [facility] Tele[facility]rology Consult Services  Stat Consult  Patient Name:   Patricia [name], Patricia [name] Date of Birth:   Jan 24, 1966 Identification Number:   MRN - [id number] Date of Service:   12/15/2023 00:30:56  Diagnosis:       I63.89 - Cerebrovascular accident (CVA) due to other mechanism ([facility])       R20.8 - Other disturbances of skin sensation  Impression 58YOF with a PMHx of HTN, HLD, previous ischemic stroke in 2024 with subtle residual L sided weakness, presenting to the [facility] ED in the setting of acute onset R sided dysesthesia, with numbness progressing from her R hand and traveling proximally up her arm, then involving the leg and moving distally to her R knee. Symptoms do not follow a clear dermatomal pattern and involve multiple segments from the cervical through lumbosacral distributions, making likelihood of a radiculopathy or verve root compression significantly lower and making differential for an acute stroke vs a cervical myelopathic process significantly higher. Moving forward, recommend empirically treating as a stroke for now, with recommendation for admission and MRI brain and C spine to rule out an ischemic vs cervicogenic process.   Recommendations: Our recommendations are outlined below.  Diagnostic Studies : MRI head without contrast CTA head and neck with contrast MRI C spine wo con  Laboratory Studies : Lipid panel I ordered Hemoglobin A1c TSH B12  Antithrombotic Medication : Bolus with Clopidogrel  300 mg bolus x1 and initiate dual antiplatelet therapy with Aspirin  81 mg daily and Clopidogrel  75 mg daily Statins for LDL goal less than 70 PRN antihypertensive medications for BP over 180/110  Anticoagulant Medication : Not indicated  Nursing Recommendations : IV Fluids, avoid dextrose  containing fluids, Maintain euglycemia Neuro checks q4 hrs x 24 hrs and then per shift Head of bed 30 degrees Continue with Telemetry  Consultations  : Recommend Speech therapy if failed dysphagia screen Physical therapy/Occupational therapy  DVT Prophylaxis : Choice of Primary Team  Disposition : Neurology will follow  Miscellaneous : Trial Zanaflex 2mg  x once for back pain and spasms    ----------------------------------------------------------------------------------------------------    Metrics: Dispatch Time: 12/15/2023 00:26:00 Callback Response Time: 12/15/2023 00:31:40  Primary Provider Notified of Diagnostic Impression and Management Plan on: 12/15/2023 01:07:48   CT HEAD: I personally reviewed all the CT images that were available to me and it showed: no blood products or early ischemic changes    ----------------------------------------------------------------------------------------------------  Chief Complaint: Acute onset R arm and leg numbness since 0900 10/16  History of Present Illness: Patient is a 58 year old Female. 58YOF with a PMHx of HTN, HLD, previous ischemic stroke in 2024 with subtle residual L sided weakness, presenting to the [facility] ED in the setting of acute onset R sided numbness. Per patient, symptom onset at 0900 today; patient had woken up and was able to ambulate to kitchen, when she noticed pain in both of her feet; after returning to bed, she again tried to stand yet noticed a sudden onset of numbness in her R hand, which radiated proximally up her arm and then to her R leg, starting at her hip and going down to her knee. Per patient, arm numbness improved yet leg continues to feel numb and heavy. Denies any changes in speech or vision, and although she admits to frequent headaches, denies any active headache at this time. Patient does also admit to significant L arm and leg pain as well as lower back pain, which is chronic yet more pronounced today.  Past Medical History:      Hypertension      Hyperlipidemia      Stroke      There is no history of Atrial  Fibrillation      There is no history of Seizures  Medications:  No Anticoagulant use  Antiplatelet use: Yes [id number] Reviewed EMR for current medications  Allergies:  Reviewed  Social History: Drug Use: No  Family History:  There is no family history of premature cerebrovascular disease pertinent to this consultation  ROS : 14 Points Review of Systems was performed and was negative except mentioned in HPI.  Past Surgical History: There Is No Surgical History Contributory To Today's Visit   Examination: BP(130/90), Pulse(94), 1A: Level of Consciousness - Alert; keenly responsive + 0 1B: Ask Month and Age - Both Questions Right + 0 1C: Blink Eyes & Squeeze Hands - Performs Both Tasks + 0 2: Test Horizontal Extraocular Movements - Normal + 0 3: Test Visual Fields - No Visual Loss + 0 4: Test Facial Palsy (Use Grimace if Obtunded) - Normal symmetry + 0 5A: Test Left Arm Motor Drift - No Drift for 10 Seconds + 0 5B: Test Right Arm Motor Drift - No Drift for 10 Seconds + 0 6A: Test Left Leg Motor Drift - No Drift for 5 Seconds + 0 6B: Test Right Leg Motor Drift - Drift, but doesn't hit bed + 1 7: Test Limb Ataxia (FNF/Heel-Shin) - No Ataxia + 0 8: Test Sensation - Mild-Moderate Loss: Can Sense Being Touched + 1 9: Test Language/Aphasia - Normal; No aphasia + 0 10: Test Dysarthria - Normal + 0 11: Test Extinction/Inattention - No abnormality + 0  NIHSS Score: 2 NIHSS Free Text : Per patient, baseline L arm weakness with subtle LUE vertical drift. On exam, R lower extremity vertical drift and decreased sensation which is new for patient  Spoke with : Dr Jossie    This consult was conducted in real time using interactive audio and Immunologist. Patient was informed of the technology being used for this visit and agreed to proceed. Patient located in hospital and provider located at home/office setting.  Patient is being evaluated for possible acute neurologic impairment  and high probability of imminent or life - threatening deterioration.I spent total of 25 minutes providing care to this patient, including time for face to face visit via telemedicine, review of medical records, imaging studies and discussion of findings with providers, the patient and / or family.   Dr Alonna Sanders     [facility] For Inpatient follow-up with [facility] physician please call [facility] at [phone]. As we are not an outpatient service for any post hospital discharge needs please contact the hospital for assistance.  If you have any questions for the [facility] physicians or need to reconsult for clinical or diagnostic changes please contact us  via [facility] at [phone].   Signature : Alonna Sanders

## 2023-12-15 NOTE — ED Provider Notes (Signed)
 [facility] Provider Note   Event Date/Time   First MD Initiated Contact with Patient 12/14/23 2335     (approximate) History  Numbness  HPI Patricia [name] is a 58 y.o. female with a stated past medical history of CVA with residual left-sided foot drop, hypertension, hyperlipidemia who presents complaining of right sided arm and leg numbness that started at 9 AM on 12/14/2023.  Patient states that this numbness started in her right arm and then spread to the right leg.  Patient states that her right arm has somewhat improved however her left leg is still numb. ROS: Patient currently denies any vision changes, tinnitus, difficulty speaking, facial droop, sore throat, chest pain, shortness of breath, abdominal pain, nausea/vomiting/diarrhea, dysuria, or weakness/paresthesias in any extremity   Physical Exam  Triage Vital Signs: ED Triage Vitals  Encounter Vitals Group     BP 12/14/23 1826 124/89     Girls Systolic BP Percentile --      Girls Diastolic BP Percentile --      Boys Systolic BP Percentile --      Boys Diastolic BP Percentile --      Pulse Rate 12/14/23 1826 87     Resp 12/14/23 1826 18     Temp 12/14/23 1826 98.1 F (36.7 C)     Temp Source 12/14/23 1826 Oral     SpO2 12/14/23 1826 97 %     Weight 12/14/23 1823 190 lb (86.2 kg)     Height 12/14/23 1823 5' 5 (1.651 m)     Head Circumference --      Peak Flow --      Pain Score 12/14/23 1823 10     Pain Loc --      Pain Education --      Exclude from Growth Chart --    Most recent vital signs: Vitals:   12/15/23 0328 12/15/23 0437  BP: 104/80 132/86  Pulse: 84 81  Resp: 16 18  Temp: 97.7 F (36.5 C) 97.7 F (36.5 C)  SpO2: 99% 100%   General: Awake, oriented x4. CV:  Good peripheral perfusion. Resp:  Normal effort. Abd:  No distention. Other:  Middle-aged obese African-American female resting comfortably in no acute distress.  Decreased sensation to right lower extremity. ED  Results / Procedures / Treatments  Labs (all labs ordered are listed, but only abnormal results are displayed) Labs Reviewed  CBC - Abnormal; Notable for the following components:      Result Value   RBC 3.64 (*)    Hemoglobin 11.8 (*)    HCT 35.0 (*)    All other components within normal limits  COMPREHENSIVE METABOLIC PANEL WITH GFR - Abnormal; Notable for the following components:   Creatinine, Ser 1.01 (*)    Calcium  8.8 (*)    AST 42 (*)    All other components within normal limits  PROTIME-INR  APTT  DIFFERENTIAL  ETHANOL  HIV ANTIBODY (ROUTINE TESTING W REFLEX)  HEMOGLOBIN A1C   EKG ED ECG REPORT I, Artist MARLA Kerns, the attending physician, personally viewed and interpreted this ECG. Date: 12/15/2023 EKG Time: 1832 Rate: 88 Rhythm: normal sinus rhythm QRS Axis: normal Intervals: normal ST/T Wave abnormalities: normal Narrative Interpretation: no evidence of acute ischemia RADIOLOGY ED MD interpretation: CT of the head without contrast interpreted by me shows no evidence of acute abnormalities including no intracerebral hemorrhage, obvious masses, or significant edema - All radiology independently interpreted and agree with radiology assessment Official  radiology report(s): MR Cervical Spine Wo Contrast Result Date: 12/15/2023 EXAM: MRI CERVICAL SPINE WITHOUT CONTRAST 12/15/2023 02:12:00 AM TECHNIQUE: Multiplanar multisequence MRI of the cervical spine was performed. COMPARISON: None available. CLINICAL HISTORY: Myelopathy, acute, cervical spine. Pt c/o R sided arm and leg numbness that started yesterday around 0900, hx of stroke in Feb 2024. Pt states she takes Plavix . Pt also states she has been having some generalized body aches, states pain is worse in her back. Numbness in bilateral feet. FINDINGS: BONES AND ALIGNMENT: Straightening of the normal cervical lordosis. No listhesis. Vertebral body height maintained without acute or chronic fracture. Bone marrow signal  intensity within normal limits. No worrisome osseous lesions or abnormal marrow edema. SPINAL CORD: Normal spinal cord size. Normal signal morphology seen within the cervical spinal cord. No cord signal changes suggest myelopathy. SOFT TISSUES: Paraspinal soft tissues are within normal limits. No paraspinal mass. 1.8 cm left thyroid  nodule noted (series 14, image 33). Small lipoma noted at the left quadrigeminal plate cistern. Visualized brain and posterior fossa otherwise unremarkable. Craniovertebral junction within normal limits. Preserved flow void seen within the vertebral arteries bilaterally. C2-C3: Unremarkable. C3-C4: Broad based central disc protrusion and density ventral thecal sac. Mild left sided facet hypertrophy. Mild spinal stenosis. Foramina remain patent. C4-C5: Diffuse disc bulge with bilateral uncovertebral spurring. Flattening and partial effacement of the ventral thecal sac with resulted mild spinal stenosis. Mild to moderate left with mild right C5 foraminal narrowing. C5-C6: Degenerative vertebral disc space narrowing with diffuse disc osteophyte complex. Broad posterior component flattens and partially effaces the ventral thecal sac, asymmetric to the left. Minimal cord flattening without cord signal changes. Borderline mild spinal stenosis. Uncovertebral spurring with mild left C6 foraminal narrowing. Right normal foraminal remains patent. C6-C7: Degenerative vertebral disc space narrowing with diffuse disc osteophyte complex. No spinal stenosis. Mild left C7 foraminal narrowing. Right normal foraminal remains patent. C7-T1: Unremarkable. IMPRESSION: 1. Normal MRI appearance of the cervical spinal cord. 2. Multilevel cervical spondylosis with resultant mild spinal stenosis at C3-C4 through C5, C6. 3. Mild to moderate left-sided foraminal narrowing at C5 through C7 as above. 4. 1.8 cm left thyroid  nodule. This has been previously evaluated bye thyroid  ultrasound on 07/26/2017. Please refer  to this exam regarding any potential follow-up recommendations regarding this finding. Electronically signed by: Morene Hoard MD 12/15/2023 03:17 AM EDT RP Workstation: [id number]   MR BRAIN WO CONTRAST Result Date: 12/15/2023 EXAM: MRI BRAIN WITHOUT CONTRAST 12/15/2023 02:12:00 AM TECHNIQUE: Multiplanar multisequence MRI of the head/brain was performed without the administration of intravenous contrast. COMPARISON: CT from 12/14/2023. CLINICAL HISTORY: Neuro deficit, acute, stroke suspected. Pt c/o R sided arm and leg numbness that started yesterday around 0900, hx of stroke in Feb 2024. Pt states she takes Plavix . Pt also states she has been having some generalized body aches, states pain is worse in her back. Numbnes in bilateral feet. FINDINGS: BRAIN AND VENTRICLES: No acute infarct. Few small remote lacunar infarcts present about the right basal ganglia. Chronic hemosiderin staining present within this region. No intracranial hemorrhage. No concerning mass. 5 mm lipoma along the left quadrigeminal plate cistern noted. No midline shift. No hydrocephalus. The sella is unremarkable. Normal flow voids. ORBITS: Probable small dacrystocele at the left medial canthus measures 9 mm, relatively stable from prior. SINUSES AND MASTOIDS: Mild scattered mucosal thickening about the ulnar air cells and maxillary sinuses. Few small maxillary sinus retention cysts noted. Trace left mastoid effusion noted, with opacification. BONES AND SOFT TISSUES: Normal marrow signal.  No acute soft tissue abnormality. IMPRESSION: 1. No acute intracranial abnormality. 2. Remote right basal ganglia infarct. 3. Otherwise stable and essentially normal brain MRI for age. Chronic incidental findings as above. Electronically signed by: Morene Hoard MD 12/15/2023 03:06 AM EDT RP Workstation: [id number]   CT HEAD WO CONTRAST Result Date: 12/14/2023 EXAM: CT HEAD WITHOUT CONTRAST 12/14/2023 06:39:45 PM TECHNIQUE: CT of the head  was performed without the administration of intravenous contrast. Automated exposure control, iterative reconstruction, and/or weight based adjustment of the mA/kV was utilized to reduce the radiation dose to as low as reasonably achievable. COMPARISON: Compared to 12/27/2022. CLINICAL HISTORY: Neuro deficit, acute, stroke suspected. Pt via POV from home. Pt c/o R sided arm and leg numbness that started yesterday around 0900, hx of stroke in Feb 2024. Pt states she takes Plavix . Pt also states she has been having some generalized body aches, states pain is worse in her back. Denies any cough/congestion. FINDINGS: BRAIN AND VENTRICLES: No acute hemorrhage. No evidence of acute infarct. No hydrocephalus. No extra-axial collection. No mass effect or midline shift. Subcentimeter lipoma near the quadrigeminal plate cistern, stable. ORBITS: No acute abnormality. SINUSES: No acute abnormality. SOFT TISSUES AND SKULL: No acute soft tissue abnormality. No skull fracture. IMPRESSION: 1. Stable and essentially normal head CT. no acute intracranial abnormality. Electronically signed by: Morene Hoard MD 12/14/2023 07:09 PM EDT RP Workstation: [id number]   PROCEDURES: Critical Care performed: Yes, see critical care procedure note(s) Procedures CRITICAL CARE Performed by: Natelie Ostrosky K Jeremie Abdelaziz   Total critical care time: 37 minutes  Critical care time was exclusive of separately billable procedures and treating other patients.  Critical care was necessary to treat or prevent imminent or life-threatening deterioration.  Critical care was time spent personally by me on the following activities: development of treatment plan with patient and/or surrogate as well as nursing, discussions with consultants, evaluation of patient's response to treatment, examination of patient, obtaining history from patient or surrogate, ordering and performing treatments and interventions, ordering and review of laboratory studies,  ordering and review of radiographic studies, pulse oximetry and re-evaluation of patient's condition.  MEDICATIONS ORDERED IN ED: Medications  aspirin  chewable tablet 81 mg (has no administration in time range)  hydrochlorothiazide  (HYDRODIURIL ) tablet 25 mg (has no administration in time range)  NIFEdipine (PROCARDIA-XL/NIFEDICAL-XL) 24 hr tablet 30 mg (has no administration in time range)  rosuvastatin  (CRESTOR ) tablet 20 mg (has no administration in time range)  citalopram  (CELEXA ) tablet 20 mg (has no administration in time range)  clopidogrel  (PLAVIX ) tablet 75 mg (has no administration in time range)   stroke: early stages of recovery book (has no administration in time range)  0.9 %  sodium chloride  infusion (has no administration in time range)  acetaminophen  (TYLENOL ) tablet 650 mg (has no administration in time range)    Or  acetaminophen  (TYLENOL ) 160 MG/5ML solution 650 mg (has no administration in time range)    Or  acetaminophen  (TYLENOL ) suppository 650 mg (has no administration in time range)  senna-docusate (Senokot-S) tablet 1 tablet (has no administration in time range)  enoxaparin  (LOVENOX ) injection 45 mg (has no administration in time range)  traZODone (DESYREL) tablet 25 mg (has no administration in time range)  ondansetron  (ZOFRAN ) injection 4 mg (has no administration in time range)  clopidogrel  (PLAVIX ) tablet 300 mg (300 mg Oral Given 12/15/23 0130)  aspirin  tablet 325 mg (325 mg Oral Given 12/15/23 0131)  tiZANidine (ZANAFLEX) tablet 2 mg (2 mg Oral Given 12/15/23 0131)  IMPRESSION / MDM / ASSESSMENT AND PLAN / ED COURSE  I reviewed the triage vital signs and the nursing notes.                             The patient is on the cardiac monitor to evaluate for evidence of arrhythmia and/or significant heart rate changes. Patient's presentation is most consistent with acute presentation with potential threat to life or bodily function. Patient is a 58 year old  female with the above-stated past medical history who presents complaining of right sided numbness in the upper and lower extremity. DDx: CVA, TIA, radiculopathy, nerve impingement Plan: CBC, CMP, PT/INR, ethanol, head CT Neurology consult  On-call neurologist recommends MRI of the head, spine, and inpatient stroke workup.  I spoken to Dr. Lawence and the internal medicine service who agrees with plan for admission for further evaluation and management.  Dispo: Admit to medicine   FINAL CLINICAL IMPRESSION(S) / ED DIAGNOSES   Final diagnoses:  Right upper extremity numbness  Numbness and tingling of right lower extremity   Rx / DC Orders   ED Discharge Orders     None      Note:  This document was prepared using Dragon voice recognition software and may include unintentional dictation errors.   Ashlee Player K, MD 12/15/23 [phone]

## 2023-12-15 NOTE — Progress Notes (Signed)
 Echocardiogram 2D Echocardiogram has been performed.  Patricia [name] 12/15/2023, 11:28 AM

## 2023-12-15 NOTE — Assessment & Plan Note (Signed)
-   Will continue statin therapy and check lipid profile.

## 2023-12-15 NOTE — Evaluation (Signed)
 Physical Therapy Evaluation Patient Details Name: Patricia [name] MRN: [id number] DOB: 06/04/1965 Today's Date: 12/15/2023  History of Present Illness  Patricia [name] is a 58 y.o. 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 Noncontrasted  head CT scan showed no acute intracranial abnormalities.  Clinical Impression  Pt is pleasant 59 y.o. female admitted for TIA. Pt tearful during session. Prior to hospitalization pt independent with ADLs and amb without AD.  Pt requires CGA for supine>sit and demonstrated increased effort for OOB. Pt initially used RW for amb during session however was able to progress to [facility] and amb ~27ft using the cane with CGA. Verbal cues provided for [facility] management while amb.  Pt able to perform STS and step pivot transfer with CGA for safety. Pt demonstrates deficits in activity tolerance, strength, balance. Would benefit from skilled PT to address above deficits and promote optimal return to PLOF.         If plan is discharge home, recommend the following: A little help with walking and/or transfers;A little help with bathing/dressing/bathroom   Can travel by private vehicle        Equipment Recommendations Cane  Recommendations for Other Services       Functional Status Assessment Patient has had a recent decline in their functional status and demonstrates the ability to make significant improvements in function in a reasonable and predictable amount of time.     Precautions / Restrictions Precautions Precautions: Fall Recall of Precautions/Restrictions: Intact Restrictions Weight Bearing Restrictions Per Provider Order: No      Mobility  Bed Mobility Overal bed mobility: Needs Assistance Bed Mobility: Supine to Sit     Supine to sit: Contact guard, Used rails, HOB  elevated     General bed mobility comments: Able to assist self to EOB. HOB elevated    Transfers Overall transfer level: Needs assistance Equipment used: Rolling walker (2 wheels), Straight cane Transfers: Sit to/from Stand, Bed to chair/wheelchair/BSC Sit to Stand: Contact guard assist   Step pivot transfers: Contact guard assist       General transfer comment: Able to perform STS from lowest bed height and toilet using grab bars. Attempt to use SPC for STS from bed and pt was successful without LOB.    Ambulation/Gait Ambulation/Gait assistance: Contact guard assist Gait Distance (Feet): 100 Feet Assistive device: Rolling walker (2 wheels), Straight cane Gait Pattern/deviations: Step-through pattern, Decreased weight shift to left, Decreased step length - right Gait velocity: dec     General Gait Details: Use of RW initially however pt reports difficulty with holding onto RW with LUE and would be comfortable using SPC. Pt able to amb with SPC without LOB, however with decreased speed. Able to take bwd steps towards chair. Verbal cuing for [facility] placement. CGA throughout for safety. Decreased heel strike on LLE  Stairs            Wheelchair Mobility     Tilt Bed    Modified Rankin (Stroke Patients Only)       Balance Overall balance assessment: Needs assistance Sitting-balance support: Feet supported, No upper extremity supported Sitting balance-Leahy Scale: Good Sitting balance - Comments: Able to weight shift and maintain balance   Standing balance support: Single extremity supported, During functional activity, Reliant on assistive device for balance Standing balance-Leahy Scale: Fair Standing balance comment:  No LOB using RW.                             Pertinent Vitals/Pain Pain Assessment Pain Assessment: No/denies pain    Home Living Family/patient expects to be discharged to:: Private residence Living Arrangements: Alone Available  Help at Discharge: Family;Available 24 hours/day (pt reports son often stays with her but doesn't live there) Type of Home: Apartment Home Access: Level entry       Home Layout: One level Home Equipment: Rolling Walker (2 wheels) Additional Comments: Has RW, but does not use.    Prior Function Prior Level of Function : Independent/Modified Independent;Driving             Mobility Comments: [employer], denies falls but endorses at least one near miss. ADLs Comments: Independent, drives, grocery shops, cares for grandchildren.     Extremity/Trunk Assessment   Upper Extremity Assessment Upper Extremity Assessment: Overall WFL for tasks assessed;LUE deficits/detail LUE Deficits / Details: L weakness d/t previous CVA    Lower Extremity Assessment Lower Extremity Assessment: Generalized weakness    Cervical / Trunk Assessment Cervical / Trunk Assessment: Normal  Communication   Communication Communication: No apparent difficulties    Cognition Arousal: Alert Behavior During Therapy: WFL for tasks assessed/performed   PT - Cognitive impairments: No apparent impairments                       PT - Cognition Comments: Pt pleasant and agreeable to PT. Tearful during session d/t being in hospital. Following commands: Intact       Cueing Cueing Techniques: Verbal cues     General Comments General comments (skin integrity, edema, etc.): Vitals monitored, HR noted to be ~90-100's at rest per tele monitor. RN aware.    Exercises Other Exercises Other Exercises: Pt educated on safety with DME.   Assessment/Plan    PT Assessment Patient needs continued PT services  PT Problem List Decreased strength;Decreased activity tolerance;Decreased balance;Decreased mobility;Decreased knowledge of use of DME       PT Treatment Interventions DME instruction;Gait training;Functional mobility training;Therapeutic activities;Therapeutic exercise;Balance  training;Neuromuscular re-education;Patient/family education    PT Goals (Current goals can be found in the Care Plan section)  Acute Rehab PT Goals Patient Stated Goal: to go home PT Goal Formulation: With patient Time For Goal Achievement: 12/29/23 Potential to Achieve Goals: Good    Frequency Min 2X/week     Co-evaluation               AM-PAC PT 6 Clicks Mobility  Outcome Measure Help needed turning from your back to your side while in a flat bed without using bedrails?: None Help needed moving from lying on your back to sitting on the side of a flat bed without using bedrails?: A Little Help needed moving to and from a bed to a chair (including a wheelchair)?: A Little Help needed standing up from a chair using your arms (e.g., wheelchair or bedside chair)?: A Little Help needed to walk in hospital room?: A Little Help needed climbing 3-5 steps with a railing? : A Little 6 Click Score: 19    End of Session Equipment Utilized During Treatment: Gait belt Activity Tolerance: Patient tolerated treatment well Patient left: in chair;with call bell/phone within reach;with chair alarm set Nurse Communication: Mobility status PT Visit Diagnosis: Unsteadiness on feet (R26.81);Muscle weakness (generalized) (M62.81)    Time: 8678-8644 PT Time Calculation (min) (  ACUTE ONLY): 34 min   Charges:                 Desmon Hitchner, SPT   Fancy Dunkley 12/15/2023, 2:45 PM

## 2023-12-15 NOTE — Progress Notes (Signed)
 PHARMACIST - PHYSICIAN COMMUNICATION  CONCERNING:  Enoxaparin  (Lovenox ) for DVT Prophylaxis    RECOMMENDATION: Patient was prescribed enoxaprin 40mg  q24 hours for VTE prophylaxis.   Filed Weights   12/14/23 1823 12/15/23 0437  Weight: 86.2 kg (190 lb) 89.9 kg (198 lb 3.1 oz)    Body mass index is 32.98 kg/m.  Estimated Creatinine Clearance: 68.9 mL/min (A) (by C-G formula based on SCr of 1.01 mg/dL (H)).   Based on [facility] policy patient is candidate for enoxaparin  0.5mg /kg TBW SQ every 24 hours based on BMI being >30.  DESCRIPTION: Pharmacy has adjusted enoxaparin  dose per [facility] policy.  Patient is now receiving enoxaparin  0.5 mg/kg every 24 hours   Rankin CANDIE Dills, PharmD, [facility] 12/15/2023 6:43 AM

## 2023-12-15 NOTE — Assessment & Plan Note (Signed)
The patient has no current exacerbation.

## 2023-12-16 ENCOUNTER — Other Ambulatory Visit: Payer: Self-pay

## 2023-12-16 DIAGNOSIS — G459 Transient cerebral ischemic attack, unspecified: Secondary | ICD-10-CM | POA: Diagnosis not present

## 2023-12-16 LAB — LIPID PANEL
Cholesterol: 117 mg/dL (ref 0–200)
HDL: 36 mg/dL — ABNORMAL LOW (ref >40)
LDL Cholesterol: 56 mg/dL (ref 0–99)
Total CHOL/HDL Ratio: 3.3 ratio
Triglycerides: 126 mg/dL (ref <150)
VLDL: 25 mg/dL (ref 0–40)

## 2023-12-16 LAB — VITAMIN B12: Vitamin B-12: 234 pg/mL (ref 180–914)

## 2023-12-16 LAB — HIV ANTIBODY (ROUTINE TESTING W REFLEX): HIV Screen 4th Generation wRfx: NONREACTIVE

## 2023-12-16 NOTE — Progress Notes (Signed)
 Occupational Therapy Treatment Patient Details Name: Patricia [name] MRN: [id number] DOB: 13-Sep-1965 Today's Date: 12/16/2023   History of present illness Patricia [name] is a 58 y.o. 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 Noncontrasted  head CT scan showed no acute intracranial abnormalities.   OT comments  Pt received semi-reclined in bed. Appearing alert; willing to work with OT on functional mobility and education about safety in showering. T/f supervision to standing; used SPC for mobility; very slow cadence (pt notes she is walking slower than baseline). See flowsheet below for further details of session. Left seated in recliner with all needs in reach.  Patient will benefit from continued OT while in acute care.        If plan is discharge home, recommend the following:  A little help with walking and/or transfers;A little help with bathing/dressing/bathroom;Help with stairs or ramp for entrance;Assist for transportation;Assistance with cooking/housework   Equipment Recommendations  Tub/shower seat    Recommendations for Other Services      Precautions / Restrictions Precautions Precautions: Fall Recall of Precautions/Restrictions: Intact Restrictions Weight Bearing Restrictions Per Provider Order: No       Mobility Bed Mobility Overal bed mobility: Modified Independent Bed Mobility: Supine to Sit     Supine to sit: Modified independent (Device/Increase time)     General bed mobility comments: extra time    Transfers Overall transfer level: Modified independent Equipment used: Straight cane Transfers: Sit to/from Stand Sit to Stand: Supervision                 Balance Overall balance assessment: Mild deficits observed, not formally tested                                          ADL either performed or assessed with clinical judgement   ADL Overall ADL's : Modified independent                                       General ADL Comments: Pt able to don/doff BIL non-skid socks at bed level. We discussed shower safety (pt wanting to shower; OT reached out to MD and RN for approval; OT talked through safe showering- shower seat, handheld shower head, use of grab bars) and went into bathroom to show pt shower safety features. Pt able to mobilize with supervision with SPC at slow pace, no overt loss of balance, into hallway x10 minutes approx 200 feet.    Extremity/Trunk Assessment Upper Extremity Assessment Upper Extremity Assessment: Overall WFL for tasks assessed LUE Deficits / Details: L weakness d/t previous CVA   Lower Extremity Assessment Lower Extremity Assessment: Generalized weakness;Defer to PT evaluation        Vision       Perception     Praxis     Communication Communication Communication: No apparent difficulties   Cognition Arousal: Alert Behavior During Therapy: WFL for tasks assessed/performed Cognition: No apparent impairments             OT - Cognition Comments: Very pleasant                 Following commands:  Intact        Cueing   Cueing Techniques: Verbal cues  Exercises      Shoulder Instructions       General Comments Pt with increased activity tolerance today; continues to have fatigue, but able to mobilize household distance.    Pertinent Vitals/ Pain       Pain Assessment Pain Assessment: 0-10 Pain Score:  (unrated, low) Pain Location: L toes Pain Descriptors / Indicators: Aching, Sore Pain Intervention(s): Limited activity within patient's tolerance  Home Living                                          Prior Functioning/Environment              Frequency  Min 2X/week        Progress Toward Goals  OT Goals(current  goals can now be found in the care plan section)  Progress towards OT goals: Progressing toward goals  Acute Rehab OT Goals Patient Stated Goal: To go home OT Goal Formulation: With patient Time For Goal Achievement: 12/29/23 Potential to Achieve Goals: Good ADL Goals Pt Will Perform Grooming: sitting;with modified independence;standing Pt Will Perform Lower Body Dressing: with set-up;with supervision;sit to/from stand;with adaptive equipment Pt Will Transfer to Toilet: bedside commode;regular height toilet;ambulating;with set-up;with supervision Pt Will Perform Toileting - Clothing Manipulation and hygiene: with set-up;with supervision;sit to/from stand;with adaptive equipment  Plan      Co-evaluation                 AM-PAC OT 6 Clicks Daily Activity     Outcome Measure   Help from another person eating meals?: None Help from another person taking care of personal grooming?: None Help from another person toileting, which includes using toliet, bedpan, or urinal?: None Help from another person bathing (including washing, rinsing, drying)?: A Little Help from another person to put on and taking off regular upper body clothing?: None Help from another person to put on and taking off regular lower body clothing?: None 6 Click Score: 23    End of Session Equipment Utilized During Treatment: Gait belt;Other (comment) (SPC)  OT Visit Diagnosis: Other abnormalities of gait and mobility (R26.89);Muscle weakness (generalized) (M62.81)   Activity Tolerance Patient tolerated treatment well   Patient Left in chair;with chair alarm set;with call bell/phone within reach   Nurse Communication Mobility status;Other (comment) (desire to shower)        Time: 8864-8796 OT Time Calculation (min): 28 min  Charges: OT General Charges $OT Visit: 1 Visit OT Treatments $Self Care/Home Management : 8-22 mins $Therapeutic Activity: 8-22 mins  [name], MS, OTR/L  [name] 12/16/2023, 1:00 PM

## 2023-12-16 NOTE — TOC Transition Note (Signed)
 Transition of Care [facility]) - Discharge Note   Patient Details  Name: Patricia [name] MRN: [id number] Date of Birth: 1966-01-12  [facility] CM/SW Contact:  Marinda Cooks, RN Phone Number: 12/16/2023, 4:48 PM   Clinical Narrative:    This CM updated by covering MD pt medically cleared to dc today and has active DC order . This CM referrals or HH per MD order awaiting replies  DC transportation confirmed for pt with family Medical team updated . No additional DC needs requested by medical team or identified by CM at this time .     Final next level of care: Home w Home Health Services Barriers to Discharge: No Barriers Identified   Patient Goals and CMS Choice            Discharge Placement                       Discharge Plan and Services Additional resources added to the After Visit Summary for                                       Social Drivers of Health (SDOH) Interventions SDOH Screenings   Food Insecurity: No Food Insecurity (12/15/2023)  Housing: Unknown (12/15/2023)  Transportation Needs: Patient Declined (12/15/2023)  Utilities: Not At Risk (12/15/2023)  Financial Resource Strain: High Risk (05/22/2023)   Received from [facility]  Social Connections: Unknown (12/15/2023)  Tobacco Use: Medium Risk (12/14/2023)     Readmission Risk Interventions     No data to display

## 2023-12-16 NOTE — Plan of Care (Signed)
Plan of care ongoing.

## 2023-12-16 NOTE — Discharge Summary (Signed)
 Physician Discharge Summary   Patient: Patricia [name] MRN: [id number] DOB: December 13, 1965  Admit date:     12/14/2023  Discharge date: 12/16/23  Discharge Physician: Burnard DELENA Cunning   PCP: Buren Rock HERO, MD   Recommendations at discharge:   Follow up outpatient with Neurology Follow up with Primary Care in 1-2 weeks Repeat CBC, CMP at follow up Follow up on BP's and resume hydrochlorothiazide  if/when needed.  BP's were controlled while this was held during admission.   Discharge Diagnoses: Principal Problem:   TIA (transient ischemic attack) Active Problems:   Essential hypertension   Asthma, chronic   Dyslipidemia   Depression  Resolved Problems:   * No resolved hospital problems. [facility] Course:  HPI on admission: Patricia [name] is a 58 y.o. 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  While the patient was in the emergency room her right arm numbness has improved but her right leg was still numb.   See H&P for full HPI on admission & ED course.   Patient was admitted for further evaluation and management as outlined in detail below.     Assessment and Plan:  TIA - presenting with right sided sensory numbness.   CT head: Stable and essentially normal head CT. no acute intracranial abnormality.  - CTA of head and neck: Distal right CCA through proximal ICA stent is patent and stable from prior CTA. No large vessel occlusion, significant atherosclerosis or stenosis in the head or neck. No new intracranial abnormality. Subtle chronic right basal ganglia infarct. - MRI brain: No acute intracranial abnormality. Remote right basal ganglia infarct. Otherwise stable and essentially normal brain MRI for age. Chronic incidental findings are noted. - MRI C-spine: Normal MRI appearance of  the cervical spinal cord. Multilevel cervical spondylosis with resultant mild spinal stenosis at C3-C4 through C5, C6. Mild to moderate left-sided foraminal narrowing at C5 through C7.  - TTE report with normal EF. No mural thrombus or valvular vegetation mentioned in the report.   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   Depression --Continue Celexa .       Consultants: Neurology Procedures performed: As above  Disposition: Home health Diet recommendation:  Discharge Diet Orders (From admission, onward)     Start     Ordered   12/16/23 0000  Diet - low sodium heart healthy        12/16/23 1321            DISCHARGE MEDICATION: Allergies as of 12/16/2023       Reactions   Tramadol  Rash        Medication List     PAUSE taking these medications    hydrochlorothiazide  25 MG tablet Wait to take this until your doctor or other care provider tells you to start again.  Commonly known as: HYDRODIURIL  Take 25 mg by mouth daily.       STOP taking these medications    Klor-Con  M20 20 MEQ tablet Generic drug: potassium chloride  SA       TAKE these medications    albuterol  108 (90 Base) MCG/ACT inhaler Commonly known as: VENTOLIN  HFA Inhale 2 puffs into the lungs every 6 (six) hours as needed for wheezing or shortness of breath.   Aspirin  Low Dose 81 MG  chewable tablet Generic drug: aspirin  Chew 1 tablet (81 mg total) by mouth daily.   citalopram  20 MG tablet Commonly known as: CELEXA  Take 20 mg by mouth at bedtime.   clopidogrel  75 MG tablet Commonly known as: PLAVIX  Take 75 mg by mouth daily.   cyclobenzaprine  10 MG tablet Commonly known as: FLEXERIL  Take 10 mg by mouth 3 (three) times daily.   diphenhydrAMINE  25 mg capsule Commonly known as: Benadryl  Allergy Take 1 capsule (25 mg total) by mouth every 6 (six) hours as needed for up to 5 days.   gabapentin 100 MG capsule Commonly known as: NEURONTIN Take 100 mg by mouth 2 (two) times daily.   levETIRAcetam  500 MG tablet Commonly known as: KEPPRA  Take 1 tablet (500 mg total) by mouth 2 (two) times daily.   NIFEdipine 90 MG 24 hr tablet Commonly known as: ADALAT CC Take 30 mg by mouth daily.   ondansetron  4 MG tablet Commonly known as: ZOFRAN  Take 4 mg by mouth every 8 (eight) hours as needed for nausea.   Potassium Chloride  ER 20 MEQ Tbcr Take 1 tablet by mouth daily.   rosuvastatin  20 MG tablet Commonly known as: CRESTOR  Take 1 tablet (20 mg total) by mouth daily.   tiZANidine 4 MG tablet Commonly known as: ZANAFLEX Take 4 mg by mouth at bedtime.   Vitamin D  (Ergocalciferol ) 1.25 MG (50000 UNIT) Caps capsule Commonly known as: DRISDOL  Take 50,000 Units by mouth once a week.        Follow-up Information     Buren Rock HERO, MD Follow up.   Specialty: Family Medicine Why: hospital follow up Contact information: [address] [place] [address] [phone]                Discharge Exam: Filed Weights   12/14/23 1823 12/15/23 0437  Weight: 86.2 kg 89.9 kg   General exam: awake, alert, no acute distress HEENT: atraumatic, clear conjunctiva, anicteric sclera, moist mucus membranes, hearing grossly normal  Respiratory system: CTAB, no wheezes, rales or rhonchi, normal respiratory effort. Cardiovascular system: normal S1/S2, RRR, no  JVD, murmurs, rubs, gallops, no pedal edema.   Gastrointestinal system: soft, NT, ND, no HSM felt, +bowel sounds. Central nervous system: A&O x3. no gross focal neurologic deficits, normal speech Extremities: moves all , no edema, normal tone Skin: dry, intact, normal temperature, normal color, No rashes, lesions or ulcers Psychiatry: normal mood, congruent affect, judgement and insight appear normal   Condition at discharge: stable  The results of significant diagnostics from this hospitalization (including imaging, microbiology, ancillary and laboratory) are listed below for reference.   Imaging Studies: ECHOCARDIOGRAM COMPLETE BUBBLE STUDY Result Date: 12/15/2023    ECHOCARDIOGRAM REPORT   Patient Name:   LAURNA SHETLEY Date of Exam: 12/15/2023 Medical Rec #:  [id number]       Height:       65.0 in Accession #:    [id number]      Weight:       198.2 lb Date  of Birth:  September 13, 1965       BSA:          1.971 m Patient Age:    58 years        BP:           132/86 mmHg Patient Gender: F               HR:           88 bpm. Exam Location:  [facility] Procedure: 2D Echo, Cardiac Doppler, Color Doppler and Saline Contrast Bubble            Study (Both Spectral and Color Flow Doppler were utilized during            procedure). Indications:     Stroke 434.91/ I63.9  History:         Patient has prior history of Echocardiogram examinations, most                  recent 04/11/2022. Stroke and TIA, Signs/Symptoms:Syncope; Risk                  Factors:Hypertension and Dyslipidemia.  Sonographer:     Thea Norlander RCS Referring Phys:  [id number] MADISON A MANSY Diagnosing Phys: Redell Cave MD IMPRESSIONS  1. Left ventricular ejection fraction, by estimation, is 60 to 65%. The left ventricle has normal function. The left ventricle has no regional wall motion abnormalities. Left ventricular diastolic parameters are consistent with Grade I diastolic dysfunction (impaired relaxation).  2. Right ventricular systolic  function is normal. The right ventricular size is normal.  3. The mitral valve is normal in structure. No evidence of mitral valve regurgitation.  4. The aortic valve was not well visualized. Aortic valve regurgitation is not visualized.  5. The inferior vena cava is normal in size with greater than 50% respiratory variability, suggesting right atrial pressure of 3 mmHg.  6. Agitated saline contrast bubble study was negative, with no evidence of any interatrial shunt. FINDINGS  Left Ventricle: Left ventricular ejection fraction, by estimation, is 60 to 65%. The left ventricle has normal function. The left ventricle has no regional wall motion abnormalities. The left ventricular internal cavity size was normal in size. There is  no left ventricular hypertrophy. Left ventricular diastolic parameters are consistent with Grade I diastolic dysfunction (impaired relaxation). Right Ventricle: The right ventricular size is normal. No increase in right ventricular wall thickness. Right ventricular systolic function is normal. Left Atrium: Left atrial size was normal in size. Right Atrium: Right atrial size was normal in size. Pericardium: There is no evidence of pericardial effusion. Mitral Valve: The mitral valve is normal in structure. No evidence of mitral valve regurgitation. Tricuspid Valve: The tricuspid valve is normal in structure. Tricuspid valve regurgitation is not demonstrated. Aortic Valve: The aortic valve was not well visualized. Aortic valve regurgitation is not visualized. Aortic valve peak gradient measures 4.2 mmHg. Pulmonic Valve: The pulmonic valve was not well visualized. Pulmonic valve regurgitation is not visualized. Aorta: The aortic root and ascending aorta are structurally normal, with no evidence of dilitation. Venous: The inferior vena cava is normal in size with greater than 50% respiratory variability, suggesting right atrial pressure of 3 mmHg. IAS/Shunts: No atrial level shunt detected by  color flow Doppler. Agitated saline contrast was given intravenously to evaluate for intracardiac shunting. Agitated saline contrast bubble study was negative, with no evidence of any interatrial shunt.  LEFT VENTRICLE PLAX 2D LVIDd:  4.30 cm   Diastology LVIDs:         2.60 cm   LV e' medial:    5.87 cm/s LV PW:         0.90 cm   LV E/e' medial:  9.4 LV IVS:        0.90 cm   LV e' lateral:   6.85 cm/s LVOT diam:     2.10 cm   LV E/e' lateral: 8.0 LV SV:         46 LV SV Index:   23 LVOT Area:     3.46 cm  RIGHT VENTRICLE             IVC RV S prime:     18.00 cm/s  IVC diam: 1.70 cm TAPSE (M-mode): 2.2 cm LEFT ATRIUM             Index        RIGHT ATRIUM           Index LA diam:        2.80 cm 1.42 cm/m   RA Area:     12.20 cm LA Vol (A2C):   20.6 ml 10.45 ml/m  RA Volume:   28.20 ml  14.31 ml/m LA Vol (A4C):   17.8 ml 9.03 ml/m LA Biplane Vol: 19.1 ml 9.69 ml/m  AORTIC VALVE AV Area (Vmax): 2.99 cm AV Vmax:        103.00 cm/s AV Peak Grad:   4.2 mmHg LVOT Vmax:      88.80 cm/s LVOT Vmean:     54.500 cm/s LVOT VTI:       0.132 m  AORTA Ao Root diam: 3.20 cm Ao Asc diam:  3.20 cm MITRAL VALVE               TRICUSPID VALVE MV Area (PHT): 4.39 cm    TR Peak grad:   4.5 mmHg MV Decel Time: 173 msec    TR Vmax:        106.00 cm/s MV E velocity: 55.10 cm/s MV A velocity: 84.50 cm/s  SHUNTS MV E/A ratio:  0.65        Systemic VTI:  0.13 m                            Systemic Diam: 2.10 cm Redell Cave MD Electronically signed by Redell Cave MD Signature Date/Time: 12/15/2023/12:38:39 PM    Final    CT ANGIO HEAD NECK W WO CM Result Date: 12/15/2023 EXAM: CTA Head and Neck with Intravenous Contrast. CT Head without Contrast. CLINICAL HISTORY: Neuro deficit, acute, stroke suspected. Numbness. TECHNIQUE: Axial CTA images of the head and neck performed with intravenous contrast. MIP reconstructed images were created and reviewed. Axial computed tomography images of the head/brain performed without  intravenous contrast. Note: Per PQRS, the description of internal carotid artery percent stenosis, including 0 percent or normal exam, is based on [place] American Symptomatic Carotid Endarterectomy Trial (NASCET) criteria. Dose reduction technique was used including one or more of the following: automated exposure control, adjustment of mA and kV according to patient size, and/or iterative reconstruction. CONTRAST: 75mL (iohexol  (OMNIPAQUE ) 350 MG/ML injection 75 mL IOHEXOL  350 MG/ML SOLN) COMPARISON: None provided. FINDINGS: BRAIN: No acute intraparenchymal hemorrhage. No mass lesion. No CT evidence for acute territorial infarct. No midline shift or extra-axial collection. VENTRICLES: No hydrocephalus. ORBITS: The orbits are unremarkable. SINUSES AND MASTOIDS: The paranasal sinuses and mastoid  air cells are clear. CTA NECK: AORTIC ARCH AND ARCH VESSELS: Mildly bovine arch configuration. No significant arch atherosclerosis. No dissection or arterial injury. No significant stenosis of the brachiocephalic or subclavian arteries. CERVICAL CAROTID ARTERIES: Distal right CCA through proximal right ICA vascular stent remains patent. No incident stenosis is evident. Stable CTA appearance from last year. No significant extracranial atherosclerosis or stenosis. No dissection or arterial injury. CERVICAL VERTEBRAL ARTERIES: Tortuous cervical vertebral arteries are fairly codominant. Stable distal vertebral arteries, vertebrobasilar junction, and PICA origins. No dissection, arterial injury, or significant stenosis. LUNGS AND MEDIASTINUM: Negative visible upper chest. Unremarkable. SOFT TISSUES: Chronic left thyroid  nodule was evaluated on the previous ultrasound, stable by CTA. No acute abnormality. BONES: Absent anterior dentition. Mild for age spine degeneration. No acute abnormality. CTA HEAD: ANTERIOR CIRCULATION: Both ICA siphons are patent with no significant plaque or stenosis. Normal posterior communicating artery  origins. Normal carotid terminae. Patent MCA and ACA origins. Dominant left ACA redemonstrated with azygos type A2 anatomy. Bilateral ACA branches are stable and within normal limits. Bilateral MCA branches are stable and within normal limits. No aneurysm. POSTERIOR CIRCULATION: Diminutive basilar artery is stable with fetal type bilateral PCAs. Diminutive basilar tip and SCAs remain patent. Bilateral PCA branches are stable and within normal limits. No significant stenosis of the posterior cerebral arteries. No significant stenosis of the vertebral arteries. No aneurysm. 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 Subtle chronic encephalomalacia in the right lentiform. Otherwise, gray-white differentiation appears stable and normal. No suspicious intracranial vascular hyperdensity. Major dural venous sinuses are enhancing and appear patent. No dural venous sinus thrombosis on this non-dedicated study. IMPRESSION: 1. Distal right CCA through proximal ICA stent is patent and stable from prior CTA. 2. No large vessel occlusion, significant atherosclerosis or stenosis in the head or neck. 3. No new intracranial abnormality. Subtle chronic right basal ganglia infarct. Electronically signed by: Helayne Hurst MD 12/15/2023 10:27 AM EDT RP Workstation: [id number]   MR Cervical Spine Wo Contrast Result Date: 12/15/2023 EXAM: MRI CERVICAL SPINE WITHOUT CONTRAST 12/15/2023 02:12:00 AM TECHNIQUE: Multiplanar multisequence MRI of the cervical spine was performed. COMPARISON: None available. CLINICAL HISTORY: Myelopathy, acute, cervical spine. Pt c/o R sided arm and leg numbness that started yesterday around 0900, hx of stroke in Feb 2024. Pt states she takes Plavix . Pt also states she has been having some generalized body aches, states pain is worse in her back. Numbness in bilateral feet. FINDINGS: BONES AND ALIGNMENT: Straightening of the normal cervical lordosis. No  listhesis. Vertebral body height maintained without acute or chronic fracture. Bone marrow signal intensity within normal limits. No worrisome osseous lesions or abnormal marrow edema. SPINAL CORD: Normal spinal cord size. Normal signal morphology seen within the cervical spinal cord. No cord signal changes suggest myelopathy. SOFT TISSUES: Paraspinal soft tissues are within normal limits. No paraspinal mass. 1.8 cm left thyroid  nodule noted (series 14, image 33). Small lipoma noted at the left quadrigeminal plate cistern. Visualized brain and posterior fossa otherwise unremarkable. Craniovertebral junction within normal limits. Preserved flow void seen within the vertebral arteries bilaterally. C2-C3: Unremarkable. C3-C4: Broad based central disc protrusion and density ventral thecal sac. Mild left sided facet hypertrophy. Mild spinal stenosis. Foramina remain patent. C4-C5: Diffuse disc bulge with bilateral uncovertebral spurring. Flattening and partial effacement of the ventral thecal sac with resulted mild spinal stenosis. Mild to moderate left with mild right C5 foraminal narrowing. C5-C6: Degenerative vertebral disc space narrowing with diffuse disc osteophyte  complex. Broad posterior component flattens and partially effaces the ventral thecal sac, asymmetric to the left. Minimal cord flattening without cord signal changes. Borderline mild spinal stenosis. Uncovertebral spurring with mild left C6 foraminal narrowing. Right normal foraminal remains patent. C6-C7: Degenerative vertebral disc space narrowing with diffuse disc osteophyte complex. No spinal stenosis. Mild left C7 foraminal narrowing. Right normal foraminal remains patent. C7-T1: Unremarkable. IMPRESSION: 1. Normal MRI appearance of the cervical spinal cord. 2. Multilevel cervical spondylosis with resultant mild spinal stenosis at C3-C4 through C5, C6. 3. Mild to moderate left-sided foraminal narrowing at C5 through C7 as above. 4. 1.8 cm left  thyroid  nodule. This has been previously evaluated bye thyroid  ultrasound on 07/26/2017. Please refer to this exam regarding any potential follow-up recommendations regarding this finding. Electronically signed by: Morene Hoard MD 12/15/2023 03:17 AM EDT RP Workstation: [id number]   MR BRAIN WO CONTRAST Result Date: 12/15/2023 EXAM: MRI BRAIN WITHOUT CONTRAST 12/15/2023 02:12:00 AM TECHNIQUE: Multiplanar multisequence MRI of the head/brain was performed without the administration of intravenous contrast. COMPARISON: CT from 12/14/2023. CLINICAL HISTORY: Neuro deficit, acute, stroke suspected. Pt c/o R sided arm and leg numbness that started yesterday around 0900, hx of stroke in Feb 2024. Pt states she takes Plavix . Pt also states she has been having some generalized body aches, states pain is worse in her back. Numbnes in bilateral feet. FINDINGS: BRAIN AND VENTRICLES: No acute infarct. Few small remote lacunar infarcts present about the right basal ganglia. Chronic hemosiderin staining present within this region. No intracranial hemorrhage. No concerning mass. 5 mm lipoma along the left quadrigeminal plate cistern noted. No midline shift. No hydrocephalus. The sella is unremarkable. Normal flow voids. ORBITS: Probable small dacrystocele at the left medial canthus measures 9 mm, relatively stable from prior. SINUSES AND MASTOIDS: Mild scattered mucosal thickening about the ulnar air cells and maxillary sinuses. Few small maxillary sinus retention cysts noted. Trace left mastoid effusion noted, with opacification. BONES AND SOFT TISSUES: Normal marrow signal. No acute soft tissue abnormality. IMPRESSION: 1. No acute intracranial abnormality. 2. Remote right basal ganglia infarct. 3. Otherwise stable and essentially normal brain MRI for age. Chronic incidental findings as above. Electronically signed by: Morene Hoard MD 12/15/2023 03:06 AM EDT RP Workstation: [id number]   CT HEAD WO  CONTRAST Result Date: 12/14/2023 EXAM: CT HEAD WITHOUT CONTRAST 12/14/2023 06:39:45 PM TECHNIQUE: CT of the head was performed without the administration of intravenous contrast. Automated exposure control, iterative reconstruction, and/or weight based adjustment of the mA/kV was utilized to reduce the radiation dose to as low as reasonably achievable. COMPARISON: Compared to 12/27/2022. CLINICAL HISTORY: Neuro deficit, acute, stroke suspected. Pt via POV from home. Pt c/o R sided arm and leg numbness that started yesterday around 0900, hx of stroke in Feb 2024. Pt states she takes Plavix . Pt also states she has been having some generalized body aches, states pain is worse in her back. Denies any cough/congestion. FINDINGS: BRAIN AND VENTRICLES: No acute hemorrhage. No evidence of acute infarct. No hydrocephalus. No extra-axial collection. No mass effect or midline shift. Subcentimeter lipoma near the quadrigeminal plate cistern, stable. ORBITS: No acute abnormality. SINUSES: No acute abnormality. SOFT TISSUES AND SKULL: No acute soft tissue abnormality. No skull fracture. IMPRESSION: 1. Stable and essentially normal head CT. no acute intracranial abnormality. Electronically signed by: Morene Hoard MD 12/14/2023 07:09 PM EDT RP Workstation: [id number]    Microbiology: Results for orders placed or performed during the hospital encounter of 04/23/22  Resp panel by RT-PCR (RSV,  Flu A&B, Covid) Anterior Nasal Swab     Status: None   Collection Time: 04/23/22  5:52 AM   Specimen: Anterior Nasal Swab  Result Value Ref Range Status   SARS Coronavirus 2 by RT PCR NEGATIVE NEGATIVE Final    Comment: (NOTE) SARS-CoV-2 target nucleic acids are NOT DETECTED.  The SARS-CoV-2 RNA is generally detectable in upper respiratory specimens during the acute phase of infection. The lowest concentration of SARS-CoV-2 viral copies this assay can detect is 138 copies/mL. A negative result does not preclude  SARS-Cov-2 infection and should not be used as the sole basis for treatment or other patient management decisions. A negative result may occur with  improper specimen collection/handling, submission of specimen other than nasopharyngeal swab, presence of viral mutation(s) within the areas targeted by this assay, and inadequate number of viral copies(<138 copies/mL). A negative result must be combined with clinical observations, patient history, and epidemiological information. The expected result is Negative.  Fact Sheet for Patients:  BloggerCourse.com  Fact Sheet for Healthcare Providers:  SeriousBroker.it  This test is no t yet approved or cleared by the [place]  FDA and  has been authorized for detection and/or diagnosis of SARS-CoV-2 by FDA under an Emergency Use Authorization (EUA). This EUA will remain  in effect (meaning this test can be used) for the duration of the COVID-19 declaration under Section 564(b)(1) of the Act, 21 U.S.C.section 360bbb-3(b)(1), unless the authorization is terminated  or revoked sooner.       Influenza A by PCR NEGATIVE NEGATIVE Final   Influenza B by PCR NEGATIVE NEGATIVE Final    Comment: (NOTE) The Xpert Xpress SARS-CoV-2/FLU/RSV plus assay is intended as an aid in the diagnosis of influenza from Nasopharyngeal swab specimens and should not be used as a sole basis for treatment. Nasal washings and aspirates are unacceptable for Xpert Xpress SARS-CoV-2/FLU/RSV testing.  Fact Sheet for Patients: BloggerCourse.com  Fact Sheet for Healthcare Providers: SeriousBroker.it  This test is not yet approved or cleared by the [place]  FDA and has been authorized for detection and/or diagnosis of SARS-CoV-2 by FDA under an Emergency Use Authorization (EUA). This EUA will remain in effect (meaning this test can be used) for the duration of  the COVID-19 declaration under Section 564(b)(1) of the Act, 21 U.S.C. section 360bbb-3(b)(1), unless the authorization is terminated or revoked.     Resp Syncytial Virus by PCR NEGATIVE NEGATIVE Final    Comment: (NOTE) Fact Sheet for Patients: BloggerCourse.com  Fact Sheet for Healthcare Providers: SeriousBroker.it  This test is not yet approved or cleared by the [place]  FDA and has been authorized for detection and/or diagnosis of SARS-CoV-2 by FDA under an Emergency Use Authorization (EUA). This EUA will remain in effect (meaning this test can be used) for the duration of the COVID-19 declaration under Section 564(b)(1) of the Act, 21 U.S.C. section 360bbb-3(b)(1), unless the authorization is terminated or revoked.  Performed at [facility], [address] [place], [place] [address]   Culture, blood (Routine X 2) w Reflex to ID Panel     Status: None   Collection Time: 04/23/22  7:50 AM   Specimen: BLOOD  Result Value Ref Range Status   Specimen Description BLOOD LEFT ANTECUBITAL  Final   Special Requests   Final    BOTTLES DRAWN AEROBIC AND ANAEROBIC Blood Culture adequate volume   Culture   Final    NO GROWTH 5 DAYS Performed at [facility], [address] [place],  [place] [address]    Report Status 04/28/2022 FINAL  Final  Culture, blood (Routine X 2) w Reflex to ID Panel     Status: None   Collection Time: 04/23/22  7:50 AM   Specimen: BLOOD  Result Value Ref Range Status   Specimen Description BLOOD RIGHT ANTECUBITAL  Final   Special Requests   Final    BOTTLES DRAWN AEROBIC AND ANAEROBIC Blood Culture adequate volume   Culture   Final    NO GROWTH 5 DAYS Performed at [facility], [address], [place], [place] [address]    Report Status 04/28/2022 FINAL  Final  C Difficile Quick Screen w PCR reflex     Status: None   Collection Time: 04/23/22 10:30 AM   Specimen:  STOOL  Result Value Ref Range Status   C Diff antigen NEGATIVE NEGATIVE Final   C Diff toxin NEGATIVE NEGATIVE Final   C Diff interpretation No C. difficile detected.  Final    Comment: Performed at [facility], [address] [place], [place] [address]  Gastrointestinal Panel by PCR , Stool     Status: Abnormal   Collection Time: 04/23/22 10:30 AM   Specimen: Stool  Result Value Ref Range Status   Campylobacter species NOT DETECTED NOT DETECTED Final   Plesimonas shigelloides NOT DETECTED NOT DETECTED Final   Salmonella species NOT DETECTED NOT DETECTED Final   Yersinia enterocolitica NOT DETECTED NOT DETECTED Final   Vibrio species NOT DETECTED NOT DETECTED Final   Vibrio cholerae NOT DETECTED NOT DETECTED Final   Enteroaggregative E coli (EAEC) NOT DETECTED NOT DETECTED Final   Enteropathogenic E coli (EPEC) NOT DETECTED NOT DETECTED Final   Enterotoxigenic E coli (ETEC) NOT DETECTED NOT DETECTED Final   Shiga like toxin producing E coli (STEC) NOT DETECTED NOT DETECTED Final   Shigella/Enteroinvasive E coli (EIEC) NOT DETECTED NOT DETECTED Final   Cryptosporidium NOT DETECTED NOT DETECTED Final   Cyclospora cayetanensis NOT DETECTED NOT DETECTED Final   Entamoeba histolytica NOT DETECTED NOT DETECTED Final   Giardia lamblia NOT DETECTED NOT DETECTED Final   Adenovirus F40/41 NOT DETECTED NOT DETECTED Final   Astrovirus NOT DETECTED NOT DETECTED Final   Norovirus GI/GII NOT DETECTED NOT DETECTED Final   Rotavirus A NOT DETECTED NOT DETECTED Final   Sapovirus (I, II, IV, and V) DETECTED (A) NOT DETECTED Final    Comment: Performed at [facility], [address] [place], [place] [address]    Labs: CBC: Recent Labs  Lab 12/14/23 1828  WBC 6.7  NEUTROABS 3.4  HGB 11.8*  HCT 35.0*  MCV 96.2  PLT 265   Basic Metabolic Panel: Recent Labs  Lab 12/14/23 1828  NA 136  K 3.7  CL 100  CO2 28  GLUCOSE 89  BUN 15  CREATININE 1.01*  CALCIUM  8.8*    Liver Function Tests: Recent Labs  Lab 12/14/23 1828  AST 42*  ALT 15  ALKPHOS 95  BILITOT 0.6  PROT 7.3  ALBUMIN 3.9   CBG: No results for input(s): GLUCAP in the last 168 hours.  Discharge time spent: less than 30 minutes.  Signed: Burnard DELENA Cunning, DO [facility] 12/16/2023

## 2023-12-27 ENCOUNTER — Emergency Department

## 2023-12-27 ENCOUNTER — Other Ambulatory Visit: Payer: Self-pay

## 2023-12-27 ENCOUNTER — Emergency Department
Admission: EM | Admit: 2023-12-27 | Discharge: 2023-12-27 | Disposition: A | Discharge status: Home / Self Care | Attending: Emergency Medicine | Admitting: Emergency Medicine

## 2023-12-27 DIAGNOSIS — Z8673 Personal history of transient ischemic attack (TIA), and cerebral infarction without residual deficits: Secondary | ICD-10-CM | POA: Diagnosis not present

## 2023-12-27 DIAGNOSIS — M25571 Pain in right ankle and joints of right foot: Secondary | ICD-10-CM | POA: Insufficient documentation

## 2023-12-27 DIAGNOSIS — S6991XA Unspecified injury of right wrist, hand and finger(s), initial encounter: Secondary | ICD-10-CM | POA: Diagnosis present

## 2023-12-27 DIAGNOSIS — M25551 Pain in right hip: Secondary | ICD-10-CM | POA: Insufficient documentation

## 2023-12-27 DIAGNOSIS — S60211A Contusion of right wrist, initial encounter: Secondary | ICD-10-CM | POA: Diagnosis not present

## 2023-12-27 DIAGNOSIS — W19XXXA Unspecified fall, initial encounter: Secondary | ICD-10-CM

## 2023-12-27 DIAGNOSIS — W108XXA Fall (on) (from) other stairs and steps, initial encounter: Secondary | ICD-10-CM | POA: Insufficient documentation

## 2023-12-27 DIAGNOSIS — M25511 Pain in right shoulder: Secondary | ICD-10-CM | POA: Diagnosis not present

## 2023-12-27 MED ORDER — KETOROLAC TROMETHAMINE 30 MG/ML IJ SOLN
30.0000 mg | Freq: Once | INTRAMUSCULAR | Status: AC
  Administered 2023-12-27: 30 mg via INTRAMUSCULAR
  Filled 2023-12-27: qty 1

## 2023-12-27 NOTE — ED Provider Notes (Signed)
 [facility] Provider Note    Event Date/Time   First MD Initiated Contact with Patient 12/27/23 1541     (approximate)   History   Fall   HPI  Patricia [name] is a 58 y.o. 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  She lost her balance.  She complains of pain in her right shoulder right wrist right ankle and right hip.  Denies head injury, denies blood thinners.  No back pain.  No abdominal or chest pain, no pain in the left extremity     Physical Exam   Triage Vital Signs: ED Triage Vitals  Encounter Vitals Group     BP 12/27/23 1532 (!) 141/98     Girls Systolic BP Percentile --      Girls Diastolic BP Percentile --      Boys Systolic BP Percentile --      Boys Diastolic BP Percentile --      Pulse Rate 12/27/23 1532 (!) 107     Resp 12/27/23 1532 18     Temp 12/27/23 1532 98.2 F (36.8 C)     Temp Source 12/27/23 1532 Oral     SpO2 12/27/23 1532 99 %     Weight 12/27/23 1532 86.2 kg (190 lb)     Height 12/27/23 1532 1.651 m (5' 5)     Head Circumference --      Peak Flow --      Pain Score 12/27/23 1534 10     Pain Loc --      Pain Education --      Exclude from Growth Chart --     Most recent vital signs: Vitals:   12/27/23 1532  BP: (!) 141/98  Pulse: (!) 107  Resp: 18  Temp: 98.2 F (36.8 C)  SpO2: 99%     General: Awake, no distress.  CV:  Good peripheral perfusion.  No chest wall tenderness palpation Resp:  Normal effort.  Abd:  No distention.  Soft, nontender Other:  No vertebral tenderness to palpation, no evidence of head trauma, no cervical discomfort  Mild discomfort with abducting at the right shoulder, some discomfort with range of motion of the right wrist and right ankle.  Some pain with axial load on the right hip   ED Results / Procedures / Treatments   Labs (all labs ordered are listed, but only abnormal results are displayed) Labs Reviewed  - No data to display   EKG     RADIOLOGY Hip x-ray viewed interpret by me, no fracture, confirmed by radiology Right shoulder right wrist and right ankle x-rays are negative   PROCEDURES:  Critical Care performed:   Procedures   MEDICATIONS ORDERED IN ED: Medications  ketorolac  (TORADOL ) 30 MG/ML injection 30 mg (30 mg Intramuscular Given 12/27/23 1633)     IMPRESSION / MDM / ASSESSMENT AND PLAN / ED COURSE  I reviewed the triage vital signs and the nursing notes. Patient's presentation is most consistent with acute presentation with potential threat to life or bodily function.  Patient presents after fall down stairs, reportedly fell down 15 stairs.  No abdominal pain, no chest pain, no back pain, no head injury or neck pain.  Primarily pain in the right arm and right leg/right hip  Treated with IM Toradol .  X-rays obtained which are overall quite reassuring, no evidence of fractures.  She is reassured by this, no indication  for admission or further imaging at this time, appropriate for discharge, she agrees with this plan        FINAL CLINICAL IMPRESSION(S) / ED DIAGNOSES   Final diagnoses:  Fall, initial encounter  Contusion of right wrist, initial encounter  Acute pain of right shoulder     Rx / DC Orders   ED Discharge Orders     None        Note:  This document was prepared using Dragon voice recognition software and may include unintentional dictation errors.   Arlander Charleston, MD 12/27/23 [phone]

## 2023-12-27 NOTE — ED Triage Notes (Signed)
 Fall down a flight of stairs. Pt sts that she had a stroke and is not as strong as she use to be and fell.

## 2024-02-01 ENCOUNTER — Telehealth: Payer: Self-pay | Admitting: Adult Health

## 2024-02-01 NOTE — Telephone Encounter (Signed)
 MYC cxl

## 2024-02-02 NOTE — Telephone Encounter (Signed)
 Patient rescheduled appointment due to provider will be out.

## 2024-04-08 ENCOUNTER — Ambulatory Visit: Admitting: Adult Health

## 2024-04-19 ENCOUNTER — Ambulatory Visit: Admitting: Adult Health
